# Patient Record
Sex: Female | Born: 1960 | Race: Black or African American | Hispanic: No | Smoking: Current every day smoker
Health system: Southern US, Community
[De-identification: ages and names within clinical notes are randomized; demographics above are authoritative.]

## PROBLEM LIST (undated history)

## (undated) DIAGNOSIS — Z720 Tobacco use: Secondary | ICD-10-CM

## (undated) DIAGNOSIS — I10 Essential (primary) hypertension: Secondary | ICD-10-CM

## (undated) DIAGNOSIS — K219 Gastro-esophageal reflux disease without esophagitis: Secondary | ICD-10-CM

## (undated) DIAGNOSIS — Z98891 History of uterine scar from previous surgery: Secondary | ICD-10-CM

## (undated) DIAGNOSIS — I2510 Atherosclerotic heart disease of native coronary artery without angina pectoris: Secondary | ICD-10-CM

---

## 1998-02-23 ENCOUNTER — Emergency Department (HOSPITAL_COMMUNITY): Admission: EM | Admit: 1998-02-23 | Discharge: 1998-02-23 | Payer: Self-pay | Admitting: Emergency Medicine

## 1999-07-18 ENCOUNTER — Ambulatory Visit (HOSPITAL_COMMUNITY): Admission: RE | Admit: 1999-07-18 | Discharge: 1999-07-18 | Payer: Self-pay | Admitting: Sports Medicine

## 1999-07-18 ENCOUNTER — Encounter: Payer: Self-pay | Admitting: Sports Medicine

## 2000-02-27 ENCOUNTER — Ambulatory Visit (HOSPITAL_COMMUNITY): Admission: RE | Admit: 2000-02-27 | Discharge: 2000-02-27 | Payer: Self-pay | Admitting: Sports Medicine

## 2000-02-27 ENCOUNTER — Encounter: Payer: Self-pay | Admitting: Sports Medicine

## 2000-07-26 ENCOUNTER — Emergency Department (HOSPITAL_COMMUNITY): Admission: EM | Admit: 2000-07-26 | Discharge: 2000-07-26 | Payer: Self-pay | Admitting: Emergency Medicine

## 2000-07-28 ENCOUNTER — Emergency Department (HOSPITAL_COMMUNITY): Admission: EM | Admit: 2000-07-28 | Discharge: 2000-07-28 | Payer: Self-pay | Admitting: Emergency Medicine

## 2001-02-03 ENCOUNTER — Emergency Department (HOSPITAL_COMMUNITY): Admission: EM | Admit: 2001-02-03 | Discharge: 2001-02-04 | Payer: Self-pay | Admitting: Emergency Medicine

## 2002-07-08 ENCOUNTER — Encounter: Payer: Self-pay | Admitting: Neurosurgery

## 2002-07-08 ENCOUNTER — Ambulatory Visit (HOSPITAL_COMMUNITY): Admission: RE | Admit: 2002-07-08 | Discharge: 2002-07-08 | Payer: Self-pay | Admitting: Neurosurgery

## 2002-08-06 ENCOUNTER — Emergency Department (HOSPITAL_COMMUNITY): Admission: EM | Admit: 2002-08-06 | Discharge: 2002-08-06 | Payer: Self-pay | Admitting: Emergency Medicine

## 2002-11-03 ENCOUNTER — Emergency Department (HOSPITAL_COMMUNITY): Admission: EM | Admit: 2002-11-03 | Discharge: 2002-11-03 | Payer: Self-pay | Admitting: Emergency Medicine

## 2002-11-12 ENCOUNTER — Other Ambulatory Visit: Admission: RE | Admit: 2002-11-12 | Discharge: 2002-11-12 | Payer: Self-pay | Admitting: Nephrology

## 2002-12-01 ENCOUNTER — Encounter: Payer: Self-pay | Admitting: Nephrology

## 2002-12-01 ENCOUNTER — Encounter: Admission: RE | Admit: 2002-12-01 | Discharge: 2002-12-01 | Payer: Self-pay | Admitting: Nephrology

## 2003-08-13 ENCOUNTER — Other Ambulatory Visit: Payer: Self-pay

## 2006-01-25 ENCOUNTER — Emergency Department: Payer: Self-pay | Admitting: Emergency Medicine

## 2006-08-24 ENCOUNTER — Emergency Department: Payer: Self-pay | Admitting: Emergency Medicine

## 2007-01-13 ENCOUNTER — Emergency Department: Payer: Self-pay | Admitting: Emergency Medicine

## 2008-08-13 ENCOUNTER — Emergency Department: Payer: Self-pay | Admitting: Unknown Physician Specialty

## 2009-03-10 ENCOUNTER — Emergency Department: Payer: Self-pay | Admitting: Emergency Medicine

## 2009-09-05 ENCOUNTER — Emergency Department (HOSPITAL_COMMUNITY): Admission: EM | Admit: 2009-09-05 | Discharge: 2009-09-05 | Payer: Self-pay | Admitting: Emergency Medicine

## 2010-03-07 ENCOUNTER — Emergency Department: Payer: Self-pay | Admitting: Emergency Medicine

## 2011-01-10 LAB — URINALYSIS, ROUTINE W REFLEX MICROSCOPIC
Bilirubin Urine: NEGATIVE
Glucose, UA: NEGATIVE mg/dL
Hgb urine dipstick: NEGATIVE
Ketones, ur: NEGATIVE mg/dL
Nitrite: NEGATIVE
Protein, ur: NEGATIVE mg/dL
Specific Gravity, Urine: 1.019 (ref 1.005–1.030)
Urobilinogen, UA: 0.2 mg/dL (ref 0.0–1.0)
pH: 5.5 (ref 5.0–8.0)

## 2011-01-10 LAB — BASIC METABOLIC PANEL
BUN: 9 mg/dL (ref 6–23)
CO2: 25 mEq/L (ref 19–32)
Chloride: 106 mEq/L (ref 96–112)
Creatinine, Ser: 0.9 mg/dL (ref 0.4–1.2)

## 2011-01-10 LAB — URINE CULTURE
Colony Count: NO GROWTH
Culture: NO GROWTH

## 2011-01-10 LAB — DIFFERENTIAL
Basophils Relative: 1 % (ref 0–1)
Eosinophils Absolute: 0.1 10*3/uL (ref 0.0–0.7)
Neutrophils Relative %: 64 % (ref 43–77)

## 2011-01-10 LAB — CBC
MCHC: 33.8 g/dL (ref 30.0–36.0)
MCV: 92.9 fL (ref 78.0–100.0)
Platelets: 265 10*3/uL (ref 150–400)
RDW: 14.4 % (ref 11.5–15.5)

## 2011-01-10 LAB — POCT CARDIAC MARKERS: Myoglobin, poc: 58.2 ng/mL (ref 12–200)

## 2011-06-07 ENCOUNTER — Emergency Department: Payer: Self-pay | Admitting: Emergency Medicine

## 2011-11-27 ENCOUNTER — Ambulatory Visit: Payer: Self-pay

## 2011-12-05 ENCOUNTER — Ambulatory Visit: Payer: Self-pay

## 2013-03-01 ENCOUNTER — Emergency Department: Payer: Self-pay | Admitting: Emergency Medicine

## 2013-07-07 ENCOUNTER — Emergency Department: Payer: Self-pay | Admitting: Emergency Medicine

## 2013-07-07 LAB — COMPREHENSIVE METABOLIC PANEL
Anion Gap: 6 — ABNORMAL LOW (ref 7–16)
Bilirubin,Total: 0.3 mg/dL (ref 0.2–1.0)
Calcium, Total: 8.7 mg/dL (ref 8.5–10.1)
Chloride: 107 mmol/L (ref 98–107)
EGFR (African American): >60
Glucose: 81 mg/dL (ref 65–99)
SGOT(AST): 24 U/L (ref 15–37)
SGPT (ALT): 22 U/L (ref 12–78)
Sodium: 138 mmol/L (ref 136–145)
Total Protein: 7 g/dL (ref 6.4–8.2)

## 2013-07-07 LAB — CK TOTAL AND CKMB (NOT AT ARMC): CK, Total: 159 U/L (ref 21–215)

## 2013-07-07 LAB — CBC
HGB: 14.7 g/dL (ref 12.0–16.0)
RBC: 4.71 10*6/uL (ref 3.80–5.20)
RDW: 13.6 % (ref 11.5–14.5)
WBC: 4.6 10*3/uL (ref 3.6–11.0)

## 2013-07-07 LAB — TROPONIN I: Troponin-I: <0.02 ng/mL

## 2014-03-01 ENCOUNTER — Emergency Department: Payer: Self-pay | Admitting: Emergency Medicine

## 2014-11-12 ENCOUNTER — Emergency Department: Payer: Self-pay | Admitting: Internal Medicine

## 2015-04-12 DIAGNOSIS — F17200 Nicotine dependence, unspecified, uncomplicated: Secondary | ICD-10-CM | POA: Insufficient documentation

## 2015-07-18 ENCOUNTER — Ambulatory Visit: Payer: Self-pay | Attending: Oncology

## 2015-11-12 ENCOUNTER — Emergency Department
Admission: EM | Admit: 2015-11-12 | Discharge: 2015-11-12 | Disposition: A | Discharge status: Home / Self Care | Payer: Self-pay | Attending: Emergency Medicine | Admitting: Emergency Medicine

## 2015-11-12 ENCOUNTER — Encounter: Payer: Self-pay | Admitting: Emergency Medicine

## 2015-11-12 DIAGNOSIS — I10 Essential (primary) hypertension: Secondary | ICD-10-CM | POA: Insufficient documentation

## 2015-11-12 DIAGNOSIS — J111 Influenza due to unidentified influenza virus with other respiratory manifestations: Secondary | ICD-10-CM | POA: Insufficient documentation

## 2015-11-12 DIAGNOSIS — F17200 Nicotine dependence, unspecified, uncomplicated: Secondary | ICD-10-CM | POA: Insufficient documentation

## 2015-11-12 HISTORY — DX: Essential (primary) hypertension: I10

## 2015-11-12 LAB — RAPID INFLUENZA A&B ANTIGENS (ARMC ONLY)
INFLUENZA A (ARMC): NOT DETECTED
INFLUENZA B (ARMC): NOT DETECTED

## 2015-11-12 MED ORDER — OSELTAMIVIR PHOSPHATE 75 MG PO CAPS: 75.0000 mg | ORAL_CAPSULE | Freq: Two times a day (BID) | ORAL | Status: AC

## 2015-11-12 MED ORDER — OSELTAMIVIR PHOSPHATE 75 MG PO CAPS
75.0000 mg | ORAL_CAPSULE | Freq: Once | ORAL | Status: AC
  Administered 2015-11-12: 75 mg via ORAL
  Filled 2015-11-12: qty 1

## 2015-11-12 MED ORDER — IBUPROFEN 800 MG PO TABS
800.0000 mg | ORAL_TABLET | Freq: Once | ORAL | Status: AC
  Administered 2015-11-12: 800 mg via ORAL
  Filled 2015-11-12: qty 1

## 2015-11-12 MED ORDER — HYDROCODONE-HOMATROPINE 5-1.5 MG/5ML PO SYRP: 5.0000 mL | ORAL_SOLUTION | Freq: Four times a day (QID) | ORAL | Status: DC | PRN

## 2015-11-12 MED ORDER — LISINOPRIL 10 MG PO TABS
20.0000 mg | ORAL_TABLET | Freq: Once | ORAL | Status: AC
  Administered 2015-11-12: 20 mg via ORAL
  Filled 2015-11-12: qty 2

## 2015-11-12 NOTE — Discharge Instructions (Signed)
Influenza, Adult Influenza ("the flu") is a viral infection of the respiratory tract. It occurs more often in winter months because people spend more time in close contact with one another. Influenza can make you feel very sick. Influenza easily spreads from person to person (contagious). CAUSES  Influenza is caused by a virus that infects the respiratory tract. You can catch the virus by breathing in droplets from an infected person's cough or sneeze. You can also catch the virus by touching something that was recently contaminated with the virus and then touching your mouth, nose, or eyes. RISKS AND COMPLICATIONS You may be at risk for a more severe case of influenza if you smoke cigarettes, have diabetes, have chronic heart disease (such as heart failure) or lung disease (such as asthma), or if you have a weakened immune system. Elderly people and pregnant women are also at risk for more serious infections. The most common problem of influenza is a lung infection (pneumonia). Sometimes, this problem can require emergency medical care and may be life threatening. SIGNS AND SYMPTOMS  Symptoms typically last 4 to 10 days and may include:  Fever.  Chills.  Headache, body aches, and muscle aches.  Sore throat.  Chest discomfort and cough.  Poor appetite.  Weakness or feeling tired.  Dizziness.  Nausea or vomiting. DIAGNOSIS  Diagnosis of influenza is often made based on your history and a physical exam. A nose or throat swab test can be done to confirm the diagnosis. TREATMENT  In mild cases, influenza goes away on its own. Treatment is directed at relieving symptoms. For more severe cases, your health care provider may prescribe antiviral medicines to shorten the sickness. Antibiotic medicines are not effective because the infection is caused by a virus, not by bacteria. HOME CARE INSTRUCTIONS  Take medicines only as directed by your health care provider.  Use a cool mist humidifier  to make breathing easier.  Get plenty of rest until your temperature returns to normal. This usually takes 3 to 4 days.  Drink enough fluid to keep your urine clear or pale yellow.  Cover yourmouth and nosewhen coughing or sneezing,and wash your handswellto prevent thevirusfrom spreading.  Stay homefromwork orschool untilthe fever is gonefor at least 29full day. PREVENTION  An annual influenza vaccination (flu shot) is the best way to avoid getting influenza. An annual flu shot is now routinely recommended for all adults in the [place] SEEK MEDICAL CARE IF:  You experiencechest pain, yourcough worsens,or you producemore mucus.  Youhave nausea,vomiting, ordiarrhea.  Your fever returns or gets worse. SEEK IMMEDIATE MEDICAL CARE IF:  You havetrouble breathing, you become short of breath,or your skin ornails becomebluish.  You have severe painor stiffnessin the neck.  You develop a sudden headache, or pain in the face or ear.  You have nausea or vomiting that you cannot control. MAKE SURE YOU:   Understand these instructions.  Will watch your condition.  Will get help right away if you are not doing well or get worse.   This information is not intended to replace advice given to you by your health care provider. Make sure you discuss any questions you have with your health care provider.   Document Released: 09/21/2000 Document Revised: 10/15/2014 Document Reviewed: 12/24/2011 Elsevier Interactive Patient Education 2016 [employer].  Hypertension Hypertension, commonly called high blood pressure, is when the force of blood pumping through your arteries is too strong. Your arteries are the blood vessels that carry blood from your heart throughout your  body. A blood pressure reading consists of a higher number over a lower number, such as 110/72. The higher number (systolic) is the pressure inside your arteries when your heart pumps. The lower number  (diastolic) is the pressure inside your arteries when your heart relaxes. Ideally you want your blood pressure below 120/80. Hypertension forces your heart to work harder to pump blood. Your arteries may become narrow or stiff. Having untreated or uncontrolled hypertension can cause heart attack, stroke, kidney disease, and other problems. RISK FACTORS Some risk factors for high blood pressure are controllable. Others are not.  Risk factors you cannot control include:   Race. You may be at higher risk if you are African American.  Age. Risk increases with age.  Gender. Men are at higher risk than women before age 4 years. After age 41, women are at higher risk than men. Risk factors you can control include:  Not getting enough exercise or physical activity.  Being overweight.  Getting too much fat, sugar, calories, or salt in your diet.  Drinking too much alcohol. SIGNS AND SYMPTOMS Hypertension does not usually cause signs or symptoms. Extremely high blood pressure (hypertensive crisis) may cause headache, anxiety, shortness of breath, and nosebleed. DIAGNOSIS To check if you have hypertension, your health care provider will measure your blood pressure while you are seated, with your arm held at the level of your heart. It should be measured at least twice using the same arm. Certain conditions can cause a difference in blood pressure between your right and left arms. A blood pressure reading that is higher than normal on one occasion does not mean that you need treatment. If it is not clear whether you have high blood pressure, you may be asked to return on a different day to have your blood pressure checked again. Or, you may be asked to monitor your blood pressure at home for 1 or more weeks. TREATMENT Treating high blood pressure includes making lifestyle changes and possibly taking medicine. Living a healthy lifestyle can help lower high blood pressure. You may need to change some of  your habits. Lifestyle changes may include:  Following the DASH diet. This diet is high in fruits, vegetables, and whole grains. It is low in salt, red meat, and added sugars.  Keep your sodium intake below 2,300 mg per day.  Getting at least 30-45 minutes of aerobic exercise at least 4 times per week.  Losing weight if necessary.  Not smoking.  Limiting alcoholic beverages.  Learning ways to reduce stress. Your health care provider may prescribe medicine if lifestyle changes are not enough to get your blood pressure under control, and if one of the following is true:  You are 68-72 years of age and your systolic blood pressure is above 140.  You are 18 years of age or older, and your systolic blood pressure is above 150.  Your diastolic blood pressure is above 90.  You have diabetes, and your systolic blood pressure is over 140 or your diastolic blood pressure is over 90.  You have kidney disease and your blood pressure is above 140/90.  You have heart disease and your blood pressure is above 140/90. Your personal target blood pressure may vary depending on your medical conditions, your age, and other factors. HOME CARE INSTRUCTIONS  Have your blood pressure rechecked as directed by your health care provider.   Take medicines only as directed by your health care provider. Follow the directions carefully. Blood pressure medicines must  be taken as prescribed. The medicine does not work as well when you skip doses. Skipping doses also puts you at risk for problems.  Do not smoke.   Monitor your blood pressure at home as directed by your health care provider. SEEK MEDICAL CARE IF:   You think you are having a reaction to medicines taken.  You have recurrent headaches or feel dizzy.  You have swelling in your ankles.  You have trouble with your vision. SEEK IMMEDIATE MEDICAL CARE IF:  You develop a severe headache or confusion.  You have unusual weakness, numbness,  or feel faint.  You have severe chest or abdominal pain.  You vomit repeatedly.  You have trouble breathing. MAKE SURE YOU:   Understand these instructions.  Will watch your condition.  Will get help right away if you are not doing well or get worse.   This information is not intended to replace advice given to you by your health care provider. Make sure you discuss any questions you have with your health care provider.   Document Released: 09/24/2005 Document Revised: 02/08/2015 Document Reviewed: 07/17/2013 Elsevier Interactive Patient Education 2016 [employer]   Continue fluid medicine as directed. You may use cough syrup as needed. Use ibuprofen for fever and headache. Follow-up with your physician next week if not improving or return to the emergency room for any worsening symptoms.

## 2015-11-12 NOTE — ED Provider Notes (Signed)
[facility] Emergency Department Provider Note  ____________________________________________  Time seen: Approximately 3:45 PM  I have reviewed the triage vital signs and the nursing notes.   HISTORY  Chief Complaint Nasal Congestion; Generalized Body Aches; and Cough    HPI DACIA CAPERS is a 55 y.o. female with history of hypertension who presents with 3 days of myalgias, fever, congestion and cough, as well as a headache. Occasional nausea but no vomiting or abdominal pain. She has taken her blood pressure medication. She goes to [facility].   Past Medical History  Diagnosis Date  . Hypertension     There are no active problems to display for this patient.   History reviewed. No pertinent past surgical history.  Current Outpatient Rx  Name  Route  Sig  Dispense  Refill  . HYDROcodone-homatropine (HYCODAN) 5-1.5 MG/5ML syrup   Oral   Take 5 mLs by mouth every 6 (six) hours as needed for cough.   120 mL   0   . oseltamivir (TAMIFLU) 75 MG capsule   Oral   Take 1 capsule (75 mg total) by mouth 2 (two) times daily.   10 capsule   0     Allergies Review of patient's allergies indicates no known allergies.  History reviewed. No pertinent family history.  Social History Social History  Substance Use Topics  . Smoking status: Current Every Day Smoker  . Smokeless tobacco: None  . Alcohol Use: No    Review of Systems Constitutional: No fever/chills Eyes: No visual changes. ENT: per  HPI Cardiovascular: Denies chest pain. Respiratory: MILD shortness of breath. Gastrointestinal: No abdominal pain. no vomiting.  No diarrhea.  No constipation. Genitourinary: Negative for dysuria. Musculoskeletal: Negative for back pain. Skin: Negative for rash. Neurological: Negative for focal weakness or numbness. 10-point ROS otherwise negative.  ____________________________________________   PHYSICAL EXAM:  VITAL SIGNS: ED  Triage Vitals  Enc Vitals Group     BP 11/12/15 1431 174/120 mmHg     Pulse Rate 11/12/15 1431 86     Resp 11/12/15 1431 20     Temp 11/12/15 1431 98.2 F (36.8 C)     Temp Source 11/12/15 1431 Oral     SpO2 11/12/15 1431 99 %     Weight 11/12/15 1431 130 lb (58.968 kg)     Height 11/12/15 1431  (1.6 m)     Head Cir --      Peak Flow --      Pain Score 11/12/15 1432 8     Pain Loc --      Pain Edu? --      Excl. in GC? --     Constitutional: Alert and oriented. Well appearing and in no acute distress. Eyes: Conjunctivae are normal. PERRL. EOMI. Ears:  Clear with normal landmarks. No erythema. Head: Atraumatic. Nose: No congestion/rhinnorhea. Mouth/Throat: Mucous membranes are moist.  Oropharynx non-erythematous. No lesions. Neck:  Supple.  No adenopathy.   Cardiovascular: Normal rate, regular rhythm. Grossly normal heart sounds.  Good peripheral circulation. Respiratory: Normal respiratory effort.  No retractions. Lungs CTAB. Gastrointestinal: Soft and nontender. No distention. No abdominal bruits. No CVA tenderness. Musculoskeletal: Nml ROM of upper and lower extremity joints. Neurologic:  Normal speech and language. No gross focal neurologic deficits are appreciated. No gait instability. Skin:  Skin is warm, dry and intact. No rash noted. Psychiatric: Mood and affect are normal. Speech and behavior are normal.  ____________________________________________   LABS (all labs ordered are  listed, but only abnormal results are displayed)  Labs Reviewed  RAPID INFLUENZA A&B ANTIGENS ([facility] ONLY)   ____________________________________________  EKG   ____________________________________________  RADIOLOGY    ____________________________________________   PROCEDURES  Procedure(s) performed: None  Critical Care performed: No  ____________________________________________   INITIAL IMPRESSION / ASSESSMENT AND PLAN / ED COURSE  Pertinent labs & imaging  results that were available during my care of the patient were reviewed by me and considered in my medical decision making (see chart for details).  55 year old female who presents with flulike symptoms for the last 3 days. The rapid flu test was negative though suspicion is still high and we'll treat with Tamiflu, ibuprofen. She also has hypertension and takes blood pressure medicine at home, lisinopril. She is given an extra dose of this while in the ER. She can follow-up with her primary physician or return to the emergency room for any worsening symptoms. ____________________________________________   FINAL CLINICAL IMPRESSION(S) / ED DIAGNOSES  Final diagnoses:  Influenza  Essential hypertension      Ignacia Bayley, PA-C 11/12/15 1701  Ignacia Bayley, PA-C 11/12/15 1725  Sharyn Creamer, MD 11/12/15 2253

## 2015-11-12 NOTE — ED Notes (Signed)
Pt to ed with c/o congestion, cough, headache, earache and body aches x 3 days.

## 2016-03-10 ENCOUNTER — Emergency Department: Payer: Self-pay

## 2016-03-10 ENCOUNTER — Encounter: Payer: Self-pay | Admitting: Emergency Medicine

## 2016-03-10 ENCOUNTER — Emergency Department
Admission: EM | Admit: 2016-03-10 | Discharge: 2016-03-10 | Disposition: A | Discharge status: Home / Self Care | Payer: Self-pay | Attending: Emergency Medicine | Admitting: Emergency Medicine

## 2016-03-10 DIAGNOSIS — R079 Chest pain, unspecified: Secondary | ICD-10-CM

## 2016-03-10 DIAGNOSIS — I871 Compression of vein: Secondary | ICD-10-CM | POA: Insufficient documentation

## 2016-03-10 DIAGNOSIS — I10 Essential (primary) hypertension: Secondary | ICD-10-CM | POA: Insufficient documentation

## 2016-03-10 DIAGNOSIS — F17200 Nicotine dependence, unspecified, uncomplicated: Secondary | ICD-10-CM | POA: Insufficient documentation

## 2016-03-10 LAB — CBC
HCT: 43.5 % (ref 35.0–47.0)
HEMOGLOBIN: 14.7 g/dL (ref 12.0–16.0)
MCH: 30.4 pg (ref 26.0–34.0)
MCHC: 33.7 g/dL (ref 32.0–36.0)
MCV: 90.3 fL (ref 80.0–100.0)
Platelets: 271 10*3/uL (ref 150–440)
RBC: 4.82 MIL/uL (ref 3.80–5.20)
RDW: 14.1 % (ref 11.5–14.5)
WBC: 3.8 10*3/uL (ref 3.6–11.0)

## 2016-03-10 LAB — BASIC METABOLIC PANEL
ANION GAP: 9 (ref 5–15)
BUN: 11 mg/dL (ref 6–20)
CALCIUM: 9.5 mg/dL (ref 8.9–10.3)
CO2: 24 mmol/L (ref 22–32)
Chloride: 106 mmol/L (ref 101–111)
Creatinine, Ser: 0.81 mg/dL (ref 0.44–1.00)
GLUCOSE: 103 mg/dL — AB (ref 65–99)
Potassium: 3.1 mmol/L — ABNORMAL LOW (ref 3.5–5.1)
Sodium: 139 mmol/L (ref 135–145)

## 2016-03-10 LAB — TROPONIN I: Troponin I: <0.03 ng/mL

## 2016-03-10 MED ORDER — ASPIRIN 81 MG PO CHEW
324.0000 mg | CHEWABLE_TABLET | Freq: Once | ORAL | Status: AC
  Administered 2016-03-10: 324 mg via ORAL
  Filled 2016-03-10: qty 4

## 2016-03-10 MED ORDER — TRAMADOL HCL 50 MG PO TABS: 50.0000 mg | ORAL_TABLET | Freq: Four times a day (QID) | ORAL | Status: AC | PRN

## 2016-03-10 MED ORDER — ASPIRIN EC 325 MG PO TBEC: 325.0000 mg | DELAYED_RELEASE_TABLET | Freq: Every day | ORAL | Status: DC

## 2016-03-10 MED ORDER — IOPAMIDOL (ISOVUE-370) INJECTION 76%
75.0000 mL | Freq: Once | INTRAVENOUS | Status: AC | PRN
  Administered 2016-03-10: 75 mL via INTRAVENOUS

## 2016-03-10 NOTE — ED Notes (Signed)
Discussed discharge instructions, prescriptions, and follow-up care with patient. No questions or concerns at this time. Pt stable at discharge.  

## 2016-03-10 NOTE — ED Provider Notes (Signed)
[facility] Emergency Department Provider Note        Time seen: ----------------------------------------- 5:30 PM on 03/10/2016 -----------------------------------------    I have reviewed the triage vital signs and the nursing notes.   HISTORY  Chief Complaint Chest Pain    HPI Robin Mckinney is a 55 y.o. female who presents to ER for back pain radiating into her chest and down her left arm for 2 weeks. Patient states pain is intermittent but has been worse today. She denies any shortness of breath. States that might be a muscle spasm but she wasn't sure. She denies fevers chills or other complaints, has never had this happen before. Denies any recent injuries or trauma.   Past Medical History  Diagnosis Date  . Hypertension     There are no active problems to display for this patient.   History reviewed. No pertinent past surgical history.  Allergies Review of patient's allergies indicates no known allergies.  Social History Social History  Substance Use Topics  . Smoking status: Current Every Day Smoker  . Smokeless tobacco: None  . Alcohol Use: No    Review of Systems Constitutional: Negative for fever. Eyes: Negative for visual changes. ENT: Negative for sore throat. Cardiovascular: Positive for chest pain Respiratory: Negative for shortness of breath. Gastrointestinal: Negative for abdominal pain, vomiting and diarrhea. Genitourinary: Negative for dysuria. Musculoskeletal: Positive for upper back pain Skin: Negative for rash. Neurological: Negative for headaches, focal weakness or numbness.  10-point ROS otherwise negative.  ____________________________________________   PHYSICAL EXAM:  VITAL SIGNS: ED Triage Vitals  Enc Vitals Group     BP 03/10/16 1341 153/118 mmHg     Pulse Rate 03/10/16 1341 75     Resp 03/10/16 1341 18     Temp 03/10/16 1341 98.2 F (36.8 C)     Temp Source 03/10/16 1341 Oral     SpO2  03/10/16 1341 97 %     Weight 03/10/16 1341 119 lb (53.978 kg)     Height 03/10/16 1341  (1.575 m)     Head Cir --      Peak Flow --      Pain Score --      Pain Loc --      Pain Edu? --      Excl. in GC? --     Constitutional: Alert and oriented. Well appearing and in no distress. Eyes: Conjunctivae are normal. PERRL. Normal extraocular movements. ENT   Head: Normocephalic and atraumatic.   Nose: No congestion/rhinnorhea.   Mouth/Throat: Mucous membranes are moist.   Neck: No stridor. Cardiovascular: Normal rate, regular rhythm. No murmurs, rubs, or gallops. Respiratory: Normal respiratory effort without tachypnea nor retractions. Breath sounds are clear and equal bilaterally. No wheezes/rales/rhonchi. Gastrointestinal: Soft and nontender. Normal bowel sounds Musculoskeletal: Nontender with normal range of motion in all extremities. No lower extremity tenderness nor edema. Neurologic:  Normal speech and language. No gross focal neurologic deficits are appreciated.  Skin:  Skin is warm, dry and intact. No rash noted. Psychiatric: Mood and affect are normal. Speech and behavior are normal.  ____________________________________________  EKG: Interpreted by me. Sinus rhythm with a rate of 76 bpm, normal PR interval, normal QRS, normal QT interval, left axis deviation, possible LVH.  ____________________________________________  ED COURSE:  Pertinent labs & imaging results that were available during my care of the patient were reviewed by me and considered in my medical decision making (see chart for details). Patient is in no  acute distress, will check cardiac labs and reevaluate.  ____________________________________________    LABS (pertinent positives/negatives)  Labs Reviewed  BASIC METABOLIC PANEL - Abnormal; Notable for the following:    Potassium 3.1 (*)    Glucose, Bld 103 (*)    All other components within normal limits  CBC  TROPONIN I  TROPONIN I     RADIOLOGY Images were viewed by me  Chest x-ray, CT angiogram of the chest IMPRESSION: No active cardiopulmonary disease.  IMPRESSION: No evidence of pulmonary embolus.  Mild cardiomegaly.  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  No acute findings. ____________________________________________  FINAL ASSESSMENT AND PLAN  Chest pain  Plan: Patient with labs and imaging as dictated above. Patient presented to the ER with left-sided chest pain of uncertain etiology. 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 I will place her on an aspirin daily and referred to vascular surgery for follow-up. There is no arterial occlusion or thrombosis. She is stable for outpatient follow-up.   Emily FilbertWilliams, Jonathan E, MD   Note: This dictation was prepared with Dragon dictation. Any transcriptional errors that result from this process are unintentional   Emily FilbertJonathan E Williams, MD 03/10/16 1759

## 2016-03-10 NOTE — ED Notes (Signed)
Patient presents to the ED with back pain radiating into patient's chest and down left arm x 2 weeks.  Patient states pain is intermittent but has been worse today.  Patient denies shortness of breath.  Patient states, I thought it might be a muscle spasm but I don't know."  Patient is in no obvious distress at this time.

## 2016-03-10 NOTE — Discharge Instructions (Signed)
Nonspecific Chest Pain  °Chest pain can be caused by many different conditions. There is always a chance that your pain could be related to something serious, such as a heart attack or a blood clot in your lungs. Chest pain can also be caused by conditions that are not life-threatening. If you have chest pain, it is very important to follow up with your health care provider. °CAUSES  °Chest pain can be caused by: °· Heartburn. °· Pneumonia or bronchitis. °· Anxiety or stress. °· Inflammation around your heart (pericarditis) or lung (pleuritis or pleurisy). °· A blood clot in your lung. °· A collapsed lung (pneumothorax). It can develop suddenly on its own (spontaneous pneumothorax) or from trauma to the chest. °· Shingles infection (varicella-zoster virus). °· Heart attack. °· Damage to the bones, muscles, and cartilage that make up your chest wall. This can include: °¨ Bruised bones due to injury. °¨ Strained muscles or cartilage due to frequent or repeated coughing or overwork. °¨ Fracture to one or more ribs. °¨ Sore cartilage due to inflammation (costochondritis). °RISK FACTORS  °Risk factors for chest pain may include: °· Activities that increase your risk for trauma or injury to your chest. °· Respiratory infections or conditions that cause frequent coughing. °· Medical conditions or overeating that can cause heartburn. °· Heart disease or family history of heart disease. °· Conditions or health behaviors that increase your risk of developing a blood clot. °· Having had chicken pox (varicella zoster). °SIGNS AND SYMPTOMS °Chest pain can feel like: °· Burning or tingling on the surface of your chest or deep in your chest. °· Crushing, pressure, aching, or squeezing pain. °· Dull or sharp pain that is worse when you move, cough, or take a deep breath. °· Pain that is also felt in your back, neck, shoulder, or arm, or pain that spreads to any of these areas. °Your chest pain may come and go, or it may stay  constant. °DIAGNOSIS °Lab tests or other studies may be needed to find the cause of your pain. Your health care provider may have you take a test called an ambulatory ECG (electrocardiogram). An ECG records your heartbeat patterns at the time the test is performed. You may also have other tests, such as: °· Transthoracic echocardiogram (TTE). During echocardiography, sound waves are used to create a picture of all of the heart structures and to look at how blood flows through your heart. °· Transesophageal echocardiogram (TEE). This is a more advanced imaging test that obtains images from inside your body. It allows your health care provider to see your heart in finer detail. °· Cardiac monitoring. This allows your health care provider to monitor your heart rate and rhythm in real time. °· Holter monitor. This is a portable device that records your heartbeat and can help to diagnose abnormal heartbeats. It allows your health care provider to track your heart activity for several days, if needed. °· Stress tests. These can be done through exercise or by taking medicine that makes your heart beat more quickly. °· Blood tests. °· Imaging tests. °TREATMENT  °Your treatment depends on what is causing your chest pain. Treatment may include: °· Medicines. These may include: °¨ Acid blockers for heartburn. °¨ Anti-inflammatory medicine. °¨ Pain medicine for inflammatory conditions. °¨ Antibiotic medicine, if an infection is present. °¨ Medicines to dissolve blood clots. °¨ Medicines to treat coronary artery disease. °· Supportive care for conditions that do not require medicines. This may include: °¨ Resting. °¨ Applying heat   or cold packs to injured areas. °¨ Limiting activities until pain decreases. °HOME CARE INSTRUCTIONS °· If you were prescribed an antibiotic medicine, finish it all even if you start to feel better. °· Avoid any activities that bring on chest pain. °· Do not use any tobacco products, including  cigarettes, chewing tobacco, or electronic cigarettes. If you need help quitting, ask your health care provider. °· Do not drink alcohol. °· Take medicines only as directed by your health care provider. °· Keep all follow-up visits as directed by your health care provider. This is important. This includes any further testing if your chest pain does not go away. °· If heartburn is the cause for your chest pain, you may be told to keep your head raised (elevated) while sleeping. This reduces the chance that acid will go from your stomach into your esophagus. °· Make lifestyle changes as directed by your health care provider. These may include: °¨ Getting regular exercise. Ask your health care provider to suggest some activities that are safe for you. °¨ Eating a heart-healthy diet. A registered dietitian can help you to learn healthy eating options. °¨ Maintaining a healthy weight. °¨ Managing diabetes, if necessary. °¨ Reducing stress. °SEEK MEDICAL CARE IF: °· Your chest pain does not go away after treatment. °· You have a rash with blisters on your chest. °· You have a fever. °SEEK IMMEDIATE MEDICAL CARE IF:  °· Your chest pain is worse. °· You have an increasing cough, or you cough up blood. °· You have severe abdominal pain. °· You have severe weakness. °· You faint. °· You have chills. °· You have sudden, unexplained chest discomfort. °· You have sudden, unexplained discomfort in your arms, back, neck, or jaw. °· You have shortness of breath at any time. °· You suddenly start to sweat, or your skin gets clammy. °· You feel nauseous or you vomit. °· You suddenly feel light-headed or dizzy. °· Your heart begins to beat quickly, or it feels like it is skipping beats. °These symptoms may represent a serious problem that is an emergency. Do not wait to see if the symptoms will go away. Get medical help right away. Call your local emergency services (911 in the [place] Do not drive yourself to the hospital. °  °This  information is not intended to replace advice given to you by your health care provider. Make sure you discuss any questions you have with your health care provider. °  °Document Released: 07/04/2005 Document Revised: 10/15/2014 Document Reviewed: 04/30/2014 °Elsevier Interactive Patient Education ©2016 [employer] ° °

## 2016-04-12 DIAGNOSIS — I82291 Chronic embolism and thrombosis of other thoracic veins: Secondary | ICD-10-CM | POA: Insufficient documentation

## 2017-08-08 ENCOUNTER — Encounter: Payer: Self-pay | Admitting: Emergency Medicine

## 2017-08-08 ENCOUNTER — Emergency Department: Payer: Medicaid Other

## 2017-08-08 ENCOUNTER — Emergency Department
Admission: EM | Admit: 2017-08-08 | Discharge: 2017-08-08 | Disposition: A | Discharge status: Home / Self Care | Payer: Medicaid Other | Attending: Emergency Medicine | Admitting: Emergency Medicine

## 2017-08-08 DIAGNOSIS — R079 Chest pain, unspecified: Secondary | ICD-10-CM | POA: Insufficient documentation

## 2017-08-08 DIAGNOSIS — F17200 Nicotine dependence, unspecified, uncomplicated: Secondary | ICD-10-CM | POA: Diagnosis not present

## 2017-08-08 DIAGNOSIS — I10 Essential (primary) hypertension: Secondary | ICD-10-CM | POA: Insufficient documentation

## 2017-08-08 DIAGNOSIS — Z7982 Long term (current) use of aspirin: Secondary | ICD-10-CM | POA: Diagnosis not present

## 2017-08-08 LAB — HEPATIC FUNCTION PANEL
ALBUMIN: 3.9 g/dL (ref 3.5–5.0)
ALT: 13 U/L — ABNORMAL LOW (ref 14–54)
AST: 24 U/L (ref 15–41)
Alkaline Phosphatase: 67 U/L (ref 38–126)
BILIRUBIN TOTAL: 1 mg/dL (ref 0.3–1.2)
Bilirubin, Direct: 0.1 mg/dL (ref 0.1–0.5)
Indirect Bilirubin: 0.9 mg/dL (ref 0.3–0.9)
Total Protein: 7.2 g/dL (ref 6.5–8.1)

## 2017-08-08 LAB — URINALYSIS, COMPLETE (UACMP) WITH MICROSCOPIC
Bacteria, UA: NONE SEEN
Bilirubin Urine: NEGATIVE
GLUCOSE, UA: NEGATIVE mg/dL
Hgb urine dipstick: NEGATIVE
KETONES UR: NEGATIVE mg/dL
Leukocytes, UA: NEGATIVE
Nitrite: NEGATIVE
PH: 5 (ref 5.0–8.0)
Protein, ur: NEGATIVE mg/dL
SPECIFIC GRAVITY, URINE: 1.013 (ref 1.005–1.030)

## 2017-08-08 LAB — BASIC METABOLIC PANEL
ANION GAP: 8 (ref 5–15)
BUN: 9 mg/dL (ref 6–20)
CO2: 26 mmol/L (ref 22–32)
Calcium: 9.2 mg/dL (ref 8.9–10.3)
Chloride: 106 mmol/L (ref 101–111)
Creatinine, Ser: 0.83 mg/dL (ref 0.44–1.00)
GLUCOSE: 89 mg/dL (ref 65–99)
Potassium: 3.6 mmol/L (ref 3.5–5.1)
Sodium: 140 mmol/L (ref 135–145)

## 2017-08-08 LAB — CBC
HCT: 40.6 % (ref 35.0–47.0)
HEMOGLOBIN: 13.6 g/dL (ref 12.0–16.0)
MCH: 30.6 pg (ref 26.0–34.0)
MCHC: 33.5 g/dL (ref 32.0–36.0)
MCV: 91.3 fL (ref 80.0–100.0)
PLATELETS: 272 10*3/uL (ref 150–440)
RBC: 4.45 MIL/uL (ref 3.80–5.20)
RDW: 13.7 % (ref 11.5–14.5)
WBC: 6.8 10*3/uL (ref 3.6–11.0)

## 2017-08-08 LAB — TROPONIN I

## 2017-08-08 LAB — LIPASE, BLOOD: Lipase: 21 U/L (ref 11–51)

## 2017-08-08 MED ORDER — SODIUM CHLORIDE 0.9 % IV BOLUS (SEPSIS)
500.0000 mL | Freq: Once | INTRAVENOUS | Status: AC
  Administered 2017-08-08: 500 mL via INTRAVENOUS

## 2017-08-08 NOTE — Discharge Instructions (Signed)

## 2017-08-08 NOTE — ED Triage Notes (Signed)
Pt reports generalized chest pain for three days. Pt reports associated vomiting. Pt reports had some cough a few days ago.

## 2017-08-08 NOTE — ED Provider Notes (Signed)
[facility] Emergency Department Provider Note  ____________________________________________  Time seen: Approximately 11:59 AM  I have reviewed the triage vital signs and the nursing notes.   HISTORY  Chief Complaint Chest Pain   HPI Robin Mckinney is a 56 y.o. 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 Patient reports 3-4 days of intermittent episodes of chest pain that she describes as a squeezing pain involving her entire chest, upper abdomen, and bilateral flanks. She has been having several episodes a day. When the pain comes on is severe and it lasts several minutes and then it resolves without intervention. Unknonw provoking factors.  She had one episode of nausea and nonbloody nonbilious emesis with it. No shortness of breath. She does have a cough that is productive of clear phlegm. No abdominal pain, no diarrhea, no dysuria, no fever or chills, no shortness of breath, no headache. Patient has no pain at this time but had another episode this morning. She denies dysuria but reports urinary frequency.  Past Medical History:  Diagnosis Date  . Hypertension     There are no active problems to display for this patient.   No past surgical history on file.  Prior to Admission medications   Medication Sig Start Date End Date Taking? Authorizing Provider  aspirin EC 325 MG tablet Take 1 tablet (325 mg total) by mouth daily. 03/10/16   Emily FilbertWilliams, Jonathan E, MD  HYDROcodone-homatropine [facility](HYCODAN) 5-1.5 MG/5ML syrup Take 5 mLs by mouth every 6 (six) hours as needed for cough. 11/12/15   Ignacia Bayleyumey, Robert, PA-C    Allergies Patient has no known allergies.  Family History  Hypertension Brother    Hypertension Father    Cancer Mother  leukemia  Hypertension Mother       Social History Social History  Substance Use Topics  . Smoking status: Current Every Day  Smoker  . Smokeless tobacco: Not on file  . Alcohol use No    Review of Systems  Constitutional: Negative for fever. Eyes: Negative for visual changes. ENT: Negative for sore throat. Neck: No neck pain  Cardiovascular: + chest pain. Respiratory: Negative for shortness of breath + cough Gastrointestinal: Negative for abdominal pain,  Diarrhea. + N.V Genitourinary: Negative for dysuria. + b/l flank pain and frequency Musculoskeletal: Negative for back pain. Skin: Negative for rash. Neurological: Negative for headaches, weakness or numbness. Psych: No SI or HI  ____________________________________________   PHYSICAL EXAM:  VITAL SIGNS: ED Triage Vitals  Enc Vitals Group     BP 08/08/17 1120 (!) 185/99     Pulse Rate 08/08/17 1120 62     Resp 08/08/17 1120 18     Temp 08/08/17 1120 97.6 F (36.4 C)     Temp Source 08/08/17 1120 Oral     SpO2 08/08/17 1120 100 %     Weight 08/08/17 1116 140 lb (63.5 kg)     Height 08/08/17 1116 5\' 4"  (1.626 m)     Head Circumference --      Peak Flow --      Pain Score 08/08/17 1116 8     Pain Loc --      Pain Edu? --      Excl. in GC? --     Constitutional: Alert and oriented. Well appearing and in no apparent distress. HEENT:      Head: Normocephalic and atraumatic.  Eyes: Conjunctivae are normal. Sclera is non-icteric.       Mouth/Throat: Mucous membranes are moist.       Neck: Supple with no signs of meningismus. Cardiovascular: Regular rate and rhythm. No murmurs, gallops, or rubs. 2+ symmetrical distal pulses are present in all extremities. No JVD. Respiratory: Normal respiratory effort. Lungs are clear to auscultation bilaterally. No wheezes, crackles, or rhonchi.  Gastrointestinal: Soft, non tender, and non distended with positive bowel sounds. No rebound or guarding. Genitourinary: No CVA tenderness. Musculoskeletal: Nontender with normal range of motion in all extremities. No edema, cyanosis, or erythema of  extremities. Neurologic: Normal speech and language. Face is symmetric. Moving all extremities. No gross focal neurologic deficits are appreciated. Skin: Skin is warm, dry and intact. No rash noted. Psychiatric: Mood and affect are normal. Speech and behavior are normal.  ____________________________________________   LABS (all labs ordered are listed, but only abnormal results are displayed)  Labs Reviewed  URINALYSIS, COMPLETE (UACMP) WITH MICROSCOPIC - Abnormal; Notable for the following:       Result Value   Color, Urine YELLOW (*)    APPearance CLEAR (*)    Squamous Epithelial / LPF 0-5 (*)    All other components within normal limits  HEPATIC FUNCTION PANEL - Abnormal; Notable for the following:    ALT 13 (*)    All other components within normal limits  BASIC METABOLIC PANEL  CBC  TROPONIN I  LIPASE, BLOOD  TROPONIN I   ____________________________________________  EKG  ED ECG REPORT I, Nita Sickle, the attending physician, personally viewed and interpreted this ECG.  Normal sinus rhythm, rate of 68, normal intervals, left axis deviation, no ST elevations or depressions, diffuse T-wave flattening. no significant changes when compared to prior from 2017. ____________________________________________  RADIOLOGY  CXR:  Mild right basilar subsegmental atelectasis or scarring. No other abnormality seen in the chest. ____________________________________________   PROCEDURES  Procedure(s) performed: None Procedures Critical Care performed:  None ____________________________________________   INITIAL IMPRESSION / ASSESSMENT AND PLAN / ED COURSE  56 y.o. 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 patient is well-appearing, in no  distress, neurologically intact, strong equal pulses in all 4 extremities, lungs are clear to auscultation with no wheezing or crackles, abdomen is soft with no tenderness throughout. Chest x-ray with no evidence of pneumonia or pneumothorax. We'll check LFTs and lipase to rule out gallbladder etiology or pancreatitis. We'll check UA to rule out UTI versus pyelonephritis. Low suspicion for ACS based on the description of the pain. Patient does have an EKG with no ischemic changes. We'll get 2 sets of cardiac enzymes. Will monitor on telemetry. We'll reassess patient has any more episodes of pain here.  ----------------------------------------- 11:49 AM on 08/08/2017 ----------------------------------------- OBSERVATION CARE: This patient is being placed under observation care for the following reasons: Chest pain with repeat testing to rule out ischemia   ----------------------------------------- 1:49 PM on 08/08/2017 ----------------------------------------- Patient remains well appearing. No episodes of pain in the ED. Labs WNL. 2nd troponin due soon. Will continue to monitor  ----------------------------------------- 3:50 PM on 08/08/2017 -----------------------------------------  END OF OBSERVATION STATUS: After an appropriate period of observation, this patient is being discharged due to the following reason(s):  patient with no episodes of pain in the emergency department. Troponin 2 was negative.  Labs including LFT, lipase, CBC, and BMP all within findings.UA with no evidence of urinary tract infection.chest x-ray with no evidence of pneumonia. Since patient remains asymptomatic, with normal vitals and blood work I feel that is safe for patient to be discharged at this time. Recommended close follow-up with PCP and will provide patient with a referral to go see cardiology for these episodes of chest pain. Recommended return to the emergency room for any new episodes of chest pain or  abdominal pain. Discussed return precautions.   As part of my medical decision making, I reviewed the following data within the electronic MEDICAL RECORD NUMBER Nursing notes reviewed and incorporated, Labs reviewed , EKG interpreted , Old EKG reviewed, Radiograph reviewed , Notes from prior ED visits and [place] Controlled Substance Database    Pertinent labs & imaging results that were available during my care of the patient were reviewed by me and considered in my medical decision making (see chart for details).    ____________________________________________   FINAL CLINICAL IMPRESSION(S) / ED DIAGNOSES  Final diagnoses:  Chest pain, unspecified type      NEW MEDICATIONS STARTED DURING THIS VISIT:  New Prescriptions   No medications on file     Note:  This document was prepared using Dragon voice recognition software and may include unintentional dictation errors.    Nita Sickle, MD 08/08/17 [phone]

## 2017-10-17 DIAGNOSIS — R079 Chest pain, unspecified: Secondary | ICD-10-CM | POA: Insufficient documentation

## 2017-10-17 DIAGNOSIS — I10 Essential (primary) hypertension: Secondary | ICD-10-CM | POA: Insufficient documentation

## 2017-10-17 NOTE — Progress Notes (Signed)
Cardiology Office Note  Date:  10/18/2017   ID:  Robin Mckinney, DOB 12-13-1960, MRN [id number]  PCP:  Sandrea Hughsubio, Jessica, NP   Chief Complaint  Patient presents with  . Other    New patient. Patient c/o SOB, swelling in knee, numbness in left hand. Meds reviewed verbally with patient.     HPI:  57 yo woman with history of  hypertension  Smoker, 1 pack in 2 weeks chronic thombosis of brachiocephalic vein  Present by referral form the ER for evaluation of chest pain.   Seen in the ER 08/08/2017 3-4 days of intermittent episodes of chest pain  squeezing pain involving her entire chest, upper abdomen, and bilateral flanks. several episodes a day.   lasts several minutes and then it resolves without intervention.  one episode of nausea and nonbloody nonbilious emesis with it.   No shortness of breath.  he does have a cough that is productive of clear phlegm. No abdominal pain, no diarrhea, no dysuria, no fever or chills, no shortness of breath, no headache. Patient has no pain at this time but had another episode this morning. She denies dysuria but reports urinary frequency.  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 Images pulled up in the office, mild coronary calcification noted mid LAD, proximal RCA  no aortic atherosclerosis noted  EKG personally reviewed by myself on todays visit Shows normal sinus rhythm rate 60 bpm no significant ST or T wave changes   PMH:   has a past medical history of Hypertension.  Smoker   PSH:   History reviewed. No pertinent surgical history.  Current Outpatient Medications  Medication Sig Dispense Refill  . aspirin EC 325 MG tablet Take 1 tablet (325 mg total) by mouth daily. 100 tablet 3  . lisinopril-hydrochlorothiazide (PRINZIDE,ZESTORETIC) 10-12.5 MG tablet Take 1 tablet by mouth  daily.     No current facility-administered medications for this visit.      Allergies:   Patient has no known allergies.   Social History:  The patient  reports that she has been smoking.  She has been smoking about 0.50 packs per day. she has never used smokeless tobacco. She reports that she does not drink alcohol or use drugs.   Family History:   family history is not on file.    Review of Systems: Review of Systems  Constitutional: Negative.   Respiratory: Negative.   Cardiovascular: Positive for chest pain.  Gastrointestinal: Negative.   Musculoskeletal: Negative.   Neurological: Negative.        Numbness tingling left hand left foot toes  Psychiatric/Behavioral: Negative.   All other systems reviewed and are negative.    PHYSICAL EXAM: VS:  BP 112/70 (BP Location: Right Arm, Patient Position: Sitting, Cuff Size: Normal)   Pulse 60   Ht 5\' 4"  (1.626 m)   Wt 147 lb (66.7 kg)   BMI 25.23 kg/m  , BMI Body mass index is 25.23 kg/m. GEN: Well nourished, well developed, in no acute distress  HEENT: normal  Neck: no JVD, carotid bruits, or masses Cardiac: RRR; no murmurs, rubs, or gallops,no edema  Respiratory:  clear to auscultation bilaterally, normal work of breathing GI: soft, nontender, nondistended, + BS MS: no deformity or atrophy  Skin: warm and dry, no rash Neuro:  Strength and sensation are intact  Psych: euthymic mood, full affect    Recent Labs: 08/08/2017: ALT 13; BUN 9; Creatinine, Ser 0.83; Hemoglobin 13.6; Platelets 272; Potassium 3.6; Sodium 140    Lipid Panel No results found for: CHOL, HDL, LDLCALC, TRIG    Wt Readings from Last 3 Encounters:  10/18/17 147 lb (66.7 kg)  08/08/17 140 lb (63.5 kg)  03/10/16 119 lb (54 kg)       ASSESSMENT AND PLAN:  Chest pain, unspecified type - Plan: EKG 12-Lead, Exercise Tolerance Test Atypical pain beginning of November 2018 None since that time even with exertion She feels it was secondary to  stress She does have several risk factors including smoking,  There is coronary calcification seen on CT scan chest 2017  recommend routine treadmill study. If she has recurrence of her chest pain symptoms recommend she call our office  Benign essential HTN - Plan: EKG 12-Lead, Exercise Tolerance Test Blood pressure is well controlled on today's visit. No changes made to the medications. Currently on lisinopril HCTZ 10/12.5 mg daily  Numbness and tingling of left hand Long discussion, symptoms concerning for carpal tunnel High use of her hands and wrists at work  symptoms worse in the morning Recommend she try wrist splint  Smoker We have encouraged her to continue to work on weaning her cigarettes and smoking cessation. She will continue to work on this and does not want any assistance with chantix.   Coronary artery calcification CT scan images pulled up and discussed with her in detail Mild LAD and RCA calcification Recommended smoking cessation Cholesterol management per primary care  Disposition:   F/U as needed   Total encounter time more than 45 minutes  Greater than 50% was spent in counseling and coordination of care with the patient    Orders Placed This Encounter  Procedures  . Exercise Tolerance Test  . EKG 12-Lead     Signed, Dossie Arbour, M.D., Ph.D. 10/18/2017  [facility] [place], [place] [phone]

## 2017-10-18 ENCOUNTER — Ambulatory Visit (INDEPENDENT_AMBULATORY_CARE_PROVIDER_SITE_OTHER): Payer: Self-pay | Admitting: Cardiovascular Disease

## 2017-10-18 ENCOUNTER — Encounter: Payer: Self-pay | Admitting: Cardiovascular Disease

## 2017-10-18 DIAGNOSIS — R202 Paresthesia of skin: Secondary | ICD-10-CM | POA: Insufficient documentation

## 2017-10-18 DIAGNOSIS — F17200 Nicotine dependence, unspecified, uncomplicated: Secondary | ICD-10-CM

## 2017-10-18 DIAGNOSIS — R079 Chest pain, unspecified: Secondary | ICD-10-CM

## 2017-10-18 DIAGNOSIS — I2510 Atherosclerotic heart disease of native coronary artery without angina pectoris: Secondary | ICD-10-CM | POA: Insufficient documentation

## 2017-10-18 DIAGNOSIS — R200 Anesthesia of skin: Secondary | ICD-10-CM

## 2017-10-18 DIAGNOSIS — I2584 Coronary atherosclerosis due to calcified coronary lesion: Secondary | ICD-10-CM

## 2017-10-18 DIAGNOSIS — I10 Essential (primary) hypertension: Secondary | ICD-10-CM

## 2017-10-18 NOTE — Patient Instructions (Addendum)
Medication Instructions:   No medication changes made  Labwork:  No new labs needed  Testing/Procedures:  We will order a treadmill stress test (GXT) in the office for chest pain, coronary calcification on CT scan in 2017   Do not drink or eat foods with caffeine for 24 hours before the test. (Chocolate, coffee, tea, or energy drinks)  If you use an inhaler, bring it with you to the test.  Do not smoke for 4 hours before the test.  Wear comfortable shoes and clothing.  Follow-Up: It was a pleasure seeing you in the office today. Please call us if you have new issues that need to be addressed before your next appt.  [phone]  Your physician wants you to follow-up in: As Needed. We will call you with results of your testing.    If you need a refill on your cardiac medications before your next appointment, please call your pharmacy.

## 2017-10-21 ENCOUNTER — Ambulatory Visit (INDEPENDENT_AMBULATORY_CARE_PROVIDER_SITE_OTHER): Payer: Self-pay

## 2017-10-21 DIAGNOSIS — R079 Chest pain, unspecified: Secondary | ICD-10-CM

## 2017-10-21 DIAGNOSIS — I10 Essential (primary) hypertension: Secondary | ICD-10-CM

## 2017-10-23 LAB — EXERCISE TOLERANCE TEST
CSEPEDS: 58 s
CSEPEW: 4.5 METS
CSEPPHR: 110 {beats}/min
Exercise duration (min): 1 min
MPHR: 163 {beats}/min
Percent HR: 67 %
Rest HR: 66 {beats}/min

## 2017-10-28 ENCOUNTER — Telehealth: Payer: Self-pay | Admitting: Cardiovascular Disease

## 2017-10-28 ENCOUNTER — Other Ambulatory Visit: Payer: Self-pay | Admitting: *Deleted

## 2017-10-28 DIAGNOSIS — R079 Chest pain, unspecified: Secondary | ICD-10-CM

## 2017-10-28 NOTE — Telephone Encounter (Signed)
Notes recorded by Antonieta IbaGollan, Timothy J, MD on 10/28/2017 at 8:37 AM EST Stress test Suboptimal study as she was not able to achieve target heart rate Would recommend pharmacological Myoview which does not involve a treadmill Perform for chest pain symptoms  The patient is aware of her GXT results. She is agreeable with myoview testing at this time. All instructions reviewed with the patient by phone- she verbalized understanding.   [facility] MYOVIEW  Your caregiver has ordered a Stress Test with nuclear imaging. The purpose of this test is to evaluate the blood supply to your heart muscle. This procedure is referred to as a "Non-Invasive Stress Test." This is because other than having an IV started in your vein, nothing is inserted or "invades" your body. Cardiac stress tests are done to find areas of poor blood flow to the heart by determining the extent of coronary artery disease (CAD). Some patients exercise on a treadmill, which naturally increases the blood flow to your heart, while others who are  unable to walk on a treadmill due to physical limitations have a pharmacologic/chemical stress agent called Lexiscan . This medicine will mimic walking on a treadmill by temporarily increasing your coronary blood flow.   Please note: these test may take anywhere between 2-4 hours to complete  PLEASE REPORT TO [facility]  THE VOLUNTEERS AT THE FIRST DESK WILL DIRECT YOU WHERE TO GO  Date of Procedure:_________Wednesday 1/23/19____________________________  Arrival Time for Procedure:______9:15 am________________________  Instructions regarding medication:  - You may take your regular medications the morning of your of your procedure with enough water to get them down safely  PLEASE NOTIFY THE OFFICE AT LEAST 24 HOURS IN ADVANCE IF YOU ARE UNABLE TO KEEP YOUR APPOINTMENT.  [phone] AND  PLEASE NOTIFY NUCLEAR MEDICINE AT [facility] AT LEAST 24 HOURS IN ADVANCE IF YOU ARE UNABLE TO KEEP YOUR  APPOINTMENT. [phone]  How to prepare for your Myoview test:  1. Do not eat or drink after midnight 2. No caffeine for 24 hours prior to test 3. No smoking 24 hours prior to test. 4. Your medication may be taken with water.  If your doctor stopped a medication because of this test, do not take that medication. 5. Ladies, please do not wear dresses.  Skirts or pants are appropriate. Please wear a short sleeve shirt. 6. No perfume, cologne or lotion. 7. Wear comfortable walking shoes. No heels!

## 2017-10-30 ENCOUNTER — Ambulatory Visit
Admission: RE | Admit: 2017-10-30 | Discharge: 2017-10-30 | Disposition: A | Discharge status: Home / Self Care | Payer: Medicaid Other | Source: Ambulatory Visit | Attending: Cardiovascular Disease | Admitting: Cardiovascular Disease

## 2017-10-30 DIAGNOSIS — R079 Chest pain, unspecified: Secondary | ICD-10-CM | POA: Diagnosis not present

## 2017-10-30 LAB — NM MYOCAR MULTI W/SPECT W/WALL MOTION / EF
CSEPHR: 57 %
CSEPPHR: 93 {beats}/min
LV dias vol: 36 mL (ref 46–106)
LVSYSVOL: 9 mL
NUC STRESS TID: 1
Rest HR: 55 {beats}/min

## 2017-10-30 MED ORDER — TECHNETIUM TC 99M TETROFOSMIN IV KIT
32.5800 | PACK | Freq: Once | INTRAVENOUS | Status: AC | PRN
  Administered 2017-10-30: 32.58 via INTRAVENOUS

## 2017-10-30 MED ORDER — REGADENOSON 0.4 MG/5ML IV SOLN
0.4000 mg | Freq: Once | INTRAVENOUS | Status: AC
  Administered 2017-10-30: 0.4 mg via INTRAVENOUS

## 2017-10-30 MED ORDER — TECHNETIUM TC 99M TETROFOSMIN IV KIT
14.1100 | PACK | Freq: Once | INTRAVENOUS | Status: AC | PRN
Start: 2017-10-30 — End: 2017-10-30
  Administered 2017-10-30: 14.11 via INTRAVENOUS

## 2018-09-17 DIAGNOSIS — M79602 Pain in left arm: Secondary | ICD-10-CM | POA: Insufficient documentation

## 2019-03-03 ENCOUNTER — Encounter: Payer: Self-pay | Admitting: Emergency Medicine

## 2019-03-03 ENCOUNTER — Emergency Department: Payer: Medicaid Other

## 2019-03-03 ENCOUNTER — Other Ambulatory Visit: Payer: Self-pay

## 2019-03-03 ENCOUNTER — Emergency Department
Admission: EM | Admit: 2019-03-03 | Discharge: 2019-03-03 | Disposition: A | Discharge status: Home / Self Care | Payer: Medicaid Other | Attending: Emergency Medicine | Admitting: Emergency Medicine

## 2019-03-03 DIAGNOSIS — Z79899 Other long term (current) drug therapy: Secondary | ICD-10-CM | POA: Diagnosis not present

## 2019-03-03 DIAGNOSIS — R0789 Other chest pain: Secondary | ICD-10-CM | POA: Insufficient documentation

## 2019-03-03 DIAGNOSIS — Z7982 Long term (current) use of aspirin: Secondary | ICD-10-CM | POA: Diagnosis not present

## 2019-03-03 DIAGNOSIS — I2510 Atherosclerotic heart disease of native coronary artery without angina pectoris: Secondary | ICD-10-CM | POA: Insufficient documentation

## 2019-03-03 DIAGNOSIS — F17210 Nicotine dependence, cigarettes, uncomplicated: Secondary | ICD-10-CM | POA: Diagnosis not present

## 2019-03-03 DIAGNOSIS — I10 Essential (primary) hypertension: Secondary | ICD-10-CM | POA: Diagnosis not present

## 2019-03-03 DIAGNOSIS — R079 Chest pain, unspecified: Secondary | ICD-10-CM

## 2019-03-03 LAB — BASIC METABOLIC PANEL
Anion gap: 8 (ref 5–15)
BUN: 16 mg/dL (ref 6–20)
CO2: 25 mmol/L (ref 22–32)
Calcium: 9.1 mg/dL (ref 8.9–10.3)
Chloride: 107 mmol/L (ref 98–111)
Creatinine, Ser: 1 mg/dL (ref 0.44–1.00)
GFR calc Af Amer: >60 mL/min (ref >60)
GFR calc non Af Amer: >60 mL/min (ref >60)
Glucose, Bld: 103 mg/dL — ABNORMAL HIGH (ref 70–99)
Potassium: 3.6 mmol/L (ref 3.5–5.1)
Sodium: 140 mmol/L (ref 135–145)

## 2019-03-03 LAB — TROPONIN I
Troponin I: <0.03 ng/mL (ref <0.03)
Troponin I: <0.03 ng/mL (ref <0.03)

## 2019-03-03 LAB — CBC
HCT: 44 % (ref 36.0–46.0)
Hemoglobin: 14 g/dL (ref 12.0–15.0)
MCH: 29.9 pg (ref 26.0–34.0)
MCHC: 31.8 g/dL (ref 30.0–36.0)
MCV: 94 fL (ref 80.0–100.0)
Platelets: 280 10*3/uL (ref 150–400)
RBC: 4.68 MIL/uL (ref 3.87–5.11)
RDW: 13.5 % (ref 11.5–15.5)
WBC: 5.8 10*3/uL (ref 4.0–10.5)
nRBC: 0 % (ref 0.0–0.2)

## 2019-03-03 MED ORDER — ASPIRIN 81 MG PO CHEW
324.0000 mg | CHEWABLE_TABLET | Freq: Once | ORAL | Status: AC
  Administered 2019-03-03: 324 mg via ORAL
  Filled 2019-03-03: qty 4

## 2019-03-03 MED ORDER — ALUMINUM-MAGNESIUM-SIMETHICONE 200-200-20 MG/5ML PO SUSP: 30.0000 mL | Freq: Three times a day (TID) | ORAL | 0 refills | Status: DC

## 2019-03-03 MED ORDER — ASPIRIN EC 325 MG PO TBEC: 325.0000 mg | DELAYED_RELEASE_TABLET | Freq: Every day | ORAL | 2 refills | Status: DC

## 2019-03-03 MED ORDER — KETOROLAC TROMETHAMINE 30 MG/ML IJ SOLN
15.0000 mg | INTRAMUSCULAR | Status: AC
  Administered 2019-03-03: 15 mg via INTRAVENOUS
  Filled 2019-03-03: qty 1

## 2019-03-03 MED ORDER — LISINOPRIL-HYDROCHLOROTHIAZIDE 10-12.5 MG PO TABS: 1.0000 | ORAL_TABLET | Freq: Every day | ORAL | 0 refills | Status: DC

## 2019-03-03 MED ORDER — FAMOTIDINE 20 MG PO TABS: 20.0000 mg | ORAL_TABLET | Freq: Two times a day (BID) | ORAL | 0 refills | Status: DC

## 2019-03-03 MED ORDER — IOHEXOL 350 MG/ML SOLN
75.0000 mL | Freq: Once | INTRAVENOUS | Status: AC | PRN
  Administered 2019-03-03: 75 mL via INTRAVENOUS
  Filled 2019-03-03: qty 75

## 2019-03-03 MED ORDER — FAMOTIDINE 20 MG PO TABS
40.0000 mg | ORAL_TABLET | Freq: Once | ORAL | Status: AC
  Administered 2019-03-03: 40 mg via ORAL
  Filled 2019-03-03: qty 2

## 2019-03-03 MED ORDER — HYDROCHLOROTHIAZIDE 12.5 MG PO CAPS
12.5000 mg | ORAL_CAPSULE | Freq: Every day | ORAL | Status: DC
  Administered 2019-03-03: 12.5 mg via ORAL
  Filled 2019-03-03 (×2): qty 1

## 2019-03-03 MED ORDER — LISINOPRIL 10 MG PO TABS
10.0000 mg | ORAL_TABLET | Freq: Once | ORAL | Status: AC
  Administered 2019-03-03: 10 mg via ORAL
  Filled 2019-03-03: qty 1

## 2019-03-03 NOTE — ED Provider Notes (Signed)
[facility] Emergency Department Provider Note  ____________________________________________  Time seen: Approximately 3:16 PM  I have reviewed the triage vital signs and the nursing notes.   HISTORY  Chief Complaint Chest Pain and Shortness of Breath    HPI Robin Mckinney is a 58 y.o. 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  Not exertional, not pleuritic.  Not affected by eating or position.  Denies fevers chills body aches sick contacts vomiting diarrhea belly pain.  Nonradiating.  Is like her prior pain that has been attributed to her brachiocephalic/brachial plexus syndrome for which she has been referred to physical therapy.     Past Medical History:  Diagnosis Date  . Hypertension      Patient Active Problem List   Diagnosis Date Noted  . Numbness and tingling of left hand 10/18/2017  . Smoker 10/18/2017  . Coronary artery calcification 10/18/2017  . Chest pain 10/17/2017  . Benign essential HTN 10/17/2017     History reviewed. No pertinent surgical history.   Prior to Admission medications   Medication Sig Start Date End Date Taking? Authorizing Provider  aluminum-magnesium hydroxide-simethicone (MAALOX) 200-200-20 MG/5ML SUSP Take 30 mLs by mouth 4 (four) times daily -  before meals and at bedtime. 03/03/19   Sharman Cheek, MD  aspirin EC 325 MG tablet Take 1 tablet (325 mg total) by mouth daily. 03/03/19   Sharman Cheek, MD  famotidine (PEPCID) 20 MG tablet Take 1 tablet (20 mg total) by mouth 2 (two) times daily. 03/03/19   Sharman Cheek, MD  lisinopril-hydrochlorothiazide (PRINZIDE,ZESTORETIC) 10-12.5 MG tablet Take 1 tablet by mouth daily.    [provider]  lisinopril-hydrochlorothiazide (ZESTORETIC) 10-12.5 MG tablet Take 1 tablet by mouth daily for 30 days. 03/03/19 04/02/19  Sharman Cheek, MD     Allergies Patient has no known allergies.   No family history on file.  Social History Social History   Tobacco Use  . Smoking status: Current Every Day Smoker    Packs/day: 0.50  . Smokeless tobacco: Never Used  Substance Use Topics  . Alcohol use: No  . Drug use: No    Review of Systems  Constitutional:   No fever or chills.  ENT:   No sore throat. No rhinorrhea. Cardiovascular: Positive as above chest pain without syncope. Respiratory:   No dyspnea or cough. Gastrointestinal:   Negative for abdominal pain, vomiting and diarrhea.  Musculoskeletal:   Negative for focal pain or swelling All other systems reviewed and are negative except as documented above in ROS and HPI.  ____________________________________________   PHYSICAL EXAM:  VITAL SIGNS: ED Triage Vitals  Enc Vitals Group     BP 03/03/19 1117 (!) 158/128     Pulse Rate 03/03/19 1117 83     Resp 03/03/19 1117 20     Temp 03/03/19 1117 98.3 F (36.8 C)     Temp Source 03/03/19 1117 Oral     SpO2 03/03/19 1117 100 %     Weight 03/03/19 1118 150 lb (68 kg)     Height 03/03/19 1118 5\' 3"  (1.6 m)     Head Circumference --      Peak Flow --      Pain Score 03/03/19 1118 7  Pain Loc --      Pain Edu? --      Excl. in GC? --     Vital signs reviewed, nursing assessments reviewed.   Constitutional:   Alert and oriented. Non-toxic appearance. Eyes:   Conjunctivae are normal. EOMI. PERRL. ENT      Head:   Normocephalic and atraumatic.      Nose:   No congestion/rhinnorhea.       Mouth/Throat:   MMM, no pharyngeal erythema. No peritonsillar mass.       Neck:   No meningismus. Full ROM. Hematological/Lymphatic/Immunilogical:   No cervical lymphadenopathy. Cardiovascular:   RRR. Symmetric bilateral radial and DP pulses.  No murmurs. Cap refill less  than 2 seconds. Respiratory:   Normal respiratory effort without tachypnea/retractions. Breath sounds are clear and equal bilaterally. No wheezes/rales/rhonchi. Gastrointestinal:   Soft and nontender. Non distended. There is no CVA tenderness.  No rebound, rigidity, or guarding. Genitourinary:   deferred Musculoskeletal:   Normal range of motion in all extremities. No joint effusions.  No lower extremity tenderness.  No edema. Neurologic:   Normal speech and language.  Motor grossly intact. No acute focal neurologic deficits are appreciated.  Skin:    Skin is warm, dry and intact. No rash noted.  No petechiae, purpura, or bullae.  ____________________________________________    LABS (pertinent positives/negatives) (all labs ordered are listed, but only abnormal results are displayed) Labs Reviewed  BASIC METABOLIC PANEL - Abnormal; Notable for the following components:      Result Value   Glucose, Bld 103 (*)    All other components within normal limits  CBC  TROPONIN I  TROPONIN I   ____________________________________________   EKG  Interpreted by me Normal sinus rhythm rate of 78, right axis, normal intervals QRS ST segments and T waves.  ____________________________________________    RADIOLOGY  Dg Chest 1 View  Result Date: 03/03/2019 CLINICAL DATA:  Chest pain and shortness of breath for the past 2 days. EXAM: CHEST  1 VIEW COMPARISON:  Chest x-ray dated August 08, 2017. FINDINGS: The heart size and mediastinal contours are within normal limits. Normal pulmonary vascularity. No focal consolidation, pleural effusion, or pneumothorax. Unchanged linear scarring at the right lung base. No acute osseous abnormality. IMPRESSION: No active disease. Electronically Signed   By: Obie Dredge M.D.   On: 03/03/2019 12:38   Ct Angio Chest Pe W And/or Wo Contrast  Result Date: 03/03/2019 CLINICAL DATA:  58 year old female with history of chest pain for the past 2 days.  Intermittent shortness of breath. Painful knot in the leg. EXAM: CT ANGIOGRAPHY CHEST WITH CONTRAST TECHNIQUE: Multidetector CT imaging of the chest was performed using the standard protocol during bolus administration of intravenous contrast. Multiplanar CT image reconstructions and MIPs were obtained to evaluate the vascular anatomy. CONTRAST:  75mL OMNIPAQUE IOHEXOL 350 MG/ML SOLN COMPARISON:  Chest CT 03/10/2016. FINDINGS: Cardiovascular: No filling defects within the pulmonary arterial tree to suggest underlying pulmonary embolism. Heart size is mildly enlarged. There is no significant pericardial fluid, thickening or pericardial calcification. There is aortic atherosclerosis, as well as atherosclerosis of the great vessels of the mediastinum and the coronary arteries, including calcified atherosclerotic plaque in the right coronary artery. Mediastinum/Nodes: No pathologically enlarged mediastinal or hilar lymph nodes. Esophagus is unremarkable in appearance. No axillary lymphadenopathy. Lungs/Pleura: No acute consolidative airspace disease. No pleural effusions. Several tiny 2-4 mm pulmonary nodules are noted scattered throughout the lungs bilaterally, stable in size, number and distribution compared to  prior study from 03/10/2016, considered definitively benign. No larger more suspicious appearing pulmonary nodules or masses are noted. Mild linear scarring in the lung bases bilaterally. Mild diffuse bronchial wall thickening with mild centrilobular and paraseptal emphysema. Upper Abdomen: Unremarkable. Musculoskeletal: There are no aggressive appearing lytic or blastic lesions noted in the visualized portions of the skeleton. Review of the MIP images confirms the above findings. IMPRESSION: 1. No evidence of pulmonary embolism. No acute findings in the thorax to account for the patient's symptoms. 2. Mild cardiomegaly. 3. Multiple 2-4 mm pulmonary nodules in the lungs bilaterally, stable dating back to 2017,  considered definitively benign requiring no future imaging follow-up. 4. Aortic atherosclerosis, in addition to right coronary artery disease. Please note that although the presence of coronary artery calcium documents the presence of coronary artery disease, the severity of this disease and any potential stenosis cannot be assessed on this non-gated CT examination. Assessment for potential risk factor modification, dietary therapy or pharmacologic therapy may be warranted, if clinically indicated. 5. Mild diffuse bronchial wall thickening with mild centrilobular and paraseptal emphysema; imaging findings suggestive of underlying COPD. Aortic Atherosclerosis (ICD10-I70.0) and Emphysema (ICD10-J43.9). Electronically Signed   By: Trudie Reed M.D.   On: 03/03/2019 13:46    ____________________________________________   PROCEDURES Procedures  ____________________________________________  DIFFERENTIAL DIAGNOSIS   Pulmonary embolism, propagated thrombosis/DVT, pneumonia, non-STEMI, doubt dissection pericarditis or unstable angina  CLINICAL IMPRESSION / ASSESSMENT AND PLAN / ED COURSE  Medications ordered in the ED: Medications  hydrochlorothiazide (MICROZIDE) capsule 12.5 mg (12.5 mg Oral Given 03/03/19 1512)  aspirin chewable tablet 324 mg (324 mg Oral Given 03/03/19 1254)  ketorolac (TORADOL) 30 MG/ML injection 15 mg (15 mg Intravenous Given 03/03/19 1256)  famotidine (PEPCID) tablet 40 mg (40 mg Oral Given 03/03/19 1254)  iohexol (OMNIPAQUE) 350 MG/ML injection 75 mL (75 mLs Intravenous Contrast Given 03/03/19 1329)  lisinopril (ZESTRIL) tablet 10 mg (10 mg Oral Given 03/03/19 1502)    Pertinent labs & imaging results that were available during my care of the patient were reviewed by me and considered in my medical decision making (see chart for details).  ASHIYAH PAVLAK was evaluated in Emergency Department on 03/03/2019 for the symptoms described in the history of present illness. She  was evaluated in the context of the global COVID-19 pandemic, which necessitated consideration that the patient might be at risk for infection with the SARS-CoV-2 virus that causes COVID-19. Institutional protocols and algorithms that pertain to the evaluation of patients at risk for COVID-19 are in a state of rapid change based on information released by regulatory bodies including the CDC and federal and state organizations. These policies and algorithms were followed during the patient's care in the ED.   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  Labs today unremarkable, EKG unremarkable and nonischemic.  Delta troponin negative.  CT angiogram of the chest is negative for PE or other acute finding.  I will discharge home, refilled her home aspirin and antihypertensive, refer back to her primary care doctor and her [facility] specialist for further monitoring of her symptoms.      ____________________________________________   FINAL CLINICAL IMPRESSION(S) / ED DIAGNOSES    Final diagnoses:  Atypical chest pain     ED Discharge Orders         Ordered    aspirin EC 325 MG tablet  Daily     03/03/19 1516  famotidine (PEPCID) 20 MG tablet  2 times daily     03/03/19 1516    aluminum-magnesium hydroxide-simethicone (MAALOX) 200-200-20 MG/5ML SUSP  3 times daily before meals & bedtime     03/03/19 1516    lisinopril-hydrochlorothiazide (ZESTORETIC) 10-12.5 MG tablet  Daily     03/03/19 1516          Portions of this note were generated with dragon dictation software. Dictation errors may occur despite best attempts at proofreading.   Sharman CheekStafford, Marven Veley, MD 03/03/19 [phone]

## 2019-03-03 NOTE — ED Triage Notes (Signed)
Pt reports CP that started 2 days ago and is in tight in nature and moves across her chest. Pt states sometimes she is SOB. Pt also with knot in her leg.

## 2019-03-03 NOTE — ED Notes (Signed)
CT notified of IV placement. 

## 2019-03-03 NOTE — ED Notes (Signed)
MD at bedside. 

## 2019-03-03 NOTE — Discharge Instructions (Signed)
Your tests today were all okay. Please follow up with your doctors for continued monitoring of your symptoms.

## 2019-03-03 NOTE — ED Notes (Signed)
Pt ambulatory to bathroom without distress. No increased WOB noted upon ambulation.

## 2019-03-03 NOTE — ED Notes (Signed)
Pt has returned from CT and continues to be in NAD at this time.

## 2019-03-03 NOTE — ED Notes (Addendum)
Pt up to bathroom, ambulatory without assistance. NAD noted at this time. PT updated on blood draw. No questions at this time.

## 2019-03-03 NOTE — ED Notes (Signed)
X-ray at bedside

## 2019-04-28 ENCOUNTER — Other Ambulatory Visit: Payer: Self-pay

## 2019-04-28 ENCOUNTER — Emergency Department
Admission: EM | Admit: 2019-04-28 | Discharge: 2019-04-28 | Disposition: A | Discharge status: Home / Self Care | Payer: Medicaid Other | Attending: Emergency Medicine | Admitting: Emergency Medicine

## 2019-04-28 ENCOUNTER — Encounter: Payer: Self-pay | Admitting: Emergency Medicine

## 2019-04-28 DIAGNOSIS — I2510 Atherosclerotic heart disease of native coronary artery without angina pectoris: Secondary | ICD-10-CM | POA: Insufficient documentation

## 2019-04-28 DIAGNOSIS — F17200 Nicotine dependence, unspecified, uncomplicated: Secondary | ICD-10-CM | POA: Insufficient documentation

## 2019-04-28 DIAGNOSIS — J36 Peritonsillar abscess: Secondary | ICD-10-CM | POA: Diagnosis not present

## 2019-04-28 DIAGNOSIS — Z79899 Other long term (current) drug therapy: Secondary | ICD-10-CM | POA: Diagnosis not present

## 2019-04-28 DIAGNOSIS — Z7982 Long term (current) use of aspirin: Secondary | ICD-10-CM | POA: Insufficient documentation

## 2019-04-28 DIAGNOSIS — I10 Essential (primary) hypertension: Secondary | ICD-10-CM | POA: Insufficient documentation

## 2019-04-28 DIAGNOSIS — H9201 Otalgia, right ear: Secondary | ICD-10-CM | POA: Diagnosis present

## 2019-04-28 LAB — GROUP A STREP BY PCR: Group A Strep by PCR: NOT DETECTED

## 2019-04-28 MED ORDER — CLINDAMYCIN HCL 300 MG PO CAPS: 300.0000 mg | ORAL_CAPSULE | Freq: Three times a day (TID) | ORAL | 0 refills | Status: AC

## 2019-04-28 MED ORDER — DEXAMETHASONE SODIUM PHOSPHATE 10 MG/ML IJ SOLN
10.0000 mg | Freq: Once | INTRAMUSCULAR | Status: AC
  Administered 2019-04-28: 10 mg via INTRAVENOUS
  Filled 2019-04-28: qty 1

## 2019-04-28 MED ORDER — SODIUM CHLORIDE 0.9 % IV SOLN
1.0000 g | Freq: Once | INTRAVENOUS | Status: AC
  Administered 2019-04-28: 1 g via INTRAVENOUS
  Filled 2019-04-28: qty 10

## 2019-04-28 NOTE — ED Triage Notes (Signed)
Patient ambulatory to triage with steady gait, without difficulty or distress noted, mask in place; pt reports since yesterday having rt earache, sore throat & congestion

## 2019-04-28 NOTE — Discharge Instructions (Signed)
Please take the antibiotics 1 pill 3 times a day starting as soon as you can get them filled this morning.  Return this evening for recheck.  Also please return if you get increasing pain fever or any shortness of breath.  Use Tylenol or Motrin as needed for pain.  I have given you some steroids as well which should help with the pain and swelling.

## 2019-04-28 NOTE — ED Provider Notes (Signed)
[facility] Emergency Department Provider Note   ____________________________________________   First MD Initiated Contact with Patient 04/28/19 0600     (approximate)  I have reviewed the triage vital signs and the nursing notes.   HISTORY  Chief Complaint Otalgia and Sore Throat   HPI Robin Mckinney is a 58 y.o. female who comes in complaining of right earache sore throat and some congestion since yesterday.  The right side of the throat that sore.  Is hurting a lot.  Patient only has a low-grade fever.         Past Medical History:  Diagnosis Date  . Hypertension     Patient Active Problem List   Diagnosis Date Noted  . Numbness and tingling of left hand 10/18/2017  . Smoker 10/18/2017  . Coronary artery calcification 10/18/2017  . Chest pain 10/17/2017  . Benign essential HTN 10/17/2017    History reviewed. No pertinent surgical history.  Prior to Admission medications   Medication Sig Start Date End Date Taking? Authorizing Provider  aluminum-magnesium hydroxide-simethicone (MAALOX) 200-200-20 MG/5ML SUSP Take 30 mLs by mouth 4 (four) times daily -  before meals and at bedtime. 03/03/19   Sharman CheekStafford, Phillip, MD  aspirin EC 325 MG tablet Take 1 tablet (325 mg total) by mouth daily. 03/03/19   Sharman CheekStafford, Phillip, MD  clindamycin (CLEOCIN) 300 MG capsule Take 1 capsule (300 mg total) by mouth 3 (three) times daily for 10 days. 04/28/19 05/08/19  Arnaldo NatalMalinda,  F, MD  famotidine (PEPCID) 20 MG tablet Take 1 tablet (20 mg total) by mouth 2 (two) times daily. 03/03/19   Sharman CheekStafford, Phillip, MD  lisinopril-hydrochlorothiazide (PRINZIDE,ZESTORETIC) 10-12.5 MG tablet Take 1 tablet by mouth daily.    [provider]  lisinopril-hydrochlorothiazide (ZESTORETIC) 10-12.5 MG tablet Take 1 tablet by mouth daily for 30 days. 03/03/19 04/02/19  Sharman CheekStafford, Phillip, MD    Allergies Patient has no known allergies.  No family history on file.  Social  History Social History   Tobacco Use  . Smoking status: Current Every Day Smoker    Packs/day: 0.50  . Smokeless tobacco: Never Used  Substance Use Topics  . Alcohol use: No  . Drug use: No    Review of Systems  Constitutional: No fever/chills Eyes: No visual changes. ENT:  sore throat. Cardiovascular: Denies chest pain. Respiratory: Denies shortness of breath. Gastrointestinal: No abdominal pain.  No nausea, no vomiting.  No diarrhea.  No constipation. Genitourinary: Negative for dysuria. Musculoskeletal: Negative for back pain. Skin: Negative for rash. Neurological: Negative for headaches, focal weakness  ____________________________________________   PHYSICAL EXAM:  VITAL SIGNS: ED Triage Vitals  Enc Vitals Group     BP 04/28/19 0514 (!) 154/100     Pulse Rate 04/28/19 0514 72     Resp 04/28/19 0514 20     Temp 04/28/19 0514 99.5 F (37.5 C)     Temp Source 04/28/19 0514 Oral     SpO2 04/28/19 0514 97 %     Weight 04/28/19 0517 151 lb (68.5 kg)     Height 04/28/19 0517 5\' 3"  (1.6 m)     Head Circumference --      Peak Flow --      Pain Score 04/28/19 0517 8     Pain Loc --      Pain Edu? --      Excl. in GC? --     Constitutional: Alert and oriented. Well appearing and in no acute distress. Eyes: Conjunctivae are  normal. Head: Atraumatic.  TM obscured by wax on right Nose: No congestion/rhinnorhea. Mouth/Throat: Mucous membranes are moist.  Oropharynx non-erythematous. Neck: No stridor.  Right tonsil is red and inflamed with some white exudate.  Uvula is midline.   Cardiovascular: Normal rate, regular rhythm. Grossly normal heart sounds.  Good peripheral circulation. Respiratory: Normal respiratory effort.  No retractions. Lungs CTAB. Gastrointestinal: Soft and nontender. No distention. No abdominal bruits. No CVA tenderness. Musculoskeletal: No lower extremity tenderness nor edema.  Neurologic:  Normal speech and language. No gross focal neurologic  deficits are appreciated.  Skin:  Skin is warm, dry and intact. No rash noted.   ____________________________________________   LABS (all labs ordered are listed, but only abnormal results are displayed)  Labs Reviewed  GROUP A STREP BY PCR   ____________________________________________  EKG   ____________________________________________  RADIOLOGY  ED MD interpretation:    Official radiology report(s): No results found.  ____________________________________________   PROCEDURES  Procedure(s) performed (including Critical Care):  Procedures   ____________________________________________   INITIAL IMPRESSION / ASSESSMENT AND PLAN / ED COURSE  [name] was evaluated in Emergency Department on 04/28/2019 for the symptoms described in the history of present illness. She was evaluated in the context of the global COVID-19 pandemic, which necessitated consideration that the patient might be at risk for infection with the SARS-CoV-2 virus that causes COVID-19. Institutional protocols and algorithms that pertain to the evaluation of patients at risk for COVID-19 are in a state of rapid change based on information released by regulatory bodies including the CDC and federal and state organizations. These policies and algorithms were followed during the patient's care in the ED.    Clinically patient has peritonsillar cellulitis.  We will treat her with dexamethasone 1 dose of Rocephin and p.o. clindamycin.  We will have her come back in the evening for recheck.  I do not want to wait until tomorrow morning.  Patient will return sooner if she is worse.            ____________________________________________   FINAL CLINICAL IMPRESSION(S) / ED DIAGNOSES  Final diagnoses:  Peritonsillar cellulitis     ED Discharge Orders         Ordered    clindamycin (CLEOCIN) 300 MG capsule  3 times daily     04/28/19 0610           Note:  This document was prepared  using Dragon voice recognition software and may include unintentional dictation errors.    Nena Polio, MD 04/28/19 [phone]

## 2019-06-26 ENCOUNTER — Ambulatory Visit (LOCAL_COMMUNITY_HEALTH_CENTER): Payer: Self-pay

## 2019-06-26 ENCOUNTER — Other Ambulatory Visit: Payer: Self-pay

## 2019-06-26 DIAGNOSIS — Z111 Encounter for screening for respiratory tuberculosis: Secondary | ICD-10-CM

## 2019-06-29 ENCOUNTER — Ambulatory Visit (LOCAL_COMMUNITY_HEALTH_CENTER): Payer: Medicaid Other

## 2019-06-29 ENCOUNTER — Other Ambulatory Visit: Payer: Self-pay

## 2019-06-29 DIAGNOSIS — Z111 Encounter for screening for respiratory tuberculosis: Secondary | ICD-10-CM

## 2019-06-29 LAB — TB SKIN TEST
Induration: 0 mm
TB Skin Test: NEGATIVE

## 2020-04-28 ENCOUNTER — Encounter: Payer: Self-pay | Admitting: Emergency Medicine

## 2020-04-28 ENCOUNTER — Emergency Department: Payer: Medicaid Other

## 2020-04-28 ENCOUNTER — Other Ambulatory Visit: Payer: Self-pay

## 2020-04-28 ENCOUNTER — Emergency Department
Admission: EM | Admit: 2020-04-28 | Discharge: 2020-04-28 | Disposition: A | Discharge status: Home / Self Care | Payer: Medicaid Other | Attending: Emergency Medicine | Admitting: Emergency Medicine

## 2020-04-28 DIAGNOSIS — Z79899 Other long term (current) drug therapy: Secondary | ICD-10-CM | POA: Insufficient documentation

## 2020-04-28 DIAGNOSIS — F17200 Nicotine dependence, unspecified, uncomplicated: Secondary | ICD-10-CM | POA: Insufficient documentation

## 2020-04-28 DIAGNOSIS — M25462 Effusion, left knee: Secondary | ICD-10-CM | POA: Diagnosis not present

## 2020-04-28 DIAGNOSIS — Z7982 Long term (current) use of aspirin: Secondary | ICD-10-CM | POA: Diagnosis not present

## 2020-04-28 DIAGNOSIS — I10 Essential (primary) hypertension: Secondary | ICD-10-CM | POA: Diagnosis not present

## 2020-04-28 DIAGNOSIS — M25562 Pain in left knee: Secondary | ICD-10-CM | POA: Diagnosis present

## 2020-04-28 LAB — CBC WITH DIFFERENTIAL/PLATELET
Abs Immature Granulocytes: 0.02 10*3/uL (ref 0.00–0.07)
Basophils Absolute: 0.1 10*3/uL (ref 0.0–0.1)
Basophils Relative: 1 %
Eosinophils Absolute: 0 10*3/uL (ref 0.0–0.5)
Eosinophils Relative: 0 %
HCT: 40.9 % (ref 36.0–46.0)
Hemoglobin: 13.4 g/dL (ref 12.0–15.0)
Immature Granulocytes: 0 %
Lymphocytes Relative: 17 %
Lymphs Abs: 1.2 10*3/uL (ref 0.7–4.0)
MCH: 30.6 pg (ref 26.0–34.0)
MCHC: 32.8 g/dL (ref 30.0–36.0)
MCV: 93.4 fL (ref 80.0–100.0)
Monocytes Absolute: 0.7 10*3/uL (ref 0.1–1.0)
Monocytes Relative: 10 %
Neutro Abs: 5.4 10*3/uL (ref 1.7–7.7)
Neutrophils Relative %: 72 %
Platelets: 266 10*3/uL (ref 150–400)
RBC: 4.38 MIL/uL (ref 3.87–5.11)
RDW: 13.3 % (ref 11.5–15.5)
WBC: 7.4 10*3/uL (ref 4.0–10.5)
nRBC: 0 % (ref 0.0–0.2)

## 2020-04-28 LAB — SEDIMENTATION RATE: Sed Rate: 18 mm/hr (ref 0–30)

## 2020-04-28 LAB — URIC ACID: Uric Acid, Serum: 4.6 mg/dL (ref 2.5–7.1)

## 2020-04-28 LAB — BASIC METABOLIC PANEL
Anion gap: 11 (ref 5–15)
BUN: 9 mg/dL (ref 6–20)
CO2: 26 mmol/L (ref 22–32)
Calcium: 8.9 mg/dL (ref 8.9–10.3)
Chloride: 100 mmol/L (ref 98–111)
Creatinine, Ser: 0.81 mg/dL (ref 0.44–1.00)
GFR calc Af Amer: >60 mL/min (ref >60)
GFR calc non Af Amer: >60 mL/min (ref >60)
Glucose, Bld: 98 mg/dL (ref 70–99)
Potassium: 3.1 mmol/L — ABNORMAL LOW (ref 3.5–5.1)
Sodium: 137 mmol/L (ref 135–145)

## 2020-04-28 MED ORDER — TRAMADOL HCL 50 MG PO TABS
50.0000 mg | ORAL_TABLET | Freq: Once | ORAL | Status: AC
  Administered 2020-04-28: 50 mg via ORAL
  Filled 2020-04-28: qty 1

## 2020-04-28 MED ORDER — NAPROXEN 375 MG PO TABS: 375.0000 mg | ORAL_TABLET | Freq: Two times a day (BID) | ORAL | 0 refills | Status: DC

## 2020-04-28 MED ORDER — TRAMADOL HCL 50 MG PO TABS: 50.0000 mg | ORAL_TABLET | Freq: Four times a day (QID) | ORAL | 0 refills | Status: AC | PRN

## 2020-04-28 MED ORDER — NAPROXEN 500 MG PO TABS
500.0000 mg | ORAL_TABLET | Freq: Once | ORAL | Status: AC
  Administered 2020-04-28: 500 mg via ORAL
  Filled 2020-04-28: qty 1

## 2020-04-28 NOTE — ED Notes (Signed)
Crutch teaching done by tech.  Pt verbalized understanding

## 2020-04-28 NOTE — ED Triage Notes (Signed)
Left knee pain and swelling x 3 days.  Denies injury.  States symptoms have occurred in the past and have resolved, but swelling worse with this episode.

## 2020-04-28 NOTE — ED Provider Notes (Signed)
[facility] Emergency Department Provider Note   ____________________________________________   First MD Initiated Contact with Patient 04/28/20 0830     (approximate)  I have reviewed the triage vital signs and the nursing notes.   HISTORY  Chief Complaint Joint Swelling    HPI Robin Mckinney is a 59 y.o. female patient presents with left knee pain and swelling for 3 days.  Patient denies provocative for complaint.  Patient states similar episode to right knee 3 weeks ago that resolved without intervention.  Patient rates pain as a 10/10.  Patient described pain as "achy".  No palliative measures for complaint.         Past Medical History:  Diagnosis Date  . Hypertension     Patient Active Problem List   Diagnosis Date Noted  . Numbness and tingling of left hand 10/18/2017  . Smoker 10/18/2017  . Coronary artery calcification 10/18/2017  . Chest pain 10/17/2017  . Benign essential HTN 10/17/2017    History reviewed. No pertinent surgical history.  Prior to Admission medications   Medication Sig Start Date End Date Taking? Authorizing Provider  aluminum-magnesium hydroxide-simethicone (MAALOX) 200-200-20 MG/5ML SUSP Take 30 mLs by mouth 4 (four) times daily -  before meals and at bedtime. 03/03/19   Sharman Cheek, MD  aspirin EC 325 MG tablet Take 1 tablet (325 mg total) by mouth daily. 03/03/19   Sharman Cheek, MD  famotidine (PEPCID) 20 MG tablet Take 1 tablet (20 mg total) by mouth 2 (two) times daily. 03/03/19   Sharman Cheek, MD  lisinopril-hydrochlorothiazide (PRINZIDE,ZESTORETIC) 10-12.5 MG tablet Take 1 tablet by mouth daily.    [provider]  lisinopril-hydrochlorothiazide (ZESTORETIC) 10-12.5 MG tablet Take 1 tablet by mouth daily for 30 days. 03/03/19 04/02/19  Sharman Cheek, MD  naproxen (NAPROSYN) 375 MG tablet Take 1 tablet (375 mg total) by mouth 2 (two) times daily with a meal. 04/28/20   Joni Reining, PA-C  traMADol (ULTRAM) 50 MG tablet Take 1 tablet (50 mg total) by mouth every 6 (six) hours as needed. 04/28/20 04/28/21  Joni Reining, PA-C    Allergies Patient has no known allergies.  No family history on file.  Social History Social History   Tobacco Use  . Smoking status: Current Every Day Smoker    Packs/day: 0.50  . Smokeless tobacco: Never Used  Substance Use Topics  . Alcohol use: No  . Drug use: No    Review of Systems Constitutional: No fever/chills Eyes: No visual changes. ENT: No sore throat. Cardiovascular: Denies chest pain. Respiratory: Denies shortness of breath. Gastrointestinal: No abdominal pain.  No nausea, no vomiting.  No diarrhea.  No constipation. Genitourinary: Negative for dysuria. Musculoskeletal: Left knee pain. Skin: Negative for rash. Neurological: Negative for headaches, focal weakness or numbness. Endocrine:  Hypertension ____________________________________________   PHYSICAL EXAM:  VITAL SIGNS: ED Triage Vitals  Enc Vitals Group     BP 04/28/20 0818 (!) 157/112     Pulse Rate 04/28/20 0818 76     Resp 04/28/20 0818 16     Temp 04/28/20 0818 98.9 F (37.2 C)     Temp Source 04/28/20 0818 Oral     SpO2 04/28/20 0818 99 %     Weight 04/28/20 0817 151 lb 0.2 oz (68.5 kg)     Height 04/28/20 0817 5\' 3"  (1.6 m)     Head Circumference --      Peak Flow --      Pain  Score 04/28/20 0816 10     Pain Loc --      Pain Edu? --      Excl. in GC? --    Constitutional: Alert and oriented. Well appearing and in no acute distress. Cardiovascular: Normal rate, regular rhythm. Grossly normal heart sounds.  Good peripheral circulation.  Elevated blood pressure Respiratory: Normal respiratory effort.  No retractions. Lungs CTAB. Musculoskeletal: No obvious deformity.  Moderate edema to left knee.  No laxity with stress testing.  Patient has decreased extension secondary to complaint of pain.   Neurologic:  Normal speech and language. No  gross focal neurologic deficits are appreciated. No gait instability. Skin:  Skin is warm, dry and intact. No rash noted.  Moderate edema.  No erythema.  No abrasions or ecchymosis.   ____________________________________________   LABS (all labs ordered are listed, but only abnormal results are displayed)  Labs Reviewed  BASIC METABOLIC PANEL - Abnormal; Notable for the following components:      Result Value   Potassium 3.1 (*)    All other components within normal limits  CBC WITH DIFFERENTIAL/PLATELET  URIC ACID  SEDIMENTATION RATE   ____________________________________________  EKG   ____________________________________________  RADIOLOGY  ED MD interpretation:    Official radiology report(s): DG Knee Complete 4 Views Left  Result Date: 04/28/2020 CLINICAL DATA:  Left knee pain and swelling for 3 days. No reported injury. Similar symptoms in the past. EXAM: LEFT KNEE - COMPLETE 4+ VIEW COMPARISON:  None. FINDINGS: No acute fracture or dislocation is identified. There is a large knee joint effusion. There is chondrocalcinosis involving the lateral greater than medial compartments with at most mild joint space narrowing. There is mild marginal osteophytosis involving the lateral compartment. Mild soft tissue swelling is noted about the knee. IMPRESSION: 1. Large knee joint effusion. 2. Chondrocalcinosis which can be seen with CPPD. Electronically Signed   By: Sebastian Ache M.D.   On: 04/28/2020 09:06    ____________________________________________   PROCEDURES  Procedure(s) performed (including Critical Care):  Procedures   ____________________________________________   INITIAL IMPRESSION / ASSESSMENT AND PLAN / ED COURSE  As part of my medical decision making, I reviewed the following data within the electronic MEDICAL RECORD NUMBER     Patient presents with 3 days of anterior superior patella edema.  No provocative incident for complaint.  X-rays remarkable for mild  degenerative changes and effusion.  Patient given discharge care instructions.  Patient knee was Ace wrapped she was given crutches assist with ambulation.  Patient consult orthopedics.  Patient advised to contact them today to schedule appointment.  Take medication as directed.    DONNALYNN WHEELESS was evaluated in Emergency Department on 04/28/2020 for the symptoms described in the history of present illness. She was evaluated in the context of the global COVID-19 pandemic, which necessitated consideration that the patient might be at risk for infection with the SARS-CoV-2 virus that causes COVID-19. Institutional protocols and algorithms that pertain to the evaluation of patients at risk for COVID-19 are in a state of rapid change based on information released by regulatory bodies including the CDC and federal and state organizations. These policies and algorithms were followed during the patient's care in the ED.       ____________________________________________   FINAL CLINICAL IMPRESSION(S) / ED DIAGNOSES  Final diagnoses:  Prepatellar effusion of left knee     ED Discharge Orders         Ordered    naproxen (NAPROSYN) 375 MG  tablet  2 times daily with meals     Discontinue  Reprint     04/28/20 0943    traMADol (ULTRAM) 50 MG tablet  Every 6 hours PRN     Discontinue  Reprint     04/28/20 0943           Note:  This document was prepared using Dragon voice recognition software and may include unintentional dictation errors.    Joni Reining, PA-C 04/28/20 3329    Sharman Cheek, MD 04/28/20 1459

## 2020-04-28 NOTE — Discharge Instructions (Signed)
Follow discharge care instruction.  Ambulate with crutches.  Follow-up with orthopedic but call today to schedule an appointment.  Tell them you are a follow-up from the emergency room.  Be advised medication may cause drowsiness.

## 2020-11-08 ENCOUNTER — Ambulatory Visit: Payer: Self-pay | Admitting: *Deleted

## 2020-11-08 NOTE — Telephone Encounter (Signed)
Pt  Evasive historian and difficult to understand. Reports dizziness, "Off balance" seating/chils fever but can not verbalize what temp is. Also states "My heart veins are blocked and this is what I feel like when they block." Advised ED. States will follow disposition.  Answer Assessment - Initial Assessment Questions 1. DESCRIPTION: "Describe your dizziness."     *No Answer* 2. VERTIGO: "Do you feel like either you or the room is spinning or tilting?"      *No Answer* 3. LIGHTHEADED: "Do you feel lightheaded?" (e.g., somewhat faint, woozy, weak upon standing)     *No Answer* 4. SEVERITY: "How bad is it?"  "Can you walk?"   - MILD: Feels unsteady but walking normally.   - MODERATE: Feels very unsteady when walking, but not falling; interferes with normal activities (e.g., school, work) .   - SEVERE: Unable to walk without falling, or requires assistance to walk without falling.     *No Answer* 5. ONSET:  "When did the dizziness begin?"     *No Answer* 6. AGGRAVATING FACTORS: "Does anything make it worse?" (e.g., standing, change in head position)     *No Answer* 7. CAUSE: "What do you think is causing the dizziness?"     *No Answer* 8. RECURRENT SYMPTOM: "Have you had dizziness before?" If Yes, ask: "When was the last time?" "What happened that time?"     *No Answer* 9. OTHER SYMPTOMS: "Do you have any other symptoms?" (e.g., headache, weakness, numbness, vomiting, earache)     *No Answer* 10. PREGNANCY: "Is there any chance you are pregnant?" "When was your last menstrual period?"       *No Answer*  Protocols used: DIZZINESS - VERTIGO-A-AH

## 2020-12-23 ENCOUNTER — Ambulatory Visit (LOCAL_COMMUNITY_HEALTH_CENTER): Payer: Medicaid Other

## 2020-12-23 ENCOUNTER — Other Ambulatory Visit: Payer: Self-pay

## 2020-12-23 DIAGNOSIS — Z111 Encounter for screening for respiratory tuberculosis: Secondary | ICD-10-CM

## 2020-12-26 ENCOUNTER — Other Ambulatory Visit: Payer: Self-pay

## 2020-12-26 ENCOUNTER — Ambulatory Visit (LOCAL_COMMUNITY_HEALTH_CENTER): Payer: Medicaid Other

## 2020-12-26 DIAGNOSIS — Z111 Encounter for screening for respiratory tuberculosis: Secondary | ICD-10-CM

## 2020-12-26 LAB — TB SKIN TEST
Induration: 0 mm
TB Skin Test: NEGATIVE

## 2021-04-14 ENCOUNTER — Ambulatory Visit (LOCAL_COMMUNITY_HEALTH_CENTER): Payer: Medicaid Other

## 2021-04-14 ENCOUNTER — Other Ambulatory Visit: Payer: Self-pay

## 2021-04-14 DIAGNOSIS — Z111 Encounter for screening for respiratory tuberculosis: Secondary | ICD-10-CM

## 2021-04-17 ENCOUNTER — Other Ambulatory Visit: Payer: Self-pay

## 2021-04-17 ENCOUNTER — Ambulatory Visit (LOCAL_COMMUNITY_HEALTH_CENTER): Payer: Medicaid Other

## 2021-04-17 DIAGNOSIS — Z111 Encounter for screening for respiratory tuberculosis: Secondary | ICD-10-CM

## 2021-04-17 LAB — TB SKIN TEST
Induration: 0 mm
TB Skin Test: NEGATIVE

## 2021-04-27 ENCOUNTER — Other Ambulatory Visit: Payer: Self-pay | Admitting: Primary Care

## 2021-04-27 DIAGNOSIS — Z1231 Encounter for screening mammogram for malignant neoplasm of breast: Secondary | ICD-10-CM

## 2021-05-19 ENCOUNTER — Ambulatory Visit
Admission: RE | Admit: 2021-05-19 | Discharge: 2021-05-19 | Disposition: A | Discharge status: Home / Self Care | Payer: Medicaid Other | Source: Ambulatory Visit | Attending: Primary Care | Admitting: Primary Care

## 2021-05-19 ENCOUNTER — Other Ambulatory Visit: Payer: Self-pay

## 2021-05-19 DIAGNOSIS — Z1231 Encounter for screening mammogram for malignant neoplasm of breast: Secondary | ICD-10-CM | POA: Insufficient documentation

## 2021-12-04 ENCOUNTER — Other Ambulatory Visit: Payer: Self-pay

## 2021-12-04 ENCOUNTER — Emergency Department: Payer: Medicaid Other

## 2021-12-04 ENCOUNTER — Emergency Department
Admission: EM | Admit: 2021-12-04 | Discharge: 2021-12-04 | Disposition: A | Discharge status: Home / Self Care | Payer: Medicaid Other | Attending: Emergency Medicine | Admitting: Emergency Medicine

## 2021-12-04 ENCOUNTER — Encounter: Payer: Self-pay | Admitting: Intensive Care

## 2021-12-04 DIAGNOSIS — M1120 Other chondrocalcinosis, unspecified site: Secondary | ICD-10-CM

## 2021-12-04 DIAGNOSIS — M11262 Other chondrocalcinosis, left knee: Secondary | ICD-10-CM | POA: Diagnosis not present

## 2021-12-04 DIAGNOSIS — M7122 Synovial cyst of popliteal space [Baker], left knee: Secondary | ICD-10-CM | POA: Diagnosis not present

## 2021-12-04 DIAGNOSIS — M25562 Pain in left knee: Secondary | ICD-10-CM | POA: Diagnosis present

## 2021-12-04 MED ORDER — NAPROXEN 375 MG PO TABS: 375.0000 mg | ORAL_TABLET | Freq: Two times a day (BID) | ORAL | 0 refills | Status: DC

## 2021-12-04 NOTE — ED Provider Notes (Signed)
[facility] Provider Note    Event Date/Time   First MD Initiated Contact with Patient 12/04/21 1321     (approximate)   History   Knee Pain   HPI  Robin Mckinney is a 61 y.o. female   presents to the ED with complaint of left painful knee and fluid buildup.  Patient states that she had something similar to this a year or so ago and was placed on some medication and by the time she was supposed to see the orthopedist the swelling of her knee had completely disappeared and she did not keep the appointment.  Patient denies any recent injury to her knee.  Currently she rates her pain as an 8 out of 10.      Physical Exam   Triage Vital Signs: ED Triage Vitals  Enc Vitals Group     BP 12/04/21 1159 (!) 155/95     Pulse Rate 12/04/21 1159 71     Resp 12/04/21 1159 16     Temp 12/04/21 1159 98.5 F (36.9 C)     Temp Source 12/04/21 1159 Oral     SpO2 12/04/21 1159 99 %     Weight 12/04/21 1200 140 lb (63.5 kg)     Height 12/04/21 1200 5\' 6"  (1.676 m)     Head Circumference --      Peak Flow --      Pain Score 12/04/21 1200 8     Pain Loc --      Pain Edu? --      Excl. in Media? --     Most recent vital signs: Vitals:   12/04/21 1159  BP: (!) 155/95  Pulse: 71  Resp: 16  Temp: 98.5 F (36.9 C)  SpO2: 99%     General: Awake, no distress.  CV:  Good peripheral perfusion.  Heart regular rate and rhythm. Resp:  Normal effort.  Lungs are clear bilaterally. Abd:  No distention.  Other:  Examination of the left lower extremity there is no evidence of injury.  Degenerative changes are noted to the knee but skin is intact without erythema.  Bevelyn Buckles' sign is negative.  There is moderate tenderness palpation posterior patella area.  There also appears to be a cystic area posteriorly that reproduces some of the patient's pain.  No evidence of injury in this area.  Range of motion is slow and guarded secondary to pain.  Pulses are present distally.  No  warmth or redness is noted.   ED Results / Procedures / Treatments   Labs (all labs ordered are listed, but only abnormal results are displayed) Labs Reviewed - No data to display    RADIOLOGY Venous ultrasound of the left leg shows a 4.2 cm Baker's cyst and no evidence of DVT.  X-ray of the left knee images were reviewed by myself and no acute bony injury is noted.  There is degenerative changes noted.  Radiology report shows suprapatella joint effusion and chondralcalcinosis  PROCEDURES:  Critical Care performed:   Procedures   MEDICATIONS ORDERED IN ED: Medications - No data to display   IMPRESSION / MDM / Merlin / ED COURSE  I reviewed the triage vital signs and the nursing notes.   Differential diagnosis includes, but is not limited to, DVT, left lower leg strain, degenerative joint disease, fracture of the left knee.  61 year old female presents to the ED with complaint of left lower extremity pain especially around her knee.  She has had this problem before and states that prior to being seen by orthopedist the swelling disappeared and she did not follow-up with orthopedics.  Patient is tender posterior left knee area with a soft moderately tender cystic lesion noted.  On ultrasound no DVT was noted however patient does have a 4.2 cm Baker's cyst present.  Patient was made aware and encouraged to follow-up with orthopedics.  She states that the medication she was given last time she was here helped a great deal.  This medication and review of her chart was naproxen and this was sent to her pharmacy.  She is encouraged to eat before taking this medication as it could cause GI upset.  Dr. Roland Rack is on-call and contact information was given to her.     FINAL CLINICAL IMPRESSION(S) / ED DIAGNOSES   Final diagnoses:  Baker's cyst of knee, left  Chondrocalcinosis     Rx / DC Orders   ED Discharge Orders          Ordered    naproxen (NAPROSYN) 375 MG  tablet  2 times daily with meals        12/04/21 1458             Note:  This document was prepared using Dragon voice recognition software and may include unintentional dictation errors.   Johnn Hai, PA-C 12/04/21 1641    Lavonia Drafts, MD 12/04/21 [phone]

## 2021-12-04 NOTE — ED Notes (Signed)
Pt via POV from home. See triage note. Pt c/o L knee. Pt states she noticed a hard knot to the posterior aspect of the L knee. States that it makes her leg feel heavy but not painful. Pt is ambulatory to room.

## 2021-12-04 NOTE — ED Triage Notes (Signed)
Patient c/o left knee pain/fluid build up. HX same

## 2021-12-04 NOTE — ED Provider Triage Note (Signed)
Emergency Medicine Provider Triage Evaluation Note  Robin Mckinney , a 61 y.o. female  was evaluated in triage.  Pt complains of left knee pain and swelling.  Swelling behind the knee..  Review of Systems  Positive: Left knee pain Negative: Fever chills injury  Physical Exam  There were no vitals taken for this visit. Gen:   Awake, no distress   Resp:  Normal effort  MSK:   Moves extremities without difficulty, swelling noted at the left knee, tender posteriorly Other:    Medical Decision Making  Medically screening exam initiated at 11:59 AM.  Appropriate orders placed.  Robin Mckinney was informed that the remainder of the evaluation will be completed by another provider, this initial triage assessment does not replace that evaluation, and the importance of remaining in the ED until their evaluation is complete.  We will do x-ray and ultrasound for DVT   Versie Starks, PA-C 12/04/21 1200

## 2021-12-04 NOTE — Discharge Instructions (Addendum)
Call make an appointment with Dr. Joice Lofts who is the orthopedist on-call.  A prescription for naproxen was sent to the pharmacy.  This is the same medication that you took the last time you had this problem and usually said that this helped a great deal.  Also you may use ice or heat to your knee as needed for discomfort.  There is no signs of a blood clot in your leg.

## 2021-12-28 ENCOUNTER — Ambulatory Visit: Payer: Medicaid Other

## 2022-02-14 ENCOUNTER — Ambulatory Visit: Payer: Medicaid Other | Attending: Neurology

## 2022-02-14 DIAGNOSIS — G4733 Obstructive sleep apnea (adult) (pediatric): Secondary | ICD-10-CM | POA: Insufficient documentation

## 2022-02-14 DIAGNOSIS — R5382 Chronic fatigue, unspecified: Secondary | ICD-10-CM | POA: Insufficient documentation

## 2022-02-14 DIAGNOSIS — I10 Essential (primary) hypertension: Secondary | ICD-10-CM | POA: Insufficient documentation

## 2022-02-14 DIAGNOSIS — R0683 Snoring: Secondary | ICD-10-CM | POA: Diagnosis not present

## 2022-02-14 DIAGNOSIS — Z8679 Personal history of other diseases of the circulatory system: Secondary | ICD-10-CM | POA: Insufficient documentation

## 2022-02-26 ENCOUNTER — Ambulatory Visit (LOCAL_COMMUNITY_HEALTH_CENTER): Payer: Self-pay

## 2022-02-26 DIAGNOSIS — Z111 Encounter for screening for respiratory tuberculosis: Secondary | ICD-10-CM

## 2022-03-01 ENCOUNTER — Ambulatory Visit (LOCAL_COMMUNITY_HEALTH_CENTER): Payer: Self-pay

## 2022-03-01 DIAGNOSIS — Z111 Encounter for screening for respiratory tuberculosis: Secondary | ICD-10-CM

## 2022-03-01 LAB — TB SKIN TEST
Induration: 0 mm
TB Skin Test: NEGATIVE

## 2022-05-07 ENCOUNTER — Encounter: Payer: Self-pay | Admitting: Emergency Medicine

## 2022-05-07 ENCOUNTER — Emergency Department
Admission: EM | Admit: 2022-05-07 | Discharge: 2022-05-07 | Disposition: A | Discharge status: Home / Self Care | Payer: Medicaid Other | Attending: Emergency Medicine | Admitting: Emergency Medicine

## 2022-05-07 ENCOUNTER — Emergency Department: Payer: Medicaid Other

## 2022-05-07 ENCOUNTER — Other Ambulatory Visit: Payer: Self-pay

## 2022-05-07 DIAGNOSIS — I2510 Atherosclerotic heart disease of native coronary artery without angina pectoris: Secondary | ICD-10-CM | POA: Insufficient documentation

## 2022-05-07 DIAGNOSIS — R0789 Other chest pain: Secondary | ICD-10-CM | POA: Insufficient documentation

## 2022-05-07 DIAGNOSIS — I10 Essential (primary) hypertension: Secondary | ICD-10-CM | POA: Insufficient documentation

## 2022-05-07 LAB — BASIC METABOLIC PANEL
Anion gap: 9 (ref 5–15)
BUN: 19 mg/dL (ref 8–23)
CO2: 28 mmol/L (ref 22–32)
Calcium: 9.4 mg/dL (ref 8.9–10.3)
Chloride: 104 mmol/L (ref 98–111)
Creatinine, Ser: 1.02 mg/dL — ABNORMAL HIGH (ref 0.44–1.00)
GFR, Estimated: >60 mL/min (ref >60)
Glucose, Bld: 108 mg/dL — ABNORMAL HIGH (ref 70–99)
Potassium: 3.8 mmol/L (ref 3.5–5.1)
Sodium: 141 mmol/L (ref 135–145)

## 2022-05-07 LAB — TROPONIN I (HIGH SENSITIVITY)
Troponin I (High Sensitivity): 4 ng/L (ref <18)
Troponin I (High Sensitivity): 4 ng/L (ref <18)

## 2022-05-07 LAB — CBC
HCT: 39.2 % (ref 36.0–46.0)
Hemoglobin: 12.7 g/dL (ref 12.0–15.0)
MCH: 30.3 pg (ref 26.0–34.0)
MCHC: 32.4 g/dL (ref 30.0–36.0)
MCV: 93.6 fL (ref 80.0–100.0)
Platelets: 321 10*3/uL (ref 150–400)
RBC: 4.19 MIL/uL (ref 3.87–5.11)
RDW: 13.2 % (ref 11.5–15.5)
WBC: 6.2 10*3/uL (ref 4.0–10.5)
nRBC: 0 % (ref 0.0–0.2)

## 2022-05-07 NOTE — ED Provider Notes (Signed)
[facility] Provider Note    Event Date/Time   First MD Initiated Contact with Patient 05/07/22 1736     (approximate)   History   Chest Pain   HPI  Robin Mckinney is a 61 y.o. female with hx of htn, cad, sleep apnea, presents to the ER with complaints of chest squeezing, states she got really upset and her chest felt like it was squeezing.  Has a cardiologist.  Recently had sleep study and has has sleep apnea per the patient.  No Sob, no pain in legs, no hx blood clots      Physical Exam   Triage Vital Signs: ED Triage Vitals  Enc Vitals Group     BP 05/07/22 1454 114/75     Pulse Rate 05/07/22 1454 70     Resp 05/07/22 1454 18     Temp --      Temp Source 05/07/22 1454 Oral     SpO2 05/07/22 1454 100 %     Weight 05/07/22 1457 140 lb (63.5 kg)     Height 05/07/22 1457 5\' 4"  (1.626 m)     Head Circumference --      Peak Flow --      Pain Score 05/07/22 1451 8     Pain Loc --      Pain Edu? --      Excl. in GC? --     Most recent vital signs: Vitals:   05/07/22 1850 05/07/22 1850  BP: (!) 138/100   Pulse: 68   Resp: 16   Temp:  98 F (36.7 C)  SpO2: 98%      General: Awake, no distress.   CV:  Good peripheral perfusion. regular rate and  rhythm Resp:  Normal effort. Lungs c ta Abd:  No distention.   Other:      ED Results / Procedures / Treatments   Labs (all labs ordered are listed, but only abnormal results are displayed) Labs Reviewed  BASIC METABOLIC PANEL - Abnormal; Notable for the following components:      Result Value   Glucose, Bld 108 (*)    Creatinine, Ser 1.02 (*)    All other components within normal limits  CBC  TROPONIN I (HIGH SENSITIVITY)  TROPONIN I (HIGH SENSITIVITY)     EKG  Ekg shows nsr, no change since may 2020   RADIOLOGY cxr    PROCEDURES:   Procedures   MEDICATIONS ORDERED IN ED: Medications - No data to display   IMPRESSION / MDM / ASSESSMENT AND PLAN / ED COURSE   I reviewed the triage vital signs and the nursing notes.                              Differential diagnosis includes, but is not limited to, mi, anxiety, nonspecific chest pain, PE  Patient's presentation is most consistent with acute presentation with potential threat to life or bodily function.   Ekg shows nsr, no change since may 2020, see physician read  Cxr dependently reviewed and interpreted by me as benign  Labs are reassuring, awaiting second troponin  Care transferred to 06-04-1983, PA-C   FINAL CLINICAL IMPRESSION(S) / ED DIAGNOSES   Final diagnoses:  Other chest pain     Rx / DC Orders   ED Discharge Orders     None        Note:  This document was prepared  using Conservation officer, historic buildings and may include unintentional dictation errors.    Faythe Ghee, PA-C 05/07/22 2013    Chesley Noon, MD 05/07/22 2239

## 2022-05-07 NOTE — ED Triage Notes (Signed)
Chest pain for 2 days °

## 2022-05-07 NOTE — ED Provider Triage Note (Signed)
Emergency Medicine Provider Triage Evaluation Note  Robin Mckinney , a 61 y.o. female  was evaluated in triage.  Pt complains of left sided chest pain x 3 days. + SOB.  Taking tylenol without any improvement.   Review of Systems  Positive: CP, SOB Negative: - indigestion  Physical Exam  There were no vitals taken for this visit. Gen:   Awake, no distress   Resp:  Normal effort  MSK:   Moves extremities without difficulty  Other:    Medical Decision Making  Medically screening exam initiated at 2:30 PM.  Appropriate orders placed.  Robin Mckinney was informed that the remainder of the evaluation will be completed by another provider, this initial triage assessment does not replace that evaluation, and the importance of remaining in the ED until their evaluation is complete.     Tommi Rumps, PA-C 05/07/22 1431

## 2022-05-07 NOTE — Discharge Instructions (Addendum)
-  Please follow-up with your cardiologist and primary care provider as discussed  -Return to the emergency department anytime if you begin to experience any new or worsening symptoms.

## 2022-05-07 NOTE — ED Provider Notes (Signed)
  Physical Exam  BP (!) 138/91   Pulse 65   Temp 98 F (36.7 C) (Oral)   Resp 18   Ht 5\' 4"  (1.626 m)   Wt 63.5 kg   SpO2 99%   BMI 24.03 kg/m   Physical Exam  Procedures  Procedures  ED Course / MDM    Medical Decision Making Amount and/or Complexity of Data Reviewed Labs: ordered. Radiology: ordered.   This patient was transferred over to me from , PA-C at approximately 1900.  Plan is to await lab results and follow-up as needed.  Patient appears well on reexamination.  She is not currently having any chest pain or shortness of breath at this time.  Lab work-up is unremarkable.  Both troponins were negative.  EKG is unremarkable.  Very low suspicion for a serious or life-threatening pathology.  Highly unlikely to be aortic dissection, pulmonary embolism, ACS, myocarditis/pericarditis, or pneumonia.  Upon further discussion, patient states that she has been feeling very stressed lately.  I suspect that her pain is likely being caused or exacerbated by anxiety.  She does have a cardiologist that she sees regularly.  Encouraged her to follow-up with them, as well as her regular provider.  We will plan to discharge.       Greig Right, Varney Daily 05/08/22 07/08/22, MD 05/08/22 [phone]

## 2022-05-21 ENCOUNTER — Ambulatory Visit: Payer: Self-pay | Admitting: Internal Medicine

## 2022-05-21 NOTE — Progress Notes (Signed)
Pt did not show for scheduled appointment.  

## 2022-10-04 ENCOUNTER — Other Ambulatory Visit: Payer: Self-pay

## 2022-10-04 ENCOUNTER — Emergency Department
Admission: EM | Admit: 2022-10-04 | Discharge: 2022-10-04 | Disposition: A | Discharge status: Home / Self Care | Payer: Medicaid Other | Attending: Emergency Medicine | Admitting: Emergency Medicine

## 2022-10-04 DIAGNOSIS — M25562 Pain in left knee: Secondary | ICD-10-CM | POA: Insufficient documentation

## 2022-10-04 MED ORDER — NAPROXEN 500 MG PO TABS: 500.0000 mg | ORAL_TABLET | Freq: Two times a day (BID) | ORAL | 2 refills | Status: DC

## 2022-10-04 NOTE — ED Triage Notes (Signed)
Pt reports has issues with her left knee. Pt reports has intermittent episodes of swelling and also had a bakers cyst in the past in the left knee and was told if it came back she may need surgery. Pt reports for the last few days has had increased swelling in her left knee and now has a knot on the back and it feels like it is her bakers cyst coming back

## 2022-10-04 NOTE — ED Provider Notes (Signed)
   [facility] Provider Note    Event Date/Time   First MD Initiated Contact with Patient 10/04/22 1219     (approximate)   History   Knee Pain   HPI  Robin Mckinney is a 61 y.o. female who presents with complaints of left knee pain.  Patient reports every once in a while her left knee swells up on her and causes her pain, in the past that has been related to a Baker's cyst.  She thinks that may be causing her discomfort again today.  She denies calf pain or swelling.  Has not take anything for this     Physical Exam   Triage Vital Signs: ED Triage Vitals  Enc Vitals Group     BP 10/04/22 1039 (!) 152/113     Pulse Rate 10/04/22 1039 67     Resp 10/04/22 1039 20     Temp 10/04/22 1039 98.5 F (36.9 C)     Temp Source 10/04/22 1039 Oral     SpO2 10/04/22 1039 99 %     Weight 10/04/22 1038 64 kg (141 lb 1.5 oz)     Height 10/04/22 1038 1.6 m (5\' 3" )     Head Circumference --      Peak Flow --      Pain Score 10/04/22 1038 10     Pain Loc --      Pain Edu? --      Excl. in GC? --     Most recent vital signs: Vitals:   10/04/22 1039  BP: (!) 152/113  Pulse: 67  Resp: 20  Temp: 98.5 F (36.9 C)  SpO2: 99%     General: Awake, no distress.  CV:  Good peripheral perfusion.  Resp:  Normal effort.  Abd:  No distention.  Other:  Left knee: Mild swelling, no erythema, no pain with flexion, no rash   ED Results / Procedures / Treatments   Labs (all labs ordered are listed, but only abnormal results are displayed) Labs Reviewed - No data to display   EKG     RADIOLOGY     PROCEDURES:  Critical Care performed:   Procedures   MEDICATIONS ORDERED IN ED: Medications - No data to display   IMPRESSION / MDM / ASSESSMENT AND PLAN / ED COURSE  I reviewed the triage vital signs and the nursing notes. Patient's presentation is most consistent with exacerbation of chronic illness.  Patient with a history of Baker's cyst,  arthritis presents with left knee pain.  No indication of infection or septic joint.  Mild swelling noted, suspect exacerbation of arthritis, have strongly recommended that she follow-up with orthopedics no indication for further workup or admission at this time.        FINAL CLINICAL IMPRESSION(S) / ED DIAGNOSES   Final diagnoses:  Acute pain of left knee     Rx / DC Orders   ED Discharge Orders          Ordered    naproxen (NAPROSYN) 500 MG tablet  2 times daily with meals        10/04/22 1225             Note:  This document was prepared using Dragon voice recognition software and may include unintentional dictation errors.   10/06/22, MD 10/04/22 [phone]

## 2022-10-19 ENCOUNTER — Emergency Department: Payer: Medicaid Other

## 2022-10-19 ENCOUNTER — Inpatient Hospital Stay
Admission: EM | Admit: 2022-10-19 | Discharge: 2022-10-22 | DRG: 390 | Disposition: A | Discharge status: Home / Self Care | Payer: Medicaid Other | Attending: Osteopathic Medicine | Admitting: Osteopathic Medicine

## 2022-10-19 ENCOUNTER — Other Ambulatory Visit: Payer: Self-pay

## 2022-10-19 DIAGNOSIS — E876 Hypokalemia: Secondary | ICD-10-CM | POA: Diagnosis not present

## 2022-10-19 DIAGNOSIS — K56609 Unspecified intestinal obstruction, unspecified as to partial versus complete obstruction: Secondary | ICD-10-CM

## 2022-10-19 DIAGNOSIS — I10 Essential (primary) hypertension: Secondary | ICD-10-CM | POA: Diagnosis present

## 2022-10-19 DIAGNOSIS — K219 Gastro-esophageal reflux disease without esophagitis: Secondary | ICD-10-CM | POA: Diagnosis present

## 2022-10-19 DIAGNOSIS — Z7982 Long term (current) use of aspirin: Secondary | ICD-10-CM

## 2022-10-19 DIAGNOSIS — F17200 Nicotine dependence, unspecified, uncomplicated: Secondary | ICD-10-CM | POA: Diagnosis present

## 2022-10-19 DIAGNOSIS — I2510 Atherosclerotic heart disease of native coronary artery without angina pectoris: Secondary | ICD-10-CM | POA: Diagnosis present

## 2022-10-19 DIAGNOSIS — F17210 Nicotine dependence, cigarettes, uncomplicated: Secondary | ICD-10-CM | POA: Diagnosis present

## 2022-10-19 DIAGNOSIS — Z8249 Family history of ischemic heart disease and other diseases of the circulatory system: Secondary | ICD-10-CM

## 2022-10-19 DIAGNOSIS — Z1152 Encounter for screening for COVID-19: Secondary | ICD-10-CM

## 2022-10-19 DIAGNOSIS — Z79899 Other long term (current) drug therapy: Secondary | ICD-10-CM | POA: Diagnosis not present

## 2022-10-19 DIAGNOSIS — K56600 Partial intestinal obstruction, unspecified as to cause: Secondary | ICD-10-CM | POA: Diagnosis not present

## 2022-10-19 DIAGNOSIS — I2584 Coronary atherosclerosis due to calcified coronary lesion: Secondary | ICD-10-CM

## 2022-10-19 DIAGNOSIS — F1290 Cannabis use, unspecified, uncomplicated: Secondary | ICD-10-CM | POA: Diagnosis present

## 2022-10-19 HISTORY — DX: Atherosclerotic heart disease of native coronary artery without angina pectoris: I25.10

## 2022-10-19 HISTORY — DX: Tobacco use: Z72.0

## 2022-10-19 HISTORY — DX: History of uterine scar from previous surgery: Z98.891

## 2022-10-19 HISTORY — DX: Gastro-esophageal reflux disease without esophagitis: K21.9

## 2022-10-19 LAB — COMPREHENSIVE METABOLIC PANEL
ALT: 11 U/L (ref 0–44)
AST: 22 U/L (ref 15–41)
Albumin: 3.9 g/dL (ref 3.5–5.0)
Alkaline Phosphatase: 62 U/L (ref 38–126)
Anion gap: 10 (ref 5–15)
BUN: 12 mg/dL (ref 8–23)
CO2: 27 mmol/L (ref 22–32)
Calcium: 8.9 mg/dL (ref 8.9–10.3)
Chloride: 99 mmol/L (ref 98–111)
Creatinine, Ser: 0.77 mg/dL (ref 0.44–1.00)
GFR, Estimated: >60 mL/min (ref >60)
Glucose, Bld: 119 mg/dL — ABNORMAL HIGH (ref 70–99)
Potassium: 3.6 mmol/L (ref 3.5–5.1)
Sodium: 136 mmol/L (ref 135–145)
Total Bilirubin: 0.6 mg/dL (ref 0.3–1.2)
Total Protein: 7.6 g/dL (ref 6.5–8.1)

## 2022-10-19 LAB — APTT: aPTT: 30 seconds (ref 24–36)

## 2022-10-19 LAB — CBC WITH DIFFERENTIAL/PLATELET
Abs Immature Granulocytes: 0.01 10*3/uL (ref 0.00–0.07)
Basophils Absolute: 0 10*3/uL (ref 0.0–0.1)
Basophils Relative: 1 %
Eosinophils Absolute: 0 10*3/uL (ref 0.0–0.5)
Eosinophils Relative: 0 %
HCT: 33.8 % — ABNORMAL LOW (ref 36.0–46.0)
Hemoglobin: 10.9 g/dL — ABNORMAL LOW (ref 12.0–15.0)
Immature Granulocytes: 0 %
Lymphocytes Relative: 19 %
Lymphs Abs: 1.1 10*3/uL (ref 0.7–4.0)
MCH: 28.6 pg (ref 26.0–34.0)
MCHC: 32.2 g/dL (ref 30.0–36.0)
MCV: 88.7 fL (ref 80.0–100.0)
Monocytes Absolute: 0.3 10*3/uL (ref 0.1–1.0)
Monocytes Relative: 5 %
Neutro Abs: 4.4 10*3/uL (ref 1.7–7.7)
Neutrophils Relative %: 75 %
Platelets: 416 10*3/uL — ABNORMAL HIGH (ref 150–400)
RBC: 3.81 MIL/uL — ABNORMAL LOW (ref 3.87–5.11)
RDW: 14.2 % (ref 11.5–15.5)
WBC: 5.9 10*3/uL (ref 4.0–10.5)
nRBC: 0 % (ref 0.0–0.2)

## 2022-10-19 LAB — RESP PANEL BY RT-PCR (RSV, FLU A&B, COVID)  RVPGX2
Influenza A by PCR: NEGATIVE
Influenza B by PCR: NEGATIVE
Resp Syncytial Virus by PCR: NEGATIVE
SARS Coronavirus 2 by RT PCR: NEGATIVE

## 2022-10-19 LAB — URINALYSIS, ROUTINE W REFLEX MICROSCOPIC
Bilirubin Urine: NEGATIVE
Glucose, UA: 50 mg/dL — AB
Hgb urine dipstick: NEGATIVE
Ketones, ur: 5 mg/dL — AB
Leukocytes,Ua: NEGATIVE
Nitrite: NEGATIVE
Protein, ur: 30 mg/dL — AB
Specific Gravity, Urine: 1.014 (ref 1.005–1.030)
pH: 8 (ref 5.0–8.0)

## 2022-10-19 LAB — TROPONIN I (HIGH SENSITIVITY)
Troponin I (High Sensitivity): 6 ng/L (ref <18)
Troponin I (High Sensitivity): 6 ng/L (ref <18)

## 2022-10-19 LAB — PROTIME-INR
INR: 1.1 (ref 0.8–1.2)
Prothrombin Time: 14.4 seconds (ref 11.4–15.2)

## 2022-10-19 LAB — HIV ANTIBODY (ROUTINE TESTING W REFLEX): HIV Screen 4th Generation wRfx: NONREACTIVE

## 2022-10-19 LAB — LIPASE, BLOOD: Lipase: 46 U/L (ref 11–51)

## 2022-10-19 MED ORDER — LACTATED RINGERS IV BOLUS
1000.0000 mL | Freq: Once | INTRAVENOUS | Status: AC
  Administered 2022-10-19: 1000 mL via INTRAVENOUS

## 2022-10-19 MED ORDER — LISINOPRIL-HYDROCHLOROTHIAZIDE 10-12.5 MG PO TABS: 1.0000 | ORAL_TABLET | Freq: Every day | ORAL | Status: DC

## 2022-10-19 MED ORDER — MORPHINE SULFATE (PF) 2 MG/ML IV SOLN: 2.0000 mg | INTRAVENOUS | Status: DC | PRN

## 2022-10-19 MED ORDER — HYDROCHLOROTHIAZIDE 12.5 MG PO TABS
12.5000 mg | ORAL_TABLET | Freq: Every day | ORAL | Status: DC
  Administered 2022-10-19 – 2022-10-20 (×2): 12.5 mg via ORAL
  Filled 2022-10-19 (×2): qty 1

## 2022-10-19 MED ORDER — HEPARIN SODIUM (PORCINE) 5000 UNIT/ML IJ SOLN
5000.0000 [IU] | Freq: Three times a day (TID) | INTRAMUSCULAR | Status: DC
  Administered 2022-10-19 – 2022-10-22 (×9): 5000 [IU] via SUBCUTANEOUS
  Filled 2022-10-19 (×9): qty 1

## 2022-10-19 MED ORDER — OXYCODONE-ACETAMINOPHEN 5-325 MG PO TABS
1.0000 | ORAL_TABLET | ORAL | Status: DC | PRN
  Administered 2022-10-22 (×2): 1 via ORAL
  Filled 2022-10-19 (×2): qty 1

## 2022-10-19 MED ORDER — SODIUM CHLORIDE 0.9 % IV SOLN: INTRAVENOUS | Status: DC

## 2022-10-19 MED ORDER — DROPERIDOL 2.5 MG/ML IJ SOLN
1.2500 mg | Freq: Once | INTRAMUSCULAR | Status: AC
  Administered 2022-10-19: 1.25 mg via INTRAVENOUS
  Filled 2022-10-19: qty 2

## 2022-10-19 MED ORDER — NICOTINE 21 MG/24HR TD PT24
21.0000 mg | MEDICATED_PATCH | Freq: Every day | TRANSDERMAL | Status: DC
  Administered 2022-10-21 – 2022-10-22 (×2): 21 mg via TRANSDERMAL
  Filled 2022-10-19 (×3): qty 1

## 2022-10-19 MED ORDER — IOHEXOL 300 MG/ML  SOLN
100.0000 mL | Freq: Once | INTRAMUSCULAR | Status: AC | PRN
  Administered 2022-10-19: 100 mL via INTRAVENOUS

## 2022-10-19 MED ORDER — MORPHINE SULFATE (PF) 2 MG/ML IV SOLN
1.0000 mg | INTRAVENOUS | Status: DC | PRN
  Administered 2022-10-20 (×3): 1 mg via INTRAVENOUS
  Filled 2022-10-19 (×3): qty 1

## 2022-10-19 MED ORDER — ACETAMINOPHEN 325 MG PO TABS: 650.0000 mg | ORAL_TABLET | Freq: Four times a day (QID) | ORAL | Status: DC | PRN

## 2022-10-19 MED ORDER — LISINOPRIL 10 MG PO TABS
10.0000 mg | ORAL_TABLET | Freq: Every day | ORAL | Status: DC
  Administered 2022-10-19 – 2022-10-20 (×2): 10 mg via ORAL
  Filled 2022-10-19 (×2): qty 1

## 2022-10-19 MED ORDER — ONDANSETRON HCL 4 MG/2ML IJ SOLN
4.0000 mg | Freq: Three times a day (TID) | INTRAMUSCULAR | Status: DC | PRN
  Administered 2022-10-20 – 2022-10-21 (×2): 4 mg via INTRAVENOUS
  Filled 2022-10-19 (×2): qty 2

## 2022-10-19 MED ORDER — HYDRALAZINE HCL 20 MG/ML IJ SOLN
5.0000 mg | INTRAMUSCULAR | Status: DC | PRN
  Administered 2022-10-21: 5 mg via INTRAVENOUS
  Filled 2022-10-19: qty 1

## 2022-10-19 MED ORDER — ASPIRIN 325 MG PO TBEC
325.0000 mg | DELAYED_RELEASE_TABLET | Freq: Every day | ORAL | Status: DC
  Administered 2022-10-19 – 2022-10-22 (×4): 325 mg via ORAL
  Filled 2022-10-19 (×4): qty 1

## 2022-10-19 NOTE — ED Notes (Signed)
Patient assisted to make phone call.  Reports she was unable to "make it to bathroom" and urinated on self.  Provided with change of clothing

## 2022-10-19 NOTE — H&P (Signed)
History and Physical    TAILER VOLKERT TIW:[id number] DOB: Feb 22, 1961 DOA: 10/19/2022  Referring MD/NP/PA:   PCP: [facility]   Patient coming from:  The patient is coming from home.  At baseline, pt is independent for most of ADL.        Chief Complaint: Nausea, vomiting, abdominal pain  HPI: Robin Mckinney is a 62 y.o. female with medical history significant of hypertension, CAD, GERD, smoker, who presents with nausea, vomiting, abdominal pain.  Patient states that she has abdominal pain for more than 10 days. Hs has nausea and intermittent nonbilious nonbloody vomiting.  Last bowel movement was 3 days ago.  Her abdominal pain is diffuse, constant, moderate, sharp, radiating to bilateral flank area.  Patient does not have fever or chills.  No chest pain, cough, shortness breath.Of note,  patient received 1.25 mg of droperidol ED, she was drowsy during interview, but arousable, when aroused, patient is oriented x 3.  Data reviewed independently and ED Course: pt was found to have WBC 5.9, GFR> 60, temperature normal, blood pressure 2 3/107, 139/87, heart rate 73, RR 18, oxygen saturation 97% on room air.  Patient is admitted to MedSurg bed as inpatient.  Dr. Dolores Frame of surgery is consulted  CT-abd/pelvis: 1. Findings are concerning for probable early or partial small bowel obstruction. Transition point not confidently identified, but likely in the distal jejunum or proximal ileum. 2. Tiny umbilical hernia containing only a small amount of omental fat. 3. Small fluid collection in the right inguinal region, favored to represent a benign lesion potentially related to prior vascular access. Correlation with clinical history is suggested. Follow-up nonemergent outpatient ultrasound of this region could be considered to ensure the benign nature of this finding. 4. Cardiomegaly. 5. Aortic atherosclerosis, in addition to at least right coronary artery  disease. Please note that although the presence of coronary artery calcium documents the presence of coronary artery disease, the severity of this disease and any potential stenosis cannot be assessed on this non-gated CT examination. Assessment for potential risk factor modification, dietary therapy or pharmacologic therapy may be warranted, if clinically indicated.    EKG:  Not done in ED, will get one.     Review of Systems:   General: no fevers, chills, no body weight gain, has poor appetite, has fatigue HEENT: no blurry vision, hearing changes or sore throat Respiratory: no dyspnea, coughing, wheezing CV: no chest pain, no palpitations GI: has nausea, vomiting, abdominal pain, no diarrhea GU: no dysuria, burning on urination, increased urinary frequency, hematuria  Ext: no leg edema Neuro: no unilateral weakness, numbness, or tingling, no vision change or hearing loss Skin: no rash, no skin tear. MSK: No muscle spasm, no deformity, no limitation of range of movement in spin Heme: No easy bruising.  Travel history: No recent long distant travel.   Allergy: No Known Allergies  Past Medical History:  Diagnosis Date   CAD (coronary artery disease)    GERD (gastroesophageal reflux disease)    H/O cesarean section    Hypertension    Tobacco abuse     Past Surgical History:  Procedure Laterality Date   CESAREAN SECTION      Social History:  reports that she has been smoking cigarettes. She has been smoking an average of .5 packs per day. She has never used smokeless tobacco. She reports current alcohol use. She reports that she does not use drugs.  Family History:  Family History  Problem Relation  Age of Onset   Hypertension Mother    Hypertension Father      Prior to Admission medications   Medication Sig Start Date End Date Taking? Authorizing Provider  aluminum-magnesium hydroxide-simethicone (MAALOX) [phone] MG/5ML SUSP Take 30 mLs by mouth 4 (four) times  daily -  before meals and at bedtime. 03/03/19   Carrie Mew, MD  aspirin EC 325 MG tablet Take 1 tablet (325 mg total) by mouth daily. 03/03/19   Carrie Mew, MD  famotidine (PEPCID) 20 MG tablet Take 1 tablet (20 mg total) by mouth 2 (two) times daily. 03/03/19   Carrie Mew, MD  lisinopril-hydrochlorothiazide (PRINZIDE,ZESTORETIC) 10-12.5 MG tablet Take 1 tablet by mouth daily.    [provider]  lisinopril-hydrochlorothiazide (ZESTORETIC) 10-12.5 MG tablet Take 1 tablet by mouth daily for 30 days. 03/03/19 04/02/19  Carrie Mew, MD  naproxen (NAPROSYN) 500 MG tablet Take 1 tablet (500 mg total) by mouth 2 (two) times daily with a meal. 10/04/22   Lavonia Drafts, MD    Physical Exam: Vitals:   10/19/22 0307 10/19/22 0400 10/19/22 0520 10/19/22 0907  BP: (!) 140/105 (!) 203/107 139/87 (!) 120/93  Pulse: 73  65 63  Resp: 18  16 16   Temp: 98.2 F (36.8 C)   98.1 F (36.7 C)  TempSrc:    Oral  SpO2: 97%  97% 100%  Weight: 64.4 kg     Height: 5\' 3"  (1.6 m)      General: Not in acute distress HEENT:       Eyes: PERRL, EOMI, no scleral icterus.       ENT: No discharge from the ears and nose       Neck: No JVD, no bruit, no mass felt. Heme: No neck lymph node enlargement. Cardiac: S1/S2, RRR, No murmurs, No gallops or rubs. Respiratory: No rales, wheezing, rhonchi or rubs. GI: Soft, nondistended, has diffused abdominal tenderness, no rebound pain, no organomegaly, BS present. GU: No hematuria Ext: No pitting leg edema bilaterally. 1+DP/PT pulse bilaterally. Musculoskeletal: No joint deformities, No joint redness or warmth, no limitation of ROM in spin. Skin: No rashes.  Neuro: Drowsy, arousable, when aroused patient is oriented X3, cranial nerves II-XII grossly intact, moves all extremities normally. Psych: Patient is not psychotic, no suicidal or hemocidal ideation.  Labs on Admission: I have personally reviewed following labs and imaging  studies  CBC: Recent Labs  Lab 10/19/22 0314  WBC 5.9  NEUTROABS 4.4  HGB 10.9*  HCT 33.8*  MCV 88.7  PLT 630*   Basic Metabolic Panel: Recent Labs  Lab 10/19/22 0314  NA 136  K 3.6  CL 99  CO2 27  GLUCOSE 119*  BUN 12  CREATININE 0.77  CALCIUM 8.9   GFR: Estimated Creatinine Clearance: 65.8 mL/min (by C-G formula based on SCr of 0.77 mg/dL). Liver Function Tests: Recent Labs  Lab 10/19/22 0314  AST 22  ALT 11  ALKPHOS 62  BILITOT 0.6  PROT 7.6  ALBUMIN 3.9   Recent Labs  Lab 10/19/22 0314  LIPASE 46   No results for input(s): "AMMONIA" in the last 168 hours. Coagulation Profile: No results for input(s): "INR", "PROTIME" in the last 168 hours. Cardiac Enzymes: No results for input(s): "CKTOTAL", "CKMB", "CKMBINDEX", "TROPONINI" in the last 168 hours. BNP (last 3 results) No results for input(s): "PROBNP" in the last 8760 hours. HbA1C: No results for input(s): "HGBA1C" in the last 72 hours. CBG: No results for input(s): "GLUCAP" in the last 168 hours. Lipid  Profile: No results for input(s): "CHOL", "HDL", "LDLCALC", "TRIG", "CHOLHDL", "LDLDIRECT" in the last 72 hours. Thyroid Function Tests: No results for input(s): "TSH", "T4TOTAL", "FREET4", "T3FREE", "THYROIDAB" in the last 72 hours. Anemia Panel: No results for input(s): "VITAMINB12", "FOLATE", "FERRITIN", "TIBC", "IRON", "RETICCTPCT" in the last 72 hours. Urine analysis:    Component Value Date/Time   COLORURINE YELLOW (A) 10/19/2022 0513   APPEARANCEUR HAZY (A) 10/19/2022 0513   LABSPEC 1.014 10/19/2022 0513   PHURINE 8.0 10/19/2022 0513   GLUCOSEU 50 (A) 10/19/2022 0513   HGBUR NEGATIVE 10/19/2022 0513   BILIRUBINUR NEGATIVE 10/19/2022 0513   KETONESUR 5 (A) 10/19/2022 0513   PROTEINUR 30 (A) 10/19/2022 0513   UROBILINOGEN 0.2 09/05/2009 1200   NITRITE NEGATIVE 10/19/2022 0513   LEUKOCYTESUR NEGATIVE 10/19/2022 0513   Sepsis Labs: @LABRCNTIP (procalcitonin:4,lacticidven:4) ) Recent  Results (from the past 240 hour(s))  Resp panel by RT-PCR (RSV, Flu A&B, Covid) Anterior Nasal Swab     Status: None   Collection Time: 10/19/22  3:14 AM   Specimen: Anterior Nasal Swab  Result Value Ref Range Status   SARS Coronavirus 2 by RT PCR NEGATIVE NEGATIVE Final    Comment: (NOTE) SARS-CoV-2 target nucleic acids are NOT DETECTED.  The SARS-CoV-2 RNA is generally detectable in upper respiratory specimens during the acute phase of infection. The lowest concentration of SARS-CoV-2 viral copies this assay can detect is 138 copies/mL. A negative result does not preclude SARS-Cov-2 infection and should not be used as the sole basis for treatment or other patient management decisions. A negative result may occur with  improper specimen collection/handling, submission of specimen other than nasopharyngeal swab, presence of viral mutation(s) within the areas targeted by this assay, and inadequate number of viral copies(<138 copies/mL). A negative result must be combined with clinical observations, patient history, and epidemiological information. The expected result is Negative.  Fact Sheet for Patients:  12/18/22  Fact Sheet for Healthcare Providers:  BloggerCourse.com  This test is no t yet approved or cleared by the [employer] FDA and  has been authorized for detection and/or diagnosis of SARS-CoV-2 by FDA under an Emergency Use Authorization (EUA). This EUA will remain  in effect (meaning this test can be used) for the duration of the COVID-19 declaration under Section 564(b)(1) of the Act, 21 U.S.C.section 360bbb-3(b)(1), unless the authorization is terminated  or revoked sooner.       Influenza A by PCR NEGATIVE NEGATIVE Final   Influenza B by PCR NEGATIVE NEGATIVE Final    Comment: (NOTE) The Xpert Xpress SARS-CoV-2/FLU/RSV plus assay is intended as an aid in the diagnosis of influenza from Nasopharyngeal  swab specimens and should not be used as a sole basis for treatment. Nasal washings and aspirates are unacceptable for Xpert Xpress SARS-CoV-2/FLU/RSV testing.  Fact Sheet for Patients: [place]  Fact Sheet for Healthcare Providers: BloggerCourse.com  This test is not yet approved or cleared by the [employer] FDA and has been authorized for detection and/or diagnosis of SARS-CoV-2 by FDA under an Emergency Use Authorization (EUA). This EUA will remain in effect (meaning this test can be used) for the duration of the COVID-19 declaration under Section 564(b)(1) of the Act, 21 U.S.C. section 360bbb-3(b)(1), unless the authorization is terminated or revoked.     Resp Syncytial Virus by PCR NEGATIVE NEGATIVE Final    Comment: (NOTE) Fact Sheet for Patients: [place]  Fact Sheet for Healthcare Providers: BloggerCourse.com  This test is not yet approved or cleared by the [employer] FDA  and has been authorized for detection and/or diagnosis of SARS-CoV-2 by FDA under an Emergency Use Authorization (EUA). This EUA will remain in effect (meaning this test can be used) for the duration of the COVID-19 declaration under Section 564(b)(1) of the Act, 21 U.S.C. section 360bbb-3(b)(1), unless the authorization is terminated or revoked.  Performed at [facility], [address] [place],  [address]      Radiological Exams on Admission: CT ABDOMEN PELVIS W CONTRAST  Result Date: 10/19/2022 CLINICAL DATA:  62 year old female with history of recurrent emesis and upper abdominal pain. EXAM: CT ABDOMEN AND PELVIS WITH CONTRAST TECHNIQUE: Multidetector CT imaging of the abdomen and pelvis was performed using the standard protocol following bolus administration of intravenous contrast. RADIATION DOSE REDUCTION: This exam was performed according to the  departmental dose-optimization program which includes automated exposure control, adjustment of the mA and/or kV according to patient size and/or use of iterative reconstruction technique. CONTRAST:  121mL OMNIPAQUE IOHEXOL 300 MG/ML  SOLN COMPARISON:  No priors. FINDINGS: Lower chest: Cardiomegaly. Atherosclerotic calcification in the right coronary artery. Hepatobiliary: No definite suspicious cystic or solid hepatic lesions. No intra or extrahepatic biliary ductal dilatation. Gallbladder is unremarkable in appearance. Pancreas: No pancreatic mass. No pancreatic ductal dilatation. No pancreatic or peripancreatic fluid collections or inflammatory changes. Spleen: Unremarkable. Adrenals/Urinary Tract: Bilateral kidneys and bilateral adrenal glands are normal in appearance. No hydroureteronephrosis. Urinary bladder is unremarkable in appearance. Stomach/Bowel: Stomach is moderately distended. There are numerous prominent borderline dilated loops of small bowel in the left side of the abdomen, with several small air-fluid levels. Distal small bowel appears completely decompressed. An exact transition point is not confidently identified, but likely in the distal jejunum or proximal ileum. Some gas and stool are noted throughout the colon. Colon is nondilated. Normal appendix. Vascular/Lymphatic: Atherosclerosis in the abdominal aorta and pelvic vasculature, without evidence of aneurysm or dissection. No definite lymphadenopathy confidently identified in the abdomen or pelvis. Reproductive: Tiny calcification in the fundus of the uterus likely represents a small calcified fibroid. Uterus and ovaries are otherwise unremarkable in appearance. Other: Tiny umbilical hernia containing a small amount of omental fat. No significant volume of ascites. No pneumoperitoneum. Smoothly marginated low-attenuation lesion in the right inguinal region with adjacent calcification (axial image 77 of series 2), nonspecific, but relatively  benign in appearance, potentially chronic seroma or old hematoma related to prior vascular access. Musculoskeletal: There are no aggressive appearing lytic or blastic lesions noted in the visualized portions of the skeleton. IMPRESSION: 1. Findings are concerning for probable early or partial small bowel obstruction. Transition point not confidently identified, but likely in the distal jejunum or proximal ileum. 2. Tiny umbilical hernia containing only a small amount of omental fat. 3. Small fluid collection in the right inguinal region, favored to represent a benign lesion potentially related to prior vascular access. Correlation with clinical history is suggested. Follow-up nonemergent outpatient ultrasound of this region could be considered to ensure the benign nature of this finding. 4. Cardiomegaly. 5. Aortic atherosclerosis, in addition to at least right coronary artery disease. Please note that although the presence of coronary artery calcium documents the presence of coronary artery disease, the severity of this disease and any potential stenosis cannot be assessed on this non-gated CT examination. Assessment for potential risk factor modification, dietary therapy or pharmacologic therapy may be warranted, if clinically indicated. Electronically Signed   By: Vinnie Langton M.D.   On: 10/19/2022 05:54      Assessment/Plan Principal  Problem:   Partial small bowel obstruction (HCC) Active Problems:   Benign essential HTN   Coronary artery calcification   Smoker   Assessment and Plan:  Partial small bowel obstruction [facility]): Patient does not have active vomiting currently.  Consulted to Dr. Dolores Frame of surgery  -Admit to med surg bed -NPO   -Hold off NG tube since patient does not have active vomiting.   - prn morphine, Percocet and Tylenol for pain -Prn Zofran prn nausea   -IVF: 1L of LR, then NS 75 cc/h -INR/PTT/ -Follow-up general surgeons recommendation  Benign essential HTN -IV  hydralazine as needed -Zestoretic  Coronary artery calcification -Aspirin  Smoker -Nicotine patch  Fluid in R inguinal region: CT showed small fluid collection in the right inguinal region, favored to represent a benign lesion potentially related to prior vascular access.  -need to f/u with PCP to get outpatient ultrasound of this region to ensure the benign nature of this finding.    DVT ppx: SQ Heparin        Code Status: Full code  Family Communication: not done, no family member is at bed side.       Disposition Plan:  Anticipate discharge back to previous environment  Consults called:  Dr. Dolores Frame of surgery  Admission status and Level of care: Med-Surg:    as inpt       Dispo: The patient is from: Home              Anticipated d/c is to: Home              Anticipated d/c date is: 2 days              Patient currently is not medically stable to d/c.    Severity of Illness:  The appropriate patient status for this patient is INPATIENT. Inpatient status is judged to be reasonable and necessary in order to provide the required intensity of service to ensure the patient's safety. The patient's presenting symptoms, physical exam findings, and initial radiographic and laboratory data in the context of their chronic comorbidities is felt to place them at high risk for further clinical deterioration. Furthermore, it is not anticipated that the patient will be medically stable for discharge from the hospital within 2 midnights of admission.   * I certify that at the point of admission it is my clinical judgment that the patient will require inpatient hospital care spanning beyond 2 midnights from the point of admission due to high intensity of service, high risk for further deterioration and high frequency of surveillance required.*       Date of Service 10/19/2022    Lorretta Harp [facility]   If 7PM-7AM, please contact night-coverage [email or web address] 10/19/2022,  12:14 PM

## 2022-10-19 NOTE — Consult Note (Signed)
[facility] SURGICAL CONSULTATION NOTE (initial) - cpt: [id number]   HISTORY OF PRESENT ILLNESS (HPI):  62 y.o. female presented to [facility] ED today for evaluation of emesis. Patient reports around a 10 day history of vomiting. She is report 1-3 episodes daily over this time. This has been non-bilious and non-bloody. She endorses associated upper abdominal pain and flank pain as well, but feels this has been secondary to her vomiting episodes. No associated fever, chills, cough, CP, SOB, or urinary changes. She continues to pass flatus and does endorse "normal" bowel movements, including prior to arrival. No previous history of bowel obstructions. No recent travel or abdominal food/water exposure. She denied any previous abdominal surgeries but prior c-section is noted. Work up in the ED revealed a normal WBC to 5.9K, renal function normal with sCr - 0.77, no electrolyte derangements, lab work was otherwise reassuring. CT Abdomen/Pelvis was obtained and showed loops of dilated small bowel more in upper abdomen, no clear transition zone, decompressed bowel distally with air and stool in colon. She was ultimately admitted to the hospitalist service for intractable nausea/emesis and possible pSBO.   Surgery is consulted by hospitalist physician Dr. Ivor Costa, MD in this context for evaluation and management of possible pSBO.  PAST MEDICAL HISTORY (PMH):  Past Medical History:  Diagnosis Date   Hypertension      PAST SURGICAL HISTORY ([place]):  History reviewed. No pertinent surgical history.   MEDICATIONS:  Prior to Admission medications   Medication Sig Start Date End Date Taking? Authorizing Provider  aspirin EC 325 MG tablet Take 1 tablet (325 mg total) by mouth daily. 03/03/19  Yes Carrie Mew, MD  lisinopril-hydrochlorothiazide (PRINZIDE,ZESTORETIC) 10-12.5 MG tablet Take 1 tablet by mouth daily.   Yes [provider]  naproxen (NAPROSYN) 500 MG tablet Take 1 tablet (500  mg total) by mouth 2 (two) times daily with a meal. 10/04/22  Yes Lavonia Drafts, MD  aluminum-magnesium hydroxide-simethicone (MAALOX) 564-332-95 MG/5ML SUSP Take 30 mLs by mouth 4 (four) times daily -  before meals and at bedtime. Patient not taking: Reported on 10/19/2022 03/03/19   Carrie Mew, MD  famotidine (PEPCID) 20 MG tablet Take 1 tablet (20 mg total) by mouth 2 (two) times daily. Patient not taking: Reported on 10/19/2022 03/03/19   Carrie Mew, MD  lisinopril-hydrochlorothiazide (ZESTORETIC) 10-12.5 MG tablet Take 1 tablet by mouth daily for 30 days. 03/03/19 04/02/19  Carrie Mew, MD     ALLERGIES:  No Known Allergies   SOCIAL HISTORY:  Social History   Socioeconomic History   Marital status: Single    Spouse name: Not on file   Number of children: Not on file   Years of education: Not on file   Highest education level: Not on file  Occupational History   Not on file  Tobacco Use   Smoking status: Every Day    Packs/day: 0.50    Types: Cigarettes   Smokeless tobacco: Never  Substance and Sexual Activity   Alcohol use: Yes   Drug use: No   Sexual activity: Not on file  Other Topics Concern   Not on file  Social History Narrative   Not on file   Social Determinants of Health   Financial Resource Strain: Not on file  Food Insecurity: Not on file  Transportation Needs: Not on file  Physical Activity: Not on file  Stress: Not on file  Social Connections: Not on file  Intimate Partner Violence: Not on file  FAMILY HISTORY:  History reviewed. No pertinent family history.    REVIEW OF SYSTEMS:  Review of Systems  Constitutional:  Negative for chills and fever.  HENT:  Negative for congestion and sore throat.   Respiratory:  Negative for cough and shortness of breath.   Cardiovascular:  Negative for chest pain and palpitations.  Gastrointestinal:  Positive for nausea and vomiting. Negative for abdominal pain, constipation and diarrhea.   Genitourinary:  Negative for dysuria and urgency.  All other systems reviewed and are negative.   VITAL SIGNS:  Temp:  [98.2 F (36.8 C)] 98.2 F (36.8 C) (01/12 0307) Pulse Rate:  [65-73] 65 (01/12 0520) Resp:  [16-18] 16 (01/12 0520) BP: (139-203)/(87-107) 139/87 (01/12 0520) SpO2:  [97 %-100 %] 97 % (01/12 0520) Weight:  [64.4 kg] 64.4 kg (01/12 0307)     Height: 5\' 3"  (160 cm) Weight: 64.4 kg BMI (Calculated): 25.16   INTAKE/OUTPUT:  No intake/output data recorded.  PHYSICAL EXAM:  Physical Exam Vitals and nursing note reviewed. Exam conducted with a chaperone present.  Constitutional:      General: She is not in acute distress.    Appearance: Normal appearance. She is not ill-appearing.     Comments: Patient is very somnolent, arouses to verbal stimuli but falls back asleep multiple times during interview and exam  HENT:     Head: Normocephalic and atraumatic.  Eyes:     General: No scleral icterus.    Conjunctiva/sclera: Conjunctivae normal.  Cardiovascular:     Rate and Rhythm: Normal rate.     Pulses: Normal pulses.     Heart sounds: No murmur heard. Pulmonary:     Effort: Pulmonary effort is normal. No respiratory distress.     Breath sounds: Normal breath sounds.  Abdominal:     General: Abdomen is flat. There is no distension.     Palpations: Abdomen is soft.     Tenderness: There is no abdominal tenderness. There is no guarding or rebound.     Comments: Abdomen is soft, no appreciable tenderness, non-distended, no rebound/guarding   Genitourinary:    Comments: Deferred Musculoskeletal:     Right lower leg: No edema.     Left lower leg: No edema.  Skin:    General: Skin is warm and dry.  Neurological:     General: No focal deficit present.     Mental Status: She is alert.     Comments: Somnolent  Psychiatric:     Comments: Somnolent, difficult to reliably assess      Labs:     Latest Ref Rng & Units 10/19/2022    3:14 AM 05/07/2022    2:54 PM  04/28/2020    8:47 AM  CBC  WBC 4.0 - 10.5 K/uL 5.9  6.2  7.4   Hemoglobin 12.0 - 15.0 g/dL 10.9  12.7  13.4   Hematocrit 36.0 - 46.0 % 33.8  39.2  40.9   Platelets 150 - 400 K/uL 416  321  266       Latest Ref Rng & Units 10/19/2022    3:14 AM 05/07/2022    2:54 PM 04/28/2020    8:47 AM  CMP  Glucose 70 - 99 mg/dL 119  108  98   BUN 8 - 23 mg/dL 12  19  9    Creatinine 0.44 - 1.00 mg/dL 0.77  1.02  0.81   Sodium 135 - 145 mmol/L 136  141  137   Potassium 3.5 - 5.1 mmol/L 3.6  3.8  3.1   Chloride 98 - 111 mmol/L 99  104  100   CO2 22 - 32 mmol/L 27  28  26    Calcium 8.9 - 10.3 mg/dL 8.9  9.4  8.9   Total Protein 6.5 - 8.1 g/dL 7.6     Total Bilirubin 0.3 - 1.2 mg/dL 0.6     Alkaline Phos 38 - 126 U/L 62     AST 15 - 41 U/L 22     ALT 0 - 44 U/L 11       Imaging studies:   CT Abdomen/Pelvis (10/19/2021) personally reviewed which shows dilated loops of bowel in upper abdomen, no gross transition zone appreciable, there is no free air nor pneumatosis, there is gas and stool in colon, distal small bowel decompressed, and radiologist report reviewed below:  IMPRESSION: 1. Findings are concerning for probable early or partial small bowel obstruction. Transition point not confidently identified, but likely in the distal jejunum or proximal ileum. 2. Tiny umbilical hernia containing only a small amount of omental fat. 3. Small fluid collection in the right inguinal region, favored to represent a benign lesion potentially related to prior vascular access. Correlation with clinical history is suggested. Follow-up nonemergent outpatient ultrasound of this region could be considered to ensure the benign nature of this finding. 4. Cardiomegaly. 5. Aortic atherosclerosis, in addition to at least right coronary artery disease. Please note that although the presence of coronary artery calcium documents the presence of coronary artery disease, the severity of this disease and any potential  stenosis cannot be assessed on this non-gated CT examination. Assessment for potential risk factor modification, dietary therapy or pharmacologic therapy may be warranted, if clinically indicated.   Assessment/Plan: (ICD-10's: K23.609) 62 y.o. female with intractable nausea/emesis, now improving, admitted with possible pSBO although favor enteritis -vs- hyperemesis from cannabis abuse as she is having no abdominal pain, no clear transitions on imaging, and she continues to have bowel function.   - For now, recommend NPO. Okay for sips with meds, water, ice chips - No role for NGT decompression currently; symptoms improving - No indication for surgical intervention - Continue IVF support - Monitor abdominal examination; on-going bowel function   - Will get morning KUB   - Pain control prn; minimize narcotics  - Antiemetics prn  - Mobilize as tolerated  - Further management per primary service; we will follow   All of the above findings and recommendations were discussed with the patient, and all of patient's questions were answered to her expressed satisfaction.  Thank you for the opportunity to participate in this patient's care.   -- 67, PA-C [facility] 10/19/2022, 8:51 AM M-F: 7am - 4pm

## 2022-10-19 NOTE — ED Provider Notes (Signed)
[facility] Provider Note    Event Date/Time   First MD Initiated Contact with Patient 10/19/22 [phone]     (approximate)   History   Vomiting   HPI  Robin Mckinney is a 62 y.o. female who presents to the ED for evaluation of Vomiting   Patient presents to the ED for evaluation of 1.5 weeks of emesis.  She reports throwing up 1-3 times per day for the past 10 or 12 days.  Reports developing some upper abdominal bilateral flank discomfort over the past couple days that she attributes to this recurrent emesis and retching.  Did not report pain primarily.  Reports continued passage of flatus  Denies any stool changes, bowel movements yesterday, formed and "normal."  No urinary changes such as dysuria.  No fevers.  She has had a cesarean section in the past, but no other intra-abdominal surgeries.  Does report smoking cannabis regularly, though much less than she used to when she was younger   Physical Exam   Triage Vital Signs: ED Triage Vitals  Enc Vitals Group     BP 10/19/22 0307 (!) 140/105     Pulse Rate 10/19/22 0307 73     Resp 10/19/22 0307 18     Temp 10/19/22 0307 98.2 F (36.8 C)     Temp src --      SpO2 10/19/22 0304 100 %     Weight 10/19/22 0307 142 lb (64.4 kg)     Height 10/19/22 0307 5\' 3"  (1.6 m)     Head Circumference --      Peak Flow --      Pain Score 10/19/22 0307 8     Pain Loc --      Pain Edu? --      Excl. in [place]? --     Most recent vital signs: Vitals:   10/19/22 0400 10/19/22 0520  BP: (!) 203/107 139/87  Pulse:  65  Resp:  16  Temp:    SpO2:  97%    General: Awake, no distress.  CV:  Good peripheral perfusion.  Resp:  Normal effort.  Abd:  No distention.  Diffuse and mild abdominal tenderness without localizing or peritoneal features. MSK:  No deformity noted.  Neuro:  No focal deficits appreciated. Other:     ED Results / Procedures / Treatments   Labs (all labs ordered are listed, but only  abnormal results are displayed) Labs Reviewed  COMPREHENSIVE METABOLIC PANEL - Abnormal; Notable for the following components:      Result Value   Glucose, Bld 119 (*)    All other components within normal limits  URINALYSIS, ROUTINE W REFLEX MICROSCOPIC - Abnormal; Notable for the following components:   Color, Urine YELLOW (*)    APPearance HAZY (*)    Glucose, UA 50 (*)    Ketones, ur 5 (*)    Protein, ur 30 (*)    Bacteria, UA RARE (*)    All other components within normal limits  CBC WITH DIFFERENTIAL/PLATELET - Abnormal; Notable for the following components:   RBC 3.81 (*)    Hemoglobin 10.9 (*)    HCT 33.8 (*)    Platelets 416 (*)    All other components within normal limits  RESP PANEL BY RT-PCR (RSV, FLU A&B, COVID)  RVPGX2  LIPASE, BLOOD  TROPONIN I (HIGH SENSITIVITY)  TROPONIN I (HIGH SENSITIVITY)    EKG   RADIOLOGY CT abdomen/pelvis interpreted by me with some areas  of dilated loops of bowel, but no clear transition point  Official radiology report(s): CT ABDOMEN PELVIS W CONTRAST  Result Date: 10/19/2022 CLINICAL DATA:  62 year old female with history of recurrent emesis and upper abdominal pain. EXAM: CT ABDOMEN AND PELVIS WITH CONTRAST TECHNIQUE: Multidetector CT imaging of the abdomen and pelvis was performed using the standard protocol following bolus administration of intravenous contrast. RADIATION DOSE REDUCTION: This exam was performed according to the departmental dose-optimization program which includes automated exposure control, adjustment of the mA and/or kV according to patient size and/or use of iterative reconstruction technique. CONTRAST:  144mL OMNIPAQUE IOHEXOL 300 MG/ML  SOLN COMPARISON:  No priors. FINDINGS: Lower chest: Cardiomegaly. Atherosclerotic calcification in the right coronary artery. Hepatobiliary: No definite suspicious cystic or solid hepatic lesions. No intra or extrahepatic biliary ductal dilatation. Gallbladder is unremarkable in  appearance. Pancreas: No pancreatic mass. No pancreatic ductal dilatation. No pancreatic or peripancreatic fluid collections or inflammatory changes. Spleen: Unremarkable. Adrenals/Urinary Tract: Bilateral kidneys and bilateral adrenal glands are normal in appearance. No hydroureteronephrosis. Urinary bladder is unremarkable in appearance. Stomach/Bowel: Stomach is moderately distended. There are numerous prominent borderline dilated loops of small bowel in the left side of the abdomen, with several small air-fluid levels. Distal small bowel appears completely decompressed. An exact transition point is not confidently identified, but likely in the distal jejunum or proximal ileum. Some gas and stool are noted throughout the colon. Colon is nondilated. Normal appendix. Vascular/Lymphatic: Atherosclerosis in the abdominal aorta and pelvic vasculature, without evidence of aneurysm or dissection. No definite lymphadenopathy confidently identified in the abdomen or pelvis. Reproductive: Tiny calcification in the fundus of the uterus likely represents a small calcified fibroid. Uterus and ovaries are otherwise unremarkable in appearance. Other: Tiny umbilical hernia containing a small amount of omental fat. No significant volume of ascites. No pneumoperitoneum. Smoothly marginated low-attenuation lesion in the right inguinal region with adjacent calcification (axial image 77 of series 2), nonspecific, but relatively benign in appearance, potentially chronic seroma or old hematoma related to prior vascular access. Musculoskeletal: There are no aggressive appearing lytic or blastic lesions noted in the visualized portions of the skeleton. IMPRESSION: 1. Findings are concerning for probable early or partial small bowel obstruction. Transition point not confidently identified, but likely in the distal jejunum or proximal ileum. 2. Tiny umbilical hernia containing only a small amount of omental fat. 3. Small fluid collection  in the right inguinal region, favored to represent a benign lesion potentially related to prior vascular access. Correlation with clinical history is suggested. Follow-up nonemergent outpatient ultrasound of this region could be considered to ensure the benign nature of this finding. 4. Cardiomegaly. 5. Aortic atherosclerosis, in addition to at least right coronary artery disease. Please note that although the presence of coronary artery calcium documents the presence of coronary artery disease, the severity of this disease and any potential stenosis cannot be assessed on this non-gated CT examination. Assessment for potential risk factor modification, dietary therapy or pharmacologic therapy may be warranted, if clinically indicated. Electronically Signed   By: Vinnie Langton M.D.   On: 10/19/2022 05:54    PROCEDURES and INTERVENTIONS:  Procedures  Medications  lactated ringers bolus 1,000 mL (1,000 mLs Intravenous New Bag/Given 10/19/22 0518)  droperidol (INAPSINE) 2.5 MG/ML injection 1.25 mg (1.25 mg Intravenous Given 10/19/22 0515)  iohexol (OMNIPAQUE) 300 MG/ML solution 100 mL (100 mLs Intravenous Contrast Given 10/19/22 0523)     IMPRESSION / MDM / ASSESSMENT AND PLAN / ED COURSE  I reviewed  the triage vital signs and the nursing notes.  Differential diagnosis includes, but is not limited to, SBO, cannabis hyperemesis syndrome, ACS, dehydration, cystitis  {Patient presents with symptoms of an acute illness or injury that is potentially life-threatening.  62 year old woman s/p cesarean section x 2 presents with acute pain superimposed on 10 days of emesis, with evidence of SBO requiring medical admission.  Blood work is reassuring with no leukocytosis or signs of sepsis.  Negative lipase and metabolic panel.  Urine with some ketones suggestive of dehydration, but no infectious features.  2 troponins are negative.  CT scan questions partial or early small bowel obstruction.  As below despite  her resolution of primary symptoms, she is still quite tender on exam.  I will consult medicine for admission.  Clinical Course as of 10/19/22 3810  Caleen Essex Oct 19, 2022  0600 Reassessed.  Reports resolution of symptoms and feeling much better.  She is appreciative.  We discussed some discordance of the CT findings and her clinical picture.  She is eager for p.o. challenge and hopeful outpatient management.  No interest at this point of any observation admission.  I reexamined her abdomen and she reports feeling much better, she is nontender throughout with no tenderness. [DS]  1751 Reassessed.  Reports feeling "okay" and when I reexamined her she is diffusely tender.  I recommend admission and she is agreeable [DS]    Clinical Course User Index [DS] Delton Prairie, MD     FINAL CLINICAL IMPRESSION(S) / ED DIAGNOSES   Final diagnoses:  SBO (small bowel obstruction) (HCC)     Rx / DC Orders   ED Discharge Orders     None        Note:  This document was prepared using Dragon voice recognition software and may include unintentional dictation errors.   Delton Prairie, MD 10/19/22 [phone]

## 2022-10-19 NOTE — ED Triage Notes (Signed)
[facility]   Pt woke up and attempted to make it to work. Had several episodes of vomiting, feels week, and generalized ill feeling.   EMS admin 4mg  Zofran IM with improvement in nausea.

## 2022-10-20 ENCOUNTER — Inpatient Hospital Stay: Payer: Medicaid Other

## 2022-10-20 DIAGNOSIS — K56600 Partial intestinal obstruction, unspecified as to cause: Secondary | ICD-10-CM | POA: Diagnosis not present

## 2022-10-20 DIAGNOSIS — K56609 Unspecified intestinal obstruction, unspecified as to partial versus complete obstruction: Secondary | ICD-10-CM | POA: Diagnosis not present

## 2022-10-20 LAB — BASIC METABOLIC PANEL
Anion gap: 12 (ref 5–15)
BUN: 17 mg/dL (ref 8–23)
CO2: 24 mmol/L (ref 22–32)
Calcium: 8.7 mg/dL — ABNORMAL LOW (ref 8.9–10.3)
Chloride: 100 mmol/L (ref 98–111)
Creatinine, Ser: 0.79 mg/dL (ref 0.44–1.00)
GFR, Estimated: >60 mL/min (ref >60)
Glucose, Bld: 76 mg/dL (ref 70–99)
Potassium: 3.3 mmol/L — ABNORMAL LOW (ref 3.5–5.1)
Sodium: 136 mmol/L (ref 135–145)

## 2022-10-20 LAB — CBC
HCT: 32.5 % — ABNORMAL LOW (ref 36.0–46.0)
Hemoglobin: 10.6 g/dL — ABNORMAL LOW (ref 12.0–15.0)
MCH: 28.9 pg (ref 26.0–34.0)
MCHC: 32.6 g/dL (ref 30.0–36.0)
MCV: 88.6 fL (ref 80.0–100.0)
Platelets: 417 10*3/uL — ABNORMAL HIGH (ref 150–400)
RBC: 3.67 MIL/uL — ABNORMAL LOW (ref 3.87–5.11)
RDW: 13.9 % (ref 11.5–15.5)
WBC: 6 10*3/uL (ref 4.0–10.5)
nRBC: 0 % (ref 0.0–0.2)

## 2022-10-20 NOTE — Progress Notes (Signed)
[facility] SURGICAL PROGRESS NOTE (cpt [phone])  Hospital Day(s): 1.   Post op day(s):  Marland Kitchen   Interval History: Patient seen and examined, no acute events or new complaints overnight. Patient reports flatus, denies bowel movement, nausea or vomiting.  Last emesis on arrival in ED.  Still reports appetite/hunger and wants to try eating.  Denies abdominal pain, cramping.  Only prior abdominal pain is subcostal area, which she is convinced is due to her dry heaving/retching.    Review of Systems:  Constitutional: denies fever, chills  HEENT: denies cough or congestion  Respiratory: denies any shortness of breath  Cardiovascular: denies chest pain or palpitations  Gastrointestinal: denies abdominal pain, N/V, or diarrhea/and bowel function as per interval history Genitourinary: denies burning with urination or urinary frequency Musculoskeletal: denies pain, decreased motor or sensation Integumentary: denies any other rashes or skin discolorations Neurological: denies HA or vision/hearing changes   Vital signs in last 24 hours: [min-max] current  Temp:  [97.9 F (36.6 C)-99 F (37.2 C)] 99 F (37.2 C) (01/13 0352) Pulse Rate:  [62-80] 64 (01/13 0352) Resp:  [14-18] 18 (01/13 0352) BP: (120-162)/(74-115) 162/94 (01/13 0408) SpO2:  [99 %-100 %] 100 % (01/13 0352)     Height: 5\' 3"  (160 cm) Weight: 64.4 kg BMI (Calculated): 25.16   Intake/Output last 2 shifts:  01/12 0701 - 01/13 0700 In: 2502.6 [I.V.:1502.6; IV Piggyback:1000] Out: -   One unmeasured Uo.   Physical Exam:  Constitutional: alert, cooperative and no distress  HENT: normocephalic without obvious abnormality  Eyes: PERRL, EOM's grossly intact and symmetric  Neuro: CN II - XII grossly intact and symmetric without deficit  Respiratory: breathing non-labored at rest  Cardiovascular: regular rate and sinus rhythm  Gastrointestinal: soft, non-tender, and non-distended Musculoskeletal: UE and LE FROM, no edema  or wounds, motor and sensation grossly intact, NT    Labs:     Latest Ref Rng & Units 10/20/2022    7:50 AM 10/19/2022    3:14 AM 05/07/2022    2:54 PM  CBC  WBC 4.0 - 10.5 K/uL 6.0  5.9  6.2   Hemoglobin 12.0 - 15.0 g/dL 10.6  10.9  12.7   Hematocrit 36.0 - 46.0 % 32.5  33.8  39.2   Platelets 150 - 400 K/uL 417  416  321       Latest Ref Rng & Units 10/20/2022    7:50 AM 10/19/2022    3:14 AM 05/07/2022    2:54 PM  CMP  Glucose 70 - 99 mg/dL 76  119  108   BUN 8 - 23 mg/dL 17  12  19    Creatinine 0.44 - 1.00 mg/dL 0.79  0.77  1.02   Sodium 135 - 145 mmol/L 136  136  141   Potassium 3.5 - 5.1 mmol/L 3.3  3.6  3.8   Chloride 98 - 111 mmol/L 100  99  104   CO2 22 - 32 mmol/L 24  27  28    Calcium 8.9 - 10.3 mg/dL 8.7  8.9  9.4   Total Protein 6.5 - 8.1 g/dL  7.6    Total Bilirubin 0.3 - 1.2 mg/dL  0.6    Alkaline Phos 38 - 126 U/L  62    AST 15 - 41 U/L  22    ALT 0 - 44 U/L  11       Imaging studies: CLINICAL DATA:  62 year old female with possible early or partial small bowel obstruction on  CT Abdomen and Pelvis yesterday.   EXAM: DG ABDOMEN ACUTE WITH 1 VIEW CHEST   COMPARISON:  CT Abdomen and Pelvis 10/19/2022 and earlier.   FINDINGS: PA view of the chest at 0642 hours. Stable cardiac size and mediastinal contours. Tortuous thoracic aorta. Lung volumes within normal limits. No pneumothorax, pneumoperitoneum, confluent opacity. Visualized tracheal air column is within normal limits.   Upright and supine views of the abdomen and pelvis at 0653 hours. Unchanged retained oral contrast in the right colon. Nonobstructed bowel-gas pattern, mildly improved from the CT scout view yesterday. No pneumoperitoneum. Stable abdominal and pelvic visceral contours. Stable visualized osseous structures.   IMPRESSION: 1.  Non obstructed bowel gas pattern, no free air. 2.  No acute cardiopulmonary abnormality.     Electronically Signed   By: Genevie Ann M.D.   On: 10/20/2022  07:00   Assessment/Plan:  62 y.o. female with admission for nausea/vomiting with question of partial small bowel obstruction on CT scanning imaging, complicated by pertinent comorbidities including: Patient Active Problem List   Diagnosis Date Noted   Partial small bowel obstruction ([place]) 10/19/2022   Numbness and tingling of left hand 10/18/2017   Smoker 10/18/2017   Coronary artery calcification 10/18/2017   Chest pain 10/17/2017   Benign essential HTN 10/17/2017     -I believe we can initiate a clear liquid diet, and advance as tolerated.  -My index of suspicion for even partial small bowel obstruction remains quite low.  -Will see how she tolerates a diet, prior to considering any additional imaging at this time.  -Will follow with you.    -- Ronny Bacon M.D., [facility] 10/20/2022 11:09 AM

## 2022-10-20 NOTE — Hospital Course (Addendum)
62 y.o. female presented to [facility] ED today for evaluation of emesis. Patient reports around a 10 day history of vomiting. She is report 1-3 episodes daily over this time. This has been non-bilious and non-bloody. She endorses associated upper abdominal pain and flank pain as well, but feels this has been secondary to her vomiting episodes. No associated fever, chills, cough, CP, SOB, or urinary changes. She continues to pass flatus and does endorse "normal" bowel movements, including prior to arrival.   01/12: in ED normal WBC to 5.9K, renal function normal with sCr - 0.77, no electrolyte derangements, lab work was otherwise reassuring. CT Abdomen/Pelvis was obtained and showed loops of dilated small bowel more in upper abdomen, no clear transition zone, decompressed bowel distally with air and stool in colon. She was ultimately admitted to the hospitalist service for intractable nausea/emesis and possible pSBO. General surgery consulted. To remain NPO except sips/ice, no surgical intervention, no NGT needed, continue IV fluids, follow AM KUB 01/13: K 3.3 replaced, rest of BMP and CBC unremarkable. KUB Non obstructed bowel gas pattern, no free air.  Initiate CLD, advance as tolerated.  Surgery team saw her this morning, doubt SBO, defer further imaging for now 01/14: tolerating diet but limited intake d/t nausea and still has not had BM. Encouraged OOB and ambulating.   Consultants:  General Surgery   Procedures: none      ASSESSMENT & PLAN:   Principal Problem:   Partial small bowel obstruction (HCC) Active Problems:   Benign essential HTN   Coronary artery calcification   Smoker  Partial small bowel obstruction ([place]):  Patient does not have active vomiting currently.   Passing flatus but has not had BM General surgery following - no procedures at this time NPO --> CLD --> regular diet No need for NG tube since patient does not have active vomiting.   prn morphine, Percocet and Tylenol  for pain but minimize opiates Prn Zofran prn nausea   IVF discontinued ambulation or at minimum OOB to chair encouraged   Benign essential HTN IV hydralazine as needed Lisinopril + HCTZ --> lisinopril dose increased today but BP softer, will go back to 10 mg home dose    Hypokalemia Replaced Monitor BMP  Coronary artery calcification Aspirin   Smoker Nicotine patch   Fluid in R inguinal region: CT showed small fluid collection in the right inguinal region, favored to represent a benign lesion potentially related to prior vascular access.  need to f/u with PCP to get outpatient ultrasound of this region to ensure the benign nature of this finding.      DVT prophylaxis: sq heparin  Pertinent IV fluids/nutrition: regular diet  Central lines / invasive devices: none   Code Status: FULL CODE   Current Admission Status: inpatient  TOC needs / Dispo plan: none, anticipate dc back home  Barriers to discharge / significant pending items: await appropriate BM, advance diet, dc iv fluids, dc possible tomorrow  or once achieves BM and tolerating diet

## 2022-10-20 NOTE — Plan of Care (Signed)
  Problem: Health Behavior/Discharge Planning: Goal: Ability to manage health-related needs will improve Outcome: Progressing

## 2022-10-20 NOTE — Progress Notes (Signed)
PROGRESS NOTE    Robin Mckinney   KPT:[id number] DOB: 02-04-1961  DOA: 10/19/2022 Date of Service: 10/20/22 PCP: [facility]     Brief Narrative / Hospital Course:  62 y.o. female presented to [facility] ED today for evaluation of emesis. Patient reports around a 10 day history of vomiting. She is report 1-3 episodes daily over this time. This has been non-bilious and non-bloody. She endorses associated upper abdominal pain and flank pain as well, but feels this has been secondary to her vomiting episodes. No associated fever, chills, cough, CP, SOB, or urinary changes. She continues to pass flatus and does endorse "normal" bowel movements, including prior to arrival.   01/12: in ED normal WBC to 5.9K, renal function normal with sCr - 0.77, no electrolyte derangements, lab work was otherwise reassuring. CT Abdomen/Pelvis was obtained and showed loops of dilated small bowel more in upper abdomen, no clear transition zone, decompressed bowel distally with air and stool in colon. She was ultimately admitted to the hospitalist service for intractable nausea/emesis and possible pSBO. General surgery consulted. To remain NPO except sips/ice, no surgical intervention, no NGT needed, continue IV fluids, follow AM KUB 01/13: K 3.3 replaced, rest of BMP and CBC unremarkable. KUB Non obstructed bowel gas pattern, no free air.  Initiate CLD, advance as tolerated.  Surgery team saw her this morning, doubt SBO, defer further imaging for now  Consultants:  General Surgery   Procedures: none      ASSESSMENT & PLAN:   Principal Problem:   Partial small bowel obstruction (HCC) Active Problems:   Benign essential HTN   Coronary artery calcification   Smoker  Partial small bowel obstruction (HCC):  Patient does not have active vomiting currently.   Passing flatus but has not had BM General surgery following - no procedures at this time CLD Hold off NG tube since patient  does not have active vomiting.   prn morphine, Percocet and Tylenol for pain but minimize opiates Prn Zofran prn nausea   IVF discontinued ambulation or OOB to chair encouraged   Benign essential HTN IV hydralazine as needed Zestoretic   Coronary artery calcification Aspirin   Smoker Nicotine patch   Fluid in R inguinal region: CT showed small fluid collection in the right inguinal region, favored to represent a benign lesion potentially related to prior vascular access.  need to f/u with PCP to get outpatient ultrasound of this region to ensure the benign nature of this finding.      DVT prophylaxis: sq heparin  Pertinent IV fluids/nutrition: was on NS 75 mL/h while NPO, this dc and advance diet today  Central lines / invasive devices: none   Code Status: FULL CODE   Current Admission Status: inpatient  TOC needs / Dispo plan: none, anticipate dc back home  Barriers to discharge / significant pending items: await appropriate BM, advance diet, dc iv fluids, dc possible tomorrow  or once achieves BM and tolerating diet              Subjective / Brief ROS:  Patient reports feeling tired today, still having abdominal soreness but no significant pain, denies nausea Denies CP/SOB.  Pain controlled.  Denies new weakness.  Inquires about advancing diet.  Reports no concerns w/ urination/defecation.   Family Communication: Patient declined call to family/support persons at this time    Objective Findings:  Vitals:   10/19/22 1625 10/19/22 2039 10/20/22 0352 10/20/22 0408  BP: (!) 144/95 134/74 Marland Kitchen)  161/115 (!) 162/94  Pulse: 62 73 64   Resp: 14 16 18    Temp: 97.9 F (36.6 C) 98.2 F (36.8 C) 99 F (37.2 C)   TempSrc: Oral Oral Oral   SpO2: 100% 99% 100%   Weight:      Height:        Intake/Output Summary (Last 24 hours) at 10/20/2022 1330 Last data filed at 10/20/2022 0450 Gross per 24 hour  Intake 1502.64 ml  Output --  Net 1502.64 ml   Filed  Weights   10/19/22 0307  Weight: 64.4 kg    Examination:  Physical Exam Constitutional:      General: She is not in acute distress.    Appearance: She is not ill-appearing.  Cardiovascular:     Rate and Rhythm: Normal rate and regular rhythm.     Heart sounds: Normal heart sounds.  Pulmonary:     Effort: No respiratory distress.     Breath sounds: Normal breath sounds.  Abdominal:     General: Abdomen is flat. There is no distension.     Palpations: Abdomen is soft.     Tenderness: There is no abdominal tenderness.  Skin:    General: Skin is warm and dry.  Neurological:     General: No focal deficit present.     Mental Status: She is alert and oriented to person, place, and time.  Psychiatric:        Mood and Affect: Mood normal.        Behavior: Behavior normal.          Scheduled Medications:   aspirin EC  325 mg Oral Daily   heparin  5,000 Units Subcutaneous Q8H   lisinopril  10 mg Oral Daily   And   hydrochlorothiazide  12.5 mg Oral Daily   nicotine  21 mg Transdermal Daily    Continuous Infusions:   PRN Medications:  acetaminophen, hydrALAZINE, morphine injection, ondansetron (ZOFRAN) IV, oxyCODONE-acetaminophen  Antimicrobials from admission:  Anti-infectives (From admission, onward)    None           Data Reviewed:  I have personally reviewed the following...  CBC: Recent Labs  Lab 10/19/22 0314 10/20/22 0750  WBC 5.9 6.0  NEUTROABS 4.4  --   HGB 10.9* 10.6*  HCT 33.8* 32.5*  MCV 88.7 88.6  PLT 416* 147*   Basic Metabolic Panel: Recent Labs  Lab 10/19/22 0314 10/20/22 0750  NA 136 136  K 3.6 3.3*  CL 99 100  CO2 27 24  GLUCOSE 119* 76  BUN 12 17  CREATININE 0.77 0.79  CALCIUM 8.9 8.7*   GFR: Estimated Creatinine Clearance: 65.8 mL/min (by C-G formula based on SCr of 0.79 mg/dL). Liver Function Tests: Recent Labs  Lab 10/19/22 0314  AST 22  ALT 11  ALKPHOS 62  BILITOT 0.6  PROT 7.6  ALBUMIN 3.9   Recent  Labs  Lab 10/19/22 0314  LIPASE 46   No results for input(s): "AMMONIA" in the last 168 hours. Coagulation Profile: Recent Labs  Lab 10/19/22 1526  INR 1.1   Cardiac Enzymes: No results for input(s): "CKTOTAL", "CKMB", "CKMBINDEX", "TROPONINI" in the last 168 hours. BNP (last 3 results) No results for input(s): "PROBNP" in the last 8760 hours. HbA1C: No results for input(s): "HGBA1C" in the last 72 hours. CBG: No results for input(s): "GLUCAP" in the last 168 hours. Lipid Profile: No results for input(s): "CHOL", "HDL", "LDLCALC", "TRIG", "CHOLHDL", "LDLDIRECT" in the last 72 hours. Thyroid Function  Tests: No results for input(s): "TSH", "T4TOTAL", "FREET4", "T3FREE", "THYROIDAB" in the last 72 hours. Anemia Panel: No results for input(s): "VITAMINB12", "FOLATE", "FERRITIN", "TIBC", "IRON", "RETICCTPCT" in the last 72 hours. Most Recent Urinalysis On File:     Component Value Date/Time   COLORURINE YELLOW (A) 10/19/2022 0513   APPEARANCEUR HAZY (A) 10/19/2022 0513   LABSPEC 1.014 10/19/2022 0513   PHURINE 8.0 10/19/2022 0513   GLUCOSEU 50 (A) 10/19/2022 0513   HGBUR NEGATIVE 10/19/2022 0513   BILIRUBINUR NEGATIVE 10/19/2022 0513   KETONESUR 5 (A) 10/19/2022 0513   PROTEINUR 30 (A) 10/19/2022 0513   UROBILINOGEN 0.2 09/05/2009 1200   NITRITE NEGATIVE 10/19/2022 0513   LEUKOCYTESUR NEGATIVE 10/19/2022 0513   Sepsis Labs: @LABRCNTIP (procalcitonin:4,lacticidven:4) Microbiology: Recent Results (from the past 240 hour(s))  Resp panel by RT-PCR (RSV, Flu A&B, Covid) Anterior Nasal Swab     Status: None   Collection Time: 10/19/22  3:14 AM   Specimen: Anterior Nasal Swab  Result Value Ref Range Status   SARS Coronavirus 2 by RT PCR NEGATIVE NEGATIVE Final    Comment: (NOTE) SARS-CoV-2 target nucleic acids are NOT DETECTED.  The SARS-CoV-2 RNA is generally detectable in upper respiratory specimens during the acute phase of infection. The lowest concentration of  SARS-CoV-2 viral copies this assay can detect is 138 copies/mL. A negative result does not preclude SARS-Cov-2 infection and should not be used as the sole basis for treatment or other patient management decisions. A negative result may occur with  improper specimen collection/handling, submission of specimen other than nasopharyngeal swab, presence of viral mutation(s) within the areas targeted by this assay, and inadequate number of viral copies(<138 copies/mL). A negative result must be combined with clinical observations, patient history, and epidemiological information. The expected result is Negative.  Fact Sheet for Patients:  12/18/22  Fact Sheet for Healthcare Providers:  BloggerCourse.com  This test is no t yet approved or cleared by the [employer] FDA and  has been authorized for detection and/or diagnosis of SARS-CoV-2 by FDA under an Emergency Use Authorization (EUA). This EUA will remain  in effect (meaning this test can be used) for the duration of the COVID-19 declaration under Section 564(b)(1) of the Act, 21 U.S.C.section 360bbb-3(b)(1), unless the authorization is terminated  or revoked sooner.       Influenza A by PCR NEGATIVE NEGATIVE Final   Influenza B by PCR NEGATIVE NEGATIVE Final    Comment: (NOTE) The Xpert Xpress SARS-CoV-2/FLU/RSV plus assay is intended as an aid in the diagnosis of influenza from Nasopharyngeal swab specimens and should not be used as a sole basis for treatment. Nasal washings and aspirates are unacceptable for Xpert Xpress SARS-CoV-2/FLU/RSV testing.  Fact Sheet for Patients: [place]  Fact Sheet for Healthcare Providers: BloggerCourse.com  This test is not yet approved or cleared by the [employer] FDA and has been authorized for detection and/or diagnosis of SARS-CoV-2 by FDA under an Emergency Use  Authorization (EUA). This EUA will remain in effect (meaning this test can be used) for the duration of the COVID-19 declaration under Section 564(b)(1) of the Act, 21 U.S.C. section 360bbb-3(b)(1), unless the authorization is terminated or revoked.     Resp Syncytial Virus by PCR NEGATIVE NEGATIVE Final    Comment: (NOTE) Fact Sheet for Patients: [place]  Fact Sheet for Healthcare Providers: BloggerCourse.com  This test is not yet approved or cleared by the [employer] FDA and has been authorized for detection and/or diagnosis of SARS-CoV-2 by FDA under an  Emergency Use Authorization (EUA). This EUA will remain in effect (meaning this test can be used) for the duration of the COVID-19 declaration under Section 564(b)(1) of the Act, 21 U.S.C. section 360bbb-3(b)(1), unless the authorization is terminated or revoked.  Performed at [facility], [address], [place], [place] [address]       Radiology Studies last 3 days: DG ABD ACUTE 2+V W 1V CHEST  Result Date: 10/20/2022 CLINICAL DATA:  62 year old female with possible early or partial small bowel obstruction on CT Abdomen and Pelvis yesterday. EXAM: DG ABDOMEN ACUTE WITH 1 VIEW CHEST COMPARISON:  CT Abdomen and Pelvis 10/19/2022 and earlier. FINDINGS: PA view of the chest at 0642 hours. Stable cardiac size and mediastinal contours. Tortuous thoracic aorta. Lung volumes within normal limits. No pneumothorax, pneumoperitoneum, confluent opacity. Visualized tracheal air column is within normal limits. Upright and supine views of the abdomen and pelvis at 0653 hours. Unchanged retained oral contrast in the right colon. Nonobstructed bowel-gas pattern, mildly improved from the CT scout view yesterday. No pneumoperitoneum. Stable abdominal and pelvic visceral contours. Stable visualized osseous structures. IMPRESSION: 1.  Non obstructed bowel gas pattern, no free air.  2.  No acute cardiopulmonary abnormality. Electronically Signed   By: Genevie Ann M.D.   On: 10/20/2022 07:00   CT ABDOMEN PELVIS W CONTRAST  Result Date: 10/19/2022 CLINICAL DATA:  62 year old female with history of recurrent emesis and upper abdominal pain. EXAM: CT ABDOMEN AND PELVIS WITH CONTRAST TECHNIQUE: Multidetector CT imaging of the abdomen and pelvis was performed using the standard protocol following bolus administration of intravenous contrast. RADIATION DOSE REDUCTION: This exam was performed according to the departmental dose-optimization program which includes automated exposure control, adjustment of the mA and/or kV according to patient size and/or use of iterative reconstruction technique. CONTRAST:  145mL OMNIPAQUE IOHEXOL 300 MG/ML  SOLN COMPARISON:  No priors. FINDINGS: Lower chest: Cardiomegaly. Atherosclerotic calcification in the right coronary artery. Hepatobiliary: No definite suspicious cystic or solid hepatic lesions. No intra or extrahepatic biliary ductal dilatation. Gallbladder is unremarkable in appearance. Pancreas: No pancreatic mass. No pancreatic ductal dilatation. No pancreatic or peripancreatic fluid collections or inflammatory changes. Spleen: Unremarkable. Adrenals/Urinary Tract: Bilateral kidneys and bilateral adrenal glands are normal in appearance. No hydroureteronephrosis. Urinary bladder is unremarkable in appearance. Stomach/Bowel: Stomach is moderately distended. There are numerous prominent borderline dilated loops of small bowel in the left side of the abdomen, with several small air-fluid levels. Distal small bowel appears completely decompressed. An exact transition point is not confidently identified, but likely in the distal jejunum or proximal ileum. Some gas and stool are noted throughout the colon. Colon is nondilated. Normal appendix. Vascular/Lymphatic: Atherosclerosis in the abdominal aorta and pelvic vasculature, without evidence of aneurysm or dissection.  No definite lymphadenopathy confidently identified in the abdomen or pelvis. Reproductive: Tiny calcification in the fundus of the uterus likely represents a small calcified fibroid. Uterus and ovaries are otherwise unremarkable in appearance. Other: Tiny umbilical hernia containing a small amount of omental fat. No significant volume of ascites. No pneumoperitoneum. Smoothly marginated low-attenuation lesion in the right inguinal region with adjacent calcification (axial image 77 of series 2), nonspecific, but relatively benign in appearance, potentially chronic seroma or old hematoma related to prior vascular access. Musculoskeletal: There are no aggressive appearing lytic or blastic lesions noted in the visualized portions of the skeleton. IMPRESSION: 1. Findings are concerning for probable early or partial small bowel obstruction. Transition point not confidently identified, but likely in the distal  jejunum or proximal ileum. 2. Tiny umbilical hernia containing only a small amount of omental fat. 3. Small fluid collection in the right inguinal region, favored to represent a benign lesion potentially related to prior vascular access. Correlation with clinical history is suggested. Follow-up nonemergent outpatient ultrasound of this region could be considered to ensure the benign nature of this finding. 4. Cardiomegaly. 5. Aortic atherosclerosis, in addition to at least right coronary artery disease. Please note that although the presence of coronary artery calcium documents the presence of coronary artery disease, the severity of this disease and any potential stenosis cannot be assessed on this non-gated CT examination. Assessment for potential risk factor modification, dietary therapy or pharmacologic therapy may be warranted, if clinically indicated. Electronically Signed   By: Trudie Reed M.D.   On: 10/19/2022 05:54             LOS: 1 day        Sunnie Nielsen, DO [facility] 10/20/2022, 1:30 PM    Dictation software may have been used to generate the above note. Typos may occur and escape review in typed/dictated notes. Please contact Dr Lyn Hollingshead directly for clarity if needed.  Staff may message me via secure chat in Epic  but this may not receive an immediate response,  please page me for urgent matters!  If 7PM-7AM, please contact night coverage [email or web address]

## 2022-10-21 ENCOUNTER — Inpatient Hospital Stay: Payer: Medicaid Other

## 2022-10-21 DIAGNOSIS — K56600 Partial intestinal obstruction, unspecified as to cause: Secondary | ICD-10-CM | POA: Diagnosis not present

## 2022-10-21 DIAGNOSIS — K56609 Unspecified intestinal obstruction, unspecified as to partial versus complete obstruction: Secondary | ICD-10-CM | POA: Diagnosis not present

## 2022-10-21 MED ORDER — POTASSIUM CHLORIDE CRYS ER 20 MEQ PO TBCR
40.0000 meq | EXTENDED_RELEASE_TABLET | Freq: Once | ORAL | Status: AC
  Administered 2022-10-21: 40 meq via ORAL
  Filled 2022-10-21: qty 2

## 2022-10-21 MED ORDER — CALCIUM CARBONATE ANTACID 500 MG PO CHEW
1.0000 | CHEWABLE_TABLET | Freq: Three times a day (TID) | ORAL | Status: DC | PRN
  Administered 2022-10-21 (×2): 200 mg via ORAL
  Filled 2022-10-21 (×2): qty 1

## 2022-10-21 MED ORDER — HYDROCHLOROTHIAZIDE 12.5 MG PO TABS
12.5000 mg | ORAL_TABLET | Freq: Every day | ORAL | Status: DC
  Administered 2022-10-22: 12.5 mg via ORAL
  Filled 2022-10-21: qty 1

## 2022-10-21 MED ORDER — POLYETHYLENE GLYCOL 3350 17 G PO PACK
17.0000 g | PACK | Freq: Every day | ORAL | Status: DC
  Administered 2022-10-21: 17 g via ORAL
  Filled 2022-10-21: qty 1

## 2022-10-21 MED ORDER — ONDANSETRON HCL 4 MG/2ML IJ SOLN
4.0000 mg | Freq: Four times a day (QID) | INTRAMUSCULAR | Status: DC | PRN
  Administered 2022-10-21: 8 mg via INTRAVENOUS
  Filled 2022-10-21: qty 4

## 2022-10-21 MED ORDER — MAGNESIUM HYDROXIDE 400 MG/5ML PO SUSP
30.0000 mL | Freq: Every day | ORAL | Status: DC
  Administered 2022-10-21: 30 mL via ORAL
  Filled 2022-10-21: qty 30

## 2022-10-21 MED ORDER — LISINOPRIL 20 MG PO TABS
20.0000 mg | ORAL_TABLET | Freq: Every day | ORAL | Status: DC
  Administered 2022-10-21: 20 mg via ORAL
  Filled 2022-10-21: qty 1

## 2022-10-21 MED ORDER — HYDROCHLOROTHIAZIDE 12.5 MG PO TABS
12.5000 mg | ORAL_TABLET | Freq: Every day | ORAL | Status: DC
  Administered 2022-10-21: 12.5 mg via ORAL
  Filled 2022-10-21: qty 1

## 2022-10-21 MED ORDER — LISINOPRIL 10 MG PO TABS
10.0000 mg | ORAL_TABLET | Freq: Every day | ORAL | Status: DC
  Administered 2022-10-22: 10 mg via ORAL
  Filled 2022-10-21: qty 1

## 2022-10-21 MED ORDER — HYDRALAZINE HCL 20 MG/ML IJ SOLN
5.0000 mg | Freq: Once | INTRAMUSCULAR | Status: AC
  Administered 2022-10-21: 5 mg via INTRAVENOUS
  Filled 2022-10-21: qty 1

## 2022-10-21 NOTE — Plan of Care (Signed)

## 2022-10-21 NOTE — Progress Notes (Addendum)
PROGRESS NOTE    Robin Mckinney   IPJ:[id number] DOB: [birth year]  DOA: 10/19/2022 Date of Service: 10/21/22 PCP: [facility]     Brief Narrative / Hospital Course:  62 y.o. female presented to [facility] ED today for evaluation of emesis. Patient reports around a 10 day history of vomiting. She is report 1-3 episodes daily over this time. This has been non-bilious and non-bloody. She endorses associated upper abdominal pain and flank pain as well, but feels this has been secondary to her vomiting episodes. No associated fever, chills, cough, CP, SOB, or urinary changes. She continues to pass flatus and does endorse "normal" bowel movements, including prior to arrival.   01/12: in ED normal WBC to 5.9K, renal function normal with sCr - 0.77, no electrolyte derangements, lab work was otherwise reassuring. CT Abdomen/Pelvis was obtained and showed loops of dilated small bowel more in upper abdomen, no clear transition zone, decompressed bowel distally with air and stool in colon. She was ultimately admitted to the hospitalist service for intractable nausea/emesis and possible pSBO. General surgery consulted. To remain NPO except sips/ice, no surgical intervention, no NGT needed, continue IV fluids, follow AM KUB 01/13: K 3.3 replaced, rest of BMP and CBC unremarkable. KUB Non obstructed bowel gas pattern, no free air.  Initiate CLD, advance as tolerated.  Surgery team saw her this morning, doubt SBO, defer further imaging for now 01/14: tolerating diet but limited intake d/t nausea and still has not had BM. Encouraged OOB and ambulating.   Consultants:  General Surgery   Procedures: none      ASSESSMENT & PLAN:   Principal Problem:   Partial small bowel obstruction (HCC) Active Problems:   Benign essential HTN   Coronary artery calcification   Smoker  Partial small bowel obstruction (HCC):  Patient does not have active vomiting currently.   Passing flatus  but has not had BM General surgery following - no procedures at this time NPO --> CLD --> regular diet No need for NG tube since patient does not have active vomiting.   prn morphine, Percocet and Tylenol for pain but minimize opiates Prn Zofran prn nausea   IVF discontinued ambulation or at minimum OOB to chair encouraged   Benign essential HTN IV hydralazine as needed Lisinopril + HCTZ --> lisinopril dose increased today but BP softer, will go back to 10 mg home dose    Hypokalemia Replaced Monitor BMP  Coronary artery calcification Aspirin   Smoker Nicotine patch   Fluid in R inguinal region: CT showed small fluid collection in the right inguinal region, favored to represent a benign lesion potentially related to prior vascular access.  need to f/u with PCP to get outpatient ultrasound of this region to ensure the benign nature of this finding.      DVT prophylaxis: sq heparin  Pertinent IV fluids/nutrition: regular diet  Central lines / invasive devices: none   Code Status: FULL CODE   Current Admission Status: inpatient  TOC needs / Dispo plan: none, anticipate dc back home  Barriers to discharge / significant pending items: await appropriate BM, advance diet, dc iv fluids, dc possible tomorrow  or once achieves BM and tolerating diet              Subjective / Brief ROS:  Patient reports feeling tired today, about same as yesterday - still having abdominal soreness but no significant pain, denies nausea Denies CP/SOB.  Pain controlled.  Denies new weakness.  Reports  no concerns w/ urination Still no defecation.   Family Communication: Patient declined call to family/support persons at this time    Objective Findings:  Vitals:   10/21/22 0625 10/21/22 0645 10/21/22 0732 10/21/22 1200  BP: (!) 180/125 (!) 188/113 (!) 135/92 (!) 112/92  Pulse:   81 91  Resp:   18 18  Temp:   98.3 F (36.8 C) 98.3 F (36.8 C)  TempSrc:   Oral Oral  SpO2:    100%   Weight:      Height:        Intake/Output Summary (Last 24 hours) at 10/21/2022 1421 Last data filed at 10/21/2022 1405 Gross per 24 hour  Intake 120 ml  Output --  Net 120 ml   Filed Weights   10/19/22 0307  Weight: 64.4 kg    Examination:  Physical Exam Constitutional:      General: She is not in acute distress.    Appearance: She is not ill-appearing.  Cardiovascular:     Rate and Rhythm: Normal rate and regular rhythm.     Heart sounds: Normal heart sounds.  Pulmonary:     Effort: No respiratory distress.     Breath sounds: Normal breath sounds.  Abdominal:     General: Abdomen is flat. There is no distension.     Palpations: Abdomen is soft.     Tenderness: There is no abdominal tenderness.  Skin:    General: Skin is warm and dry.  Neurological:     General: No focal deficit present.     Mental Status: She is alert and oriented to person, place, and time.  Psychiatric:        Mood and Affect: Mood normal.        Behavior: Behavior normal.          Scheduled Medications:   aspirin EC  325 mg Oral Daily   heparin  5,000 Units Subcutaneous Q8H   [START ON 10/22/2022] lisinopril  10 mg Oral Daily   And   [START ON 10/22/2022] hydrochlorothiazide  12.5 mg Oral Daily   nicotine  21 mg Transdermal Daily   potassium chloride  40 mEq Oral Once    Continuous Infusions:   PRN Medications:  acetaminophen, calcium carbonate, hydrALAZINE, morphine injection, ondansetron (ZOFRAN) IV, oxyCODONE-acetaminophen  Antimicrobials from admission:  Anti-infectives (From admission, onward)    None           Data Reviewed:  I have personally reviewed the following...  CBC: Recent Labs  Lab 10/19/22 0314 10/20/22 0750  WBC 5.9 6.0  NEUTROABS 4.4  --   HGB 10.9* 10.6*  HCT 33.8* 32.5*  MCV 88.7 88.6  PLT 416* 376*   Basic Metabolic Panel: Recent Labs  Lab 10/19/22 0314 10/20/22 0750  NA 136 136  K 3.6 3.3*  CL 99 100  CO2 27 24  GLUCOSE  119* 76  BUN 12 17  CREATININE 0.77 0.79  CALCIUM 8.9 8.7*   GFR: Estimated Creatinine Clearance: 65.8 mL/min (by C-G formula based on SCr of 0.79 mg/dL). Liver Function Tests: Recent Labs  Lab 10/19/22 0314  AST 22  ALT 11  ALKPHOS 62  BILITOT 0.6  PROT 7.6  ALBUMIN 3.9   Recent Labs  Lab 10/19/22 0314  LIPASE 46   No results for input(s): "AMMONIA" in the last 168 hours. Coagulation Profile: Recent Labs  Lab 10/19/22 1526  INR 1.1   Cardiac Enzymes: No results for input(s): "CKTOTAL", "CKMB", "CKMBINDEX", "TROPONINI" in the  last 168 hours. BNP (last 3 results) No results for input(s): "PROBNP" in the last 8760 hours. HbA1C: No results for input(s): "HGBA1C" in the last 72 hours. CBG: No results for input(s): "GLUCAP" in the last 168 hours. Lipid Profile: No results for input(s): "CHOL", "HDL", "LDLCALC", "TRIG", "CHOLHDL", "LDLDIRECT" in the last 72 hours. Thyroid Function Tests: No results for input(s): "TSH", "T4TOTAL", "FREET4", "T3FREE", "THYROIDAB" in the last 72 hours. Anemia Panel: No results for input(s): "VITAMINB12", "FOLATE", "FERRITIN", "TIBC", "IRON", "RETICCTPCT" in the last 72 hours. Most Recent Urinalysis On File:     Component Value Date/Time   COLORURINE YELLOW (A) 10/19/2022 0513   APPEARANCEUR HAZY (A) 10/19/2022 0513   LABSPEC 1.014 10/19/2022 0513   PHURINE 8.0 10/19/2022 0513   GLUCOSEU 50 (A) 10/19/2022 0513   HGBUR NEGATIVE 10/19/2022 0513   BILIRUBINUR NEGATIVE 10/19/2022 0513   KETONESUR 5 (A) 10/19/2022 0513   PROTEINUR 30 (A) 10/19/2022 0513   UROBILINOGEN 0.2 09/05/2009 1200   NITRITE NEGATIVE 10/19/2022 0513   LEUKOCYTESUR NEGATIVE 10/19/2022 0513   Sepsis Labs: @LABRCNTIP (procalcitonin:4,lacticidven:4) Microbiology: Recent Results (from the past 240 hour(s))  Resp panel by RT-PCR (RSV, Flu A&B, Covid) Anterior Nasal Swab     Status: None   Collection Time: 10/19/22  3:14 AM   Specimen: Anterior Nasal Swab  Result  Value Ref Range Status   SARS Coronavirus 2 by RT PCR NEGATIVE NEGATIVE Final    Comment: (NOTE) SARS-CoV-2 target nucleic acids are NOT DETECTED.  The SARS-CoV-2 RNA is generally detectable in upper respiratory specimens during the acute phase of infection. The lowest concentration of SARS-CoV-2 viral copies this assay can detect is 138 copies/mL. A negative result does not preclude SARS-Cov-2 infection and should not be used as the sole basis for treatment or other patient management decisions. A negative result may occur with  improper specimen collection/handling, submission of specimen other than nasopharyngeal swab, presence of viral mutation(s) within the areas targeted by this assay, and inadequate number of viral copies(<138 copies/mL). A negative result must be combined with clinical observations, patient history, and epidemiological information. The expected result is Negative.  Fact Sheet for Patients:  12/18/22  Fact Sheet for Healthcare Providers:  BloggerCourse.com  This test is no t yet approved or cleared by the [place] FDA and  has been authorized for detection and/or diagnosis of SARS-CoV-2 by FDA under an Emergency Use Authorization (EUA). This EUA will remain  in effect (meaning this test can be used) for the duration of the COVID-19 declaration under Section 564(b)(1) of the Act, 21 U.S.C.section 360bbb-3(b)(1), unless the authorization is terminated  or revoked sooner.       Influenza A by PCR NEGATIVE NEGATIVE Final   Influenza B by PCR NEGATIVE NEGATIVE Final    Comment: (NOTE) The Xpert Xpress SARS-CoV-2/FLU/RSV plus assay is intended as an aid in the diagnosis of influenza from Nasopharyngeal swab specimens and should not be used as a sole basis for treatment. Nasal washings and aspirates are unacceptable for Xpert Xpress SARS-CoV-2/FLU/RSV testing.  Fact Sheet for  Patients: [place]  Fact Sheet for Healthcare Providers: BloggerCourse.com  This test is not yet approved or cleared by the [place] FDA and has been authorized for detection and/or diagnosis of SARS-CoV-2 by FDA under an Emergency Use Authorization (EUA). This EUA will remain in effect (meaning this test can be used) for the duration of the COVID-19 declaration under Section 564(b)(1) of the Act, 21 U.S.C. section 360bbb-3(b)(1), unless the authorization is terminated or  revoked.     Resp Syncytial Virus by PCR NEGATIVE NEGATIVE Final    Comment: (NOTE) Fact Sheet for Patients: BloggerCourse.com  Fact Sheet for Healthcare Providers: [employer]ious[employer]ker.it  This test is not yet approved or cleared by the [place] FDA and has been authorized for detection and/or diagnosis of SARS-CoV-2 by FDA under an Emergency Use Authorization (EUA). This EUA will remain in effect (meaning this test can be used) for the duration of the COVID-19 declaration under Section 564(b)(1) of the Act, 21 U.S.C. section 360bbb-3(b)(1), unless the authorization is terminated or revoked.  Performed at [facility], [address], [place], [place] [address]       Radiology Studies last 3 days: DG ABD ACUTE 2+V W 1V CHEST  Result Date: 10/20/2022 CLINICAL DATA:  62 year old female with possible early or partial small bowel obstruction on CT Abdomen and Pelvis yesterday. EXAM: DG ABDOMEN ACUTE WITH 1 VIEW CHEST COMPARISON:  CT Abdomen and Pelvis 10/19/2022 and earlier. FINDINGS: PA view of the chest at 0642 hours. Stable cardiac size and mediastinal contours. Tortuous thoracic aorta. Lung volumes within normal limits. No pneumothorax, pneumoperitoneum, confluent opacity. Visualized tracheal air column is within normal limits. Upright and supine views of the abdomen and pelvis at 0653  hours. Unchanged retained oral contrast in the right colon. Nonobstructed bowel-gas pattern, mildly improved from the CT scout view yesterday. No pneumoperitoneum. Stable abdominal and pelvic visceral contours. Stable visualized osseous structures. IMPRESSION: 1.  Non obstructed bowel gas pattern, no free air. 2.  No acute cardiopulmonary abnormality. Electronically Signed   By: Odessa Fleming M.D.   On: 10/20/2022 07:00   CT ABDOMEN PELVIS W CONTRAST  Result Date: 10/19/2022 CLINICAL DATA:  62 year old female with history of recurrent emesis and upper abdominal pain. EXAM: CT ABDOMEN AND PELVIS WITH CONTRAST TECHNIQUE: Multidetector CT imaging of the abdomen and pelvis was performed using the standard protocol following bolus administration of intravenous contrast. RADIATION DOSE REDUCTION: This exam was performed according to the departmental dose-optimization program which includes automated exposure control, adjustment of the mA and/or kV according to patient size and/or use of iterative reconstruction technique. CONTRAST:  OMNIPAQUE IOHEXOL 300 MG/ML  SOLN COMPARISON:  No priors. FINDINGS: Lower chest: Cardiomegaly. Atherosclerotic calcification in the right coronary artery. Hepatobiliary: No definite suspicious cystic or solid hepatic lesions. No intra or extrahepatic biliary ductal dilatation. Gallbladder is unremarkable in appearance. Pancreas: No pancreatic mass. No pancreatic ductal dilatation. No pancreatic or peripancreatic fluid collections or inflammatory changes. Spleen: Unremarkable. Adrenals/Urinary Tract: Bilateral kidneys and bilateral adrenal glands are normal in appearance. No hydroureteronephrosis. Urinary bladder is unremarkable in appearance. Stomach/Bowel: Stomach is moderately distended. There are numerous prominent borderline dilated loops of small bowel in the left side of the abdomen, with several small air-fluid levels. Distal small bowel appears completely decompressed. An exact  transition point is not confidently identified, but likely in the distal jejunum or proximal ileum. Some gas and stool are noted throughout the colon. Colon is nondilated. Normal appendix. Vascular/Lymphatic: Atherosclerosis in the abdominal aorta and pelvic vasculature, without evidence of aneurysm or dissection. No definite lymphadenopathy confidently identified in the abdomen or pelvis. Reproductive: Tiny calcification in the fundus of the uterus likely represents a small calcified fibroid. Uterus and ovaries are otherwise unremarkable in appearance. Other: Tiny umbilical hernia containing a small amount of omental fat. No significant volume of ascites. No pneumoperitoneum. Smoothly marginated low-attenuation lesion in the right inguinal region with adjacent calcification (axial image 77 of series 2),  nonspecific, but relatively benign in appearance, potentially chronic seroma or old hematoma related to prior vascular access. Musculoskeletal: There are no aggressive appearing lytic or blastic lesions noted in the visualized portions of the skeleton. IMPRESSION: 1. Findings are concerning for probable early or partial small bowel obstruction. Transition point not confidently identified, but likely in the distal jejunum or proximal ileum. 2. Tiny umbilical hernia containing only a small amount of omental fat. 3. Small fluid collection in the right inguinal region, favored to represent a benign lesion potentially related to prior vascular access. Correlation with clinical history is suggested. Follow-up nonemergent outpatient ultrasound of this region could be considered to ensure the benign nature of this finding. 4. Cardiomegaly. 5. Aortic atherosclerosis, in addition to at least right coronary artery disease. Please note that although the presence of coronary artery calcium documents the presence of coronary artery disease, the severity of this disease and any potential stenosis cannot be assessed on this  non-gated CT examination. Assessment for potential risk factor modification, dietary therapy or pharmacologic therapy may be warranted, if clinically indicated. Electronically Signed   By: Vinnie Langton M.D.   On: 10/19/2022 05:54             LOS: 2 days        Emeterio Reeve, DO [facility] 10/21/2022, 2:21 PM    Dictation software may have been used to generate the above note. Typos may occur and escape review in typed/dictated notes. Please contact Dr Sheppard Coil directly for clarity if needed.  Staff may message me via secure chat in [place]  but this may not receive an immediate response,  please page me for urgent matters!  If 7PM-7AM, please contact night coverage [email or web address]

## 2022-10-21 NOTE — Progress Notes (Signed)
[facility] SURGICAL PROGRESS NOTE (cpt [phone])  Hospital Day(s): 2.   Post op day(s):  Marland Kitchen   Interval History: Patient seen and examined, no acute events or new complaints overnight. Patient reports flatus, denies bowel movement, nausea or vomiting.  Complains of heartburn.  Last emesis on arrival in ED.  Today she reports not having an appetite.  Denies abdominal pain, cramping.    Review of Systems:  Constitutional: denies fever, chills  HEENT: denies cough or congestion  Respiratory: denies any shortness of breath  Cardiovascular: denies chest pain or palpitations  Gastrointestinal: denies abdominal pain, N/V, or diarrhea/and bowel function as per interval history Genitourinary: denies burning with urination or urinary frequency Musculoskeletal: denies pain, decreased motor or sensation Integumentary: denies any other rashes or skin discolorations Neurological: denies HA or vision/hearing changes   Vital signs in last 24 hours: [min-max] current  Temp:  [98.1 F (36.7 C)-98.7 F (37.1 C)] 98.3 F (36.8 C) (01/14 1200) Pulse Rate:  [65-91] 91 (01/14 1200) Resp:  [16-18] 18 (01/14 1200) BP: (112-188)/(79-125) 112/92 (01/14 1200) SpO2:  [100 %] 100 % (01/14 0732)     Height: 5\' 3"  (160 cm) Weight: 64.4 kg BMI (Calculated): 25.16   Intake/Output last 2 shifts:  No intake/output data recorded.    Physical Exam:  Constitutional: alert, cooperative and no distress  HENT: normocephalic without obvious abnormality  Eyes: PERRL, EOM's grossly intact and symmetric  Neuro: CN II - XII grossly intact and symmetric without deficit  Respiratory: breathing non-labored at rest  Cardiovascular: regular rate and sinus rhythm  Gastrointestinal: soft, non-tender, and non-distended Musculoskeletal: UE and LE FROM, no edema or wounds, motor and sensation grossly intact, NT    Labs:     Latest Ref Rng & Units 10/20/2022    7:50 AM 10/19/2022    3:14 AM 05/07/2022    2:54 PM   CBC  WBC 4.0 - 10.5 K/uL 6.0  5.9  6.2   Hemoglobin 12.0 - 15.0 g/dL 10.6  10.9  12.7   Hematocrit 36.0 - 46.0 % 32.5  33.8  39.2   Platelets 150 - 400 K/uL 417  416  321       Latest Ref Rng & Units 10/20/2022    7:50 AM 10/19/2022    3:14 AM 05/07/2022    2:54 PM  CMP  Glucose 70 - 99 mg/dL 76  119  108   BUN 8 - 23 mg/dL 17  12  19    Creatinine 0.44 - 1.00 mg/dL 0.79  0.77  1.02   Sodium 135 - 145 mmol/L 136  136  141   Potassium 3.5 - 5.1 mmol/L 3.3  3.6  3.8   Chloride 98 - 111 mmol/L 100  99  104   CO2 22 - 32 mmol/L 24  27  28    Calcium 8.9 - 10.3 mg/dL 8.7  8.9  9.4   Total Protein 6.5 - 8.1 g/dL  7.6    Total Bilirubin 0.3 - 1.2 mg/dL  0.6    Alkaline Phos 38 - 126 U/L  62    AST 15 - 41 U/L  22    ALT 0 - 44 U/L  11       Imaging studies: CLINICAL DATA:  62 year old female with possible early or partial small bowel obstruction on CT Abdomen and Pelvis yesterday.   EXAM: DG ABDOMEN ACUTE WITH 1 VIEW CHEST   COMPARISON:  CT Abdomen and Pelvis 10/19/2022 and earlier.  FINDINGS: PA view of the chest at 0642 hours. Stable cardiac size and mediastinal contours. Tortuous thoracic aorta. Lung volumes within normal limits. No pneumothorax, pneumoperitoneum, confluent opacity. Visualized tracheal air column is within normal limits.   Upright and supine views of the abdomen and pelvis at 0653 hours. Unchanged retained oral contrast in the right colon. Nonobstructed bowel-gas pattern, mildly improved from the CT scout view yesterday. No pneumoperitoneum. Stable abdominal and pelvic visceral contours. Stable visualized osseous structures.   IMPRESSION: 1.  Non obstructed bowel gas pattern, no free air. 2.  No acute cardiopulmonary abnormality.     Electronically Signed   By: Genevie Ann M.D.   On: 10/20/2022 07:00   Assessment/Plan:  62 y.o. female with admission for nausea/vomiting with question of partial small bowel obstruction on CT scanning imaging,  complicated by pertinent comorbidities including: Patient Active Problem List   Diagnosis Date Noted   Partial small bowel obstruction ([place]) 10/19/2022   Numbness and tingling of left hand 10/18/2017   Smoker 10/18/2017   Coronary artery calcification 10/18/2017   Chest pain 10/17/2017   Benign essential HTN 10/17/2017     -Appreciate addition of antacids/Tums.  Will see how she tolerates regular diet..  -My suspicion for partial small bowel obstruction remains quite low.  -Will see how she tolerates a diet, prior to considering any additional imaging at this time.  -Will follow with you.    -- Ronny Bacon M.D., [facility] 10/21/2022 12:46 PM

## 2022-10-22 DIAGNOSIS — K56600 Partial intestinal obstruction, unspecified as to cause: Secondary | ICD-10-CM | POA: Diagnosis not present

## 2022-10-22 DIAGNOSIS — K56609 Unspecified intestinal obstruction, unspecified as to partial versus complete obstruction: Secondary | ICD-10-CM | POA: Diagnosis not present

## 2022-10-22 LAB — BASIC METABOLIC PANEL
Anion gap: 7 (ref 5–15)
BUN: 15 mg/dL (ref 8–23)
CO2: 29 mmol/L (ref 22–32)
Calcium: 9.2 mg/dL (ref 8.9–10.3)
Chloride: 95 mmol/L — ABNORMAL LOW (ref 98–111)
Creatinine, Ser: 0.96 mg/dL (ref 0.44–1.00)
GFR, Estimated: >60 mL/min (ref >60)
Glucose, Bld: 116 mg/dL — ABNORMAL HIGH (ref 70–99)
Potassium: 3.9 mmol/L (ref 3.5–5.1)
Sodium: 131 mmol/L — ABNORMAL LOW (ref 135–145)

## 2022-10-22 MED ORDER — POLYETHYLENE GLYCOL 3350 17 G PO PACK: 17.0000 g | PACK | Freq: Every day | ORAL | 0 refills | Status: DC

## 2022-10-22 MED ORDER — ASPIRIN 81 MG PO TBEC: 81.0000 mg | DELAYED_RELEASE_TABLET | Freq: Every day | ORAL | 0 refills | Status: AC

## 2022-10-22 MED ORDER — FAMOTIDINE 20 MG PO TABS: 20.0000 mg | ORAL_TABLET | Freq: Two times a day (BID) | ORAL | 0 refills | Status: DC

## 2022-10-22 MED ORDER — NICOTINE 21 MG/24HR TD PT24: 21.0000 mg | MEDICATED_PATCH | Freq: Every day | TRANSDERMAL | 0 refills | Status: DC

## 2022-10-22 MED ORDER — NAPROXEN 500 MG PO TABS: 500.0000 mg | ORAL_TABLET | Freq: Two times a day (BID) | ORAL | 2 refills | Status: DC | PRN

## 2022-10-22 MED ORDER — CALCIUM CARBONATE ANTACID 500 MG PO CHEW: 1.0000 | CHEWABLE_TABLET | Freq: Three times a day (TID) | ORAL | Status: AC | PRN

## 2022-10-22 NOTE — Discharge Summary (Signed)
Physician Discharge Summary   Patient: Robin Mckinney MRN: [id number]  DOB: 06-07-1961   Admit:     Date of Admission: 10/19/2022 Admitted from: home   Discharge: Date of discharge: 10/22/22 Disposition: Home Condition at discharge: good  CODE STATUS: FULL CODE      Discharge Physician: Sunnie Nielsen, DO [facility]     PCP: [facility]  Recommendations for Outpatient Follow-up:  Follow up with PCP [facility], [facility] in 1-2 weeks Please obtain labs/tests: CBC, BMP in 1-2 weeks Please follow up on the following pending results: none PCP AND OTHER OUTPATIENT PROVIDERS: SEE BELOW FOR SPECIFIC DISCHARGE INSTRUCTIONS PRINTED FOR PATIENT IN ADDITION TO GENERIC AVS PATIENT INFO     Discharge Instructions     Diet - low sodium heart healthy   Complete by: As directed    Increase activity slowly   Complete by: As directed          Discharge Diagnoses: Principal Problem:   Partial small bowel obstruction (HCC) Active Problems:   Benign essential HTN   Coronary artery calcification   Smoker       Hospital Course: 62 y.o. female presented to [facility] ED today for evaluation of emesis. Patient reports around a 10 day history of vomiting. She is report 1-3 episodes daily over this time. This has been non-bilious and non-bloody. She endorses associated upper abdominal pain and flank pain as well, but feels this has been secondary to her vomiting episodes. No associated fever, chills, cough, CP, SOB, or urinary changes. She continues to pass flatus and does endorse "normal" bowel movements, including prior to arrival.   01/12: in ED normal WBC to 5.9K, renal function normal with sCr - 0.77, no electrolyte derangements, lab work was otherwise reassuring. CT Abdomen/Pelvis was obtained and showed loops of dilated small bowel more in upper abdomen, no clear transition zone, decompressed bowel distally with air and stool  in colon. She was ultimately admitted to the hospitalist service for intractable nausea/emesis and possible pSBO. General surgery consulted. To remain NPO except sips/ice, no surgical intervention, no NGT needed, continue IV fluids, follow AM KUB 01/13: K 3.3 replaced, rest of BMP and CBC unremarkable. KUB Non obstructed bowel gas pattern, no free air.  Initiate CLD, advance as tolerated.  Surgery team saw her this morning, doubt SBO, defer further imaging for now 01/14: tolerating diet but limited intake d/t nausea and still has not had BM. Encouraged OOB and ambulating. More nauseous this evening, KUB showed no concerns  01/15: feeling better, bowel function has returned, pt tolerating diet and eager for discharge.   Consultants:  General Surgery   Procedures: none      ASSESSMENT & PLAN:   Principal Problem:   Partial small bowel obstruction (HCC) Active Problems:   Benign essential HTN   Coronary artery calcification   Smoker  Partial small bowel obstruction (HCC): resolved D/c home w/ ilus pt info and bowel regimen as below    Benign essential HTN Home meds resumed   Hypokalemia Monitor BMP  Coronary artery calcification Aspirin dose reduced    Smoker Nicotine patch   Fluid in R inguinal region: CT showed small fluid collection in the right inguinal region, favored to represent a benign lesion potentially related to prior vascular access.  need to f/u with PCP to get outpatient ultrasound of this region to ensure the benign nature of this finding.  Discharge Instructions  Allergies as of 10/22/2022   No Known Allergies      Medication List     STOP taking these medications    aluminum-magnesium hydroxide-simethicone 200-200-20 MG/5ML Susp Commonly known as: MAALOX       TAKE these medications    aspirin EC 81 MG tablet Take 1 tablet (81 mg total) by mouth daily. What changed:  medication strength how much to take    calcium carbonate 500 MG chewable tablet Commonly known as: TUMS - dosed in mg elemental calcium Chew 1-2 tablets (200-400 mg of elemental calcium total) by mouth 3 (three) times daily as needed for indigestion or heartburn.   famotidine 20 MG tablet Commonly known as: PEPCID Take 1 tablet (20 mg total) by mouth 2 (two) times daily.   lisinopril-hydrochlorothiazide 10-12.5 MG tablet Commonly known as: ZESTORETIC Take 1 tablet by mouth daily. What changed: Another medication with the same name was removed. Continue taking this medication, and follow the directions you see here.   naproxen 500 MG tablet Commonly known as: Naprosyn Take 1 tablet (500 mg total) by mouth 2 (two) times daily as needed for mild pain or moderate pain. What changed:  when to take this reasons to take this   nicotine 21 mg/24hr patch Commonly known as: NICODERM CQ - dosed in mg/24 hours Place 1 patch (21 mg total) onto the skin daily.   polyethylene glycol 17 g packet Commonly known as: MIRALAX / GLYCOLAX Take 17 g by mouth daily.          No Known Allergies   Subjective: pt reports abd pain resolved, no nausea/vomiting, has had normal BM   Discharge Exam: BP (!) 145/107 (BP Location: Left Arm)   Pulse 73   Temp 98.3 F (36.8 C) (Oral)   Resp 18   Ht 5\' 3"  (1.6 m)   Wt 64.4 kg   SpO2 100%   BMI 25.15 kg/m  General: Pt is alert, awake, not in acute distress Cardiovascular: RRR, S1/S2 +, no rubs, no gallops Respiratory: CTA bilaterally, no wheezing, no rhonchi Abdominal: Soft, NT, ND, bowel sounds + Extremities: no edema, no cyanosis     The results of significant diagnostics from this hospitalization (including imaging, microbiology, ancillary and laboratory) are listed below for reference.     Microbiology: Recent Results (from the past 240 hour(s))  Resp panel by RT-PCR (RSV, Flu A&B, Covid) Anterior Nasal Swab     Status: None   Collection Time: 10/19/22  3:14 AM    Specimen: Anterior Nasal Swab  Result Value Ref Range Status   SARS Coronavirus 2 by RT PCR NEGATIVE NEGATIVE Final    Comment: (NOTE) SARS-CoV-2 target nucleic acids are NOT DETECTED.  The SARS-CoV-2 RNA is generally detectable in upper respiratory specimens during the acute phase of infection. The lowest concentration of SARS-CoV-2 viral copies this assay can detect is 138 copies/mL. A negative result does not preclude SARS-Cov-2 infection and should not be used as the sole basis for treatment or other patient management decisions. A negative result may occur with  improper specimen collection/handling, submission of specimen other than nasopharyngeal swab, presence of viral mutation(s) within the areas targeted by this assay, and inadequate number of viral copies(<138 copies/mL). A negative result must be combined with clinical observations, patient history, and epidemiological information. The expected result is Negative.  Fact Sheet for Patients:  EntrepreneurPulse.com.au  Fact Sheet for Healthcare Providers:  IncredibleEmployment.be  This test is no t yet approved or  cleared by the [place] and  has been authorized for detection and/or diagnosis of SARS-CoV-2 by FDA under an Emergency Use Authorization (EUA). This EUA will remain  in effect (meaning this test can be used) for the duration of the COVID-19 declaration under Section 564(b)(1) of the Act, 21 U.S.C.section 360bbb-3(b)(1), unless the authorization is terminated  or revoked sooner.       Influenza A by PCR NEGATIVE NEGATIVE Final   Influenza B by PCR NEGATIVE NEGATIVE Final    Comment: (NOTE) The Xpert Xpress SARS-CoV-2/FLU/RSV plus assay is intended as an aid in the diagnosis of influenza from Nasopharyngeal swab specimens and should not be used as a sole basis for treatment. Nasal washings and aspirates are unacceptable for Xpert Xpress  SARS-CoV-2/FLU/RSV testing.  Fact Sheet for Patients: BloggerCourse.com  Fact Sheet for Healthcare Providers: SeriousBroker.it  This test is not yet approved or cleared by the [place] FDA and has been authorized for detection and/or diagnosis of SARS-CoV-2 by FDA under an Emergency Use Authorization (EUA). This EUA will remain in effect (meaning this test can be used) for the duration of the COVID-19 declaration under Section 564(b)(1) of the Act, 21 U.S.C. section 360bbb-3(b)(1), unless the authorization is terminated or revoked.     Resp Syncytial Virus by PCR NEGATIVE NEGATIVE Final    Comment: (NOTE) Fact Sheet for Patients: BloggerCourse.com  Fact Sheet for Healthcare Providers: SeriousBroker.it  This test is not yet approved or cleared by the [place] FDA and has been authorized for detection and/or diagnosis of SARS-CoV-2 by FDA under an Emergency Use Authorization (EUA). This EUA will remain in effect (meaning this test can be used) for the duration of the COVID-19 declaration under Section 564(b)(1) of the Act, 21 U.S.C. section 360bbb-3(b)(1), unless the authorization is terminated or revoked.  Performed at [facility], [address] [place], [place] [address]      Labs: BNP (last 3 results) No results for input(s): "BNP" in the last 8760 hours. Basic Metabolic Panel: Recent Labs  Lab 10/19/22 0314 10/20/22 0750 10/22/22 0422  NA 136 136 131*  K 3.6 3.3* 3.9  CL 99 100 95*  CO2 27 24 29   GLUCOSE 119* 76 116*  BUN 12 17 15   CREATININE 0.77 0.79 0.96  CALCIUM 8.9 8.7* 9.2   Liver Function Tests: Recent Labs  Lab 10/19/22 0314  AST 22  ALT 11  ALKPHOS 62  BILITOT 0.6  PROT 7.6  ALBUMIN 3.9   Recent Labs  Lab 10/19/22 0314  LIPASE 46   No results for input(s): "AMMONIA" in the last 168 hours. CBC: Recent Labs  Lab  10/19/22 0314 10/20/22 0750  WBC 5.9 6.0  NEUTROABS 4.4  --   HGB 10.9* 10.6*  HCT 33.8* 32.5*  MCV 88.7 88.6  PLT 416* 417*   Cardiac Enzymes: No results for input(s): "CKTOTAL", "CKMB", "CKMBINDEX", "TROPONINI" in the last 168 hours. BNP: Invalid input(s): "POCBNP" CBG: No results for input(s): "GLUCAP" in the last 168 hours. D-Dimer No results for input(s): "DDIMER" in the last 72 hours. Hgb A1c No results for input(s): "HGBA1C" in the last 72 hours. Lipid Profile No results for input(s): "CHOL", "HDL", "LDLCALC", "TRIG", "CHOLHDL", "LDLDIRECT" in the last 72 hours. Thyroid function studies No results for input(s): "TSH", "T4TOTAL", "T3FREE", "THYROIDAB" in the last 72 hours.  Invalid input(s): "FREET3" Anemia work up No results for input(s): "VITAMINB12", "FOLATE", "FERRITIN", "TIBC", "IRON", "RETICCTPCT" in the last 72 hours. Urinalysis    Component  Value Date/Time   COLORURINE YELLOW (A) 10/19/2022 0513   APPEARANCEUR HAZY (A) 10/19/2022 0513   LABSPEC 1.014 10/19/2022 0513   PHURINE 8.0 10/19/2022 0513   GLUCOSEU 50 (A) 10/19/2022 0513   HGBUR NEGATIVE 10/19/2022 0513   BILIRUBINUR NEGATIVE 10/19/2022 0513   KETONESUR 5 (A) 10/19/2022 0513   PROTEINUR 30 (A) 10/19/2022 0513   UROBILINOGEN 0.2 09/05/2009 1200   NITRITE NEGATIVE 10/19/2022 0513   LEUKOCYTESUR NEGATIVE 10/19/2022 0513   Sepsis Labs Recent Labs  Lab 10/19/22 0314 10/20/22 0750  WBC 5.9 6.0   Microbiology Recent Results (from the past 240 hour(s))  Resp panel by RT-PCR (RSV, Flu A&B, Covid) Anterior Nasal Swab     Status: None   Collection Time: 10/19/22  3:14 AM   Specimen: Anterior Nasal Swab  Result Value Ref Range Status   SARS Coronavirus 2 by RT PCR NEGATIVE NEGATIVE Final    Comment: (NOTE) SARS-CoV-2 target nucleic acids are NOT DETECTED.  The SARS-CoV-2 RNA is generally detectable in upper respiratory specimens during the acute phase of infection. The lowest concentration of  SARS-CoV-2 viral copies this assay can detect is 138 copies/mL. A negative result does not preclude SARS-Cov-2 infection and should not be used as the sole basis for treatment or other patient management decisions. A negative result may occur with  improper specimen collection/handling, submission of specimen other than nasopharyngeal swab, presence of viral mutation(s) within the areas targeted by this assay, and inadequate number of viral copies(<138 copies/mL). A negative result must be combined with clinical observations, patient history, and epidemiological information. The expected result is Negative.  Fact Sheet for Patients:  BloggerCourse.com  Fact Sheet for Healthcare Providers:  SeriousBroker.it  This test is no t yet approved or cleared by the [place] FDA and  has been authorized for detection and/or diagnosis of SARS-CoV-2 by FDA under an Emergency Use Authorization (EUA). This EUA will remain  in effect (meaning this test can be used) for the duration of the COVID-19 declaration under Section 564(b)(1) of the Act, 21 U.S.C.section 360bbb-3(b)(1), unless the authorization is terminated  or revoked sooner.       Influenza A by PCR NEGATIVE NEGATIVE Final   Influenza B by PCR NEGATIVE NEGATIVE Final    Comment: (NOTE) The Xpert Xpress SARS-CoV-2/FLU/RSV plus assay is intended as an aid in the diagnosis of influenza from Nasopharyngeal swab specimens and should not be used as a sole basis for treatment. Nasal washings and aspirates are unacceptable for Xpert Xpress SARS-CoV-2/FLU/RSV testing.  Fact Sheet for Patients: BloggerCourse.com  Fact Sheet for Healthcare Providers: SeriousBroker.it  This test is not yet approved or cleared by the [place] FDA and has been authorized for detection and/or diagnosis of SARS-CoV-2 by FDA under an Emergency Use  Authorization (EUA). This EUA will remain in effect (meaning this test can be used) for the duration of the COVID-19 declaration under Section 564(b)(1) of the Act, 21 U.S.C. section 360bbb-3(b)(1), unless the authorization is terminated or revoked.     Resp Syncytial Virus by PCR NEGATIVE NEGATIVE Final    Comment: (NOTE) Fact Sheet for Patients: BloggerCourse.com  Fact Sheet for Healthcare Providers: SeriousBroker.it  This test is not yet approved or cleared by the [place] FDA and has been authorized for detection and/or diagnosis of SARS-CoV-2 by FDA under an Emergency Use Authorization (EUA). This EUA will remain in effect (meaning this test can be used) for the duration of the COVID-19 declaration under Section 564(b)(1) of the Act,  21 U.S.C. section 360bbb-3(b)(1), unless the authorization is terminated or revoked.  Performed at [facility], [place], [place] [address]    Imaging DG ABD ACUTE 2+V W 1V CHEST  Result Date: 10/20/2022 CLINICAL DATA:  62 year old female with possible early or partial small bowel obstruction on CT Abdomen and Pelvis yesterday. EXAM: DG ABDOMEN ACUTE WITH 1 VIEW CHEST COMPARISON:  CT Abdomen and Pelvis 10/19/2022 and earlier. FINDINGS: PA view of the chest at 0642 hours. Stable cardiac size and mediastinal contours. Tortuous thoracic aorta. Lung volumes within normal limits. No pneumothorax, pneumoperitoneum, confluent opacity. Visualized tracheal air column is within normal limits. Upright and supine views of the abdomen and pelvis at 0653 hours. Unchanged retained oral contrast in the right colon. Nonobstructed bowel-gas pattern, mildly improved from the CT scout view yesterday. No pneumoperitoneum. Stable abdominal and pelvic visceral contours. Stable visualized osseous structures. IMPRESSION: 1.  Non obstructed bowel gas pattern, no free air. 2.  No acute  cardiopulmonary abnormality. Electronically Signed   By: Genevie Ann M.D.   On: 10/20/2022 07:00   CT ABDOMEN PELVIS W CONTRAST  Result Date: 10/19/2022 CLINICAL DATA:  62 year old female with history of recurrent emesis and upper abdominal pain. EXAM: CT ABDOMEN AND PELVIS WITH CONTRAST TECHNIQUE: Multidetector CT imaging of the abdomen and pelvis was performed using the standard protocol following bolus administration of intravenous contrast. RADIATION DOSE REDUCTION: This exam was performed according to the departmental dose-optimization program which includes automated exposure control, adjustment of the mA and/or kV according to patient size and/or use of iterative reconstruction technique. CONTRAST:  168mL OMNIPAQUE IOHEXOL 300 MG/ML  SOLN COMPARISON:  No priors. FINDINGS: Lower chest: Cardiomegaly. Atherosclerotic calcification in the right coronary artery. Hepatobiliary: No definite suspicious cystic or solid hepatic lesions. No intra or extrahepatic biliary ductal dilatation. Gallbladder is unremarkable in appearance. Pancreas: No pancreatic mass. No pancreatic ductal dilatation. No pancreatic or peripancreatic fluid collections or inflammatory changes. Spleen: Unremarkable. Adrenals/Urinary Tract: Bilateral kidneys and bilateral adrenal glands are normal in appearance. No hydroureteronephrosis. Urinary bladder is unremarkable in appearance. Stomach/Bowel: Stomach is moderately distended. There are numerous prominent borderline dilated loops of small bowel in the left side of the abdomen, with several small air-fluid levels. Distal small bowel appears completely decompressed. An exact transition point is not confidently identified, but likely in the distal jejunum or proximal ileum. Some gas and stool are noted throughout the colon. Colon is nondilated. Normal appendix. Vascular/Lymphatic: Atherosclerosis in the abdominal aorta and pelvic vasculature, without evidence of aneurysm or dissection. No definite  lymphadenopathy confidently identified in the abdomen or pelvis. Reproductive: Tiny calcification in the fundus of the uterus likely represents a small calcified fibroid. Uterus and ovaries are otherwise unremarkable in appearance. Other: Tiny umbilical hernia containing a small amount of omental fat. No significant volume of ascites. No pneumoperitoneum. Smoothly marginated low-attenuation lesion in the right inguinal region with adjacent calcification (axial image 77 of series 2), nonspecific, but relatively benign in appearance, potentially chronic seroma or old hematoma related to prior vascular access. Musculoskeletal: There are no aggressive appearing lytic or blastic lesions noted in the visualized portions of the skeleton. IMPRESSION: 1. Findings are concerning for probable early or partial small bowel obstruction. Transition point not confidently identified, but likely in the distal jejunum or proximal ileum. 2. Tiny umbilical hernia containing only a small amount of omental fat. 3. Small fluid collection in the right inguinal region, favored to represent a benign lesion potentially related to prior vascular  access. Correlation with clinical history is suggested. Follow-up nonemergent outpatient ultrasound of this region could be considered to ensure the benign nature of this finding. 4. Cardiomegaly. 5. Aortic atherosclerosis, in addition to at least right coronary artery disease. Please note that although the presence of coronary artery calcium documents the presence of coronary artery disease, the severity of this disease and any potential stenosis cannot be assessed on this non-gated CT examination. Assessment for potential risk factor modification, dietary therapy or pharmacologic therapy may be warranted, if clinically indicated. Electronically Signed   By: Trudie Reedaniel  Entrikin M.D.   On: 10/19/2022 05:54      Time coordinating discharge: over 30 minutes  SIGNED:  Sunnie NielsenNatalie Zamorah Ailes DO [facility]

## 2022-10-22 NOTE — TOC Initial Note (Signed)
Transition of Care [facility]) - Initial/Assessment Note    Patient Details  Name: KAYLA DESHAIES MRN: [id number] Date of Birth: [birth year]  Transition of Care [facility]) CM/SW Contact:    Beverly Sessions, RN Phone Number: 10/22/2022, 10:56 AM  Clinical Narrative:                  Transition of Care [facility]) Screening Note   Patient Details  Name: CAITLINN KLINKER Date of Birth: 07-10-1961   Transition of Care [facility]) CM/SW Contact:    Beverly Sessions, RN Phone Number: 10/22/2022, 10:56 AM    Transition of Care Department [facility]) has reviewed patient and no TOC needs have been identified at this time. We will continue to monitor patient advancement through interdisciplinary progression rounds. If new patient transition needs arise, please place a TOC consult.          Patient Goals and CMS Choice            Expected Discharge Plan and Services         Expected Discharge Date: 10/22/22                                    Prior Living Arrangements/Services                       Activities of Daily Living Home Assistive Devices/Equipment: None ADL Screening (condition at time of admission) Patient's cognitive ability adequate to safely complete daily activities?: Yes Is the patient deaf or have difficulty hearing?: No Does the patient have difficulty seeing, even when wearing glasses/contacts?: No Does the patient have difficulty concentrating, remembering, or making decisions?: No Patient able to express need for assistance with ADLs?: Yes Does the patient have difficulty dressing or bathing?: No Independently performs ADLs?: Yes (appropriate for developmental age) Does the patient have difficulty walking or climbing stairs?: No Weakness of Legs: None Weakness of Arms/Hands: None  Permission Sought/Granted                  Emotional Assessment              Admission diagnosis:  SBO (small bowel obstruction) ([place]) [K56.609] Partial  small bowel obstruction ([place]) [K56.600] Patient Active Problem List   Diagnosis Date Noted   Partial small bowel obstruction ([place]) 10/19/2022   Numbness and tingling of left hand 10/18/2017   Smoker 10/18/2017   Coronary artery calcification 10/18/2017   Chest pain 10/17/2017   Benign essential HTN 10/17/2017   PCP:  [facility], [place]:   [id number] [address], [place] [address] Phone: [phone] Fax: [place], [place] [address] Phone: [phone] Fax: [phone]     Social Determinants of Health (SDOH) Social History: SDOH Screenings   Food Insecurity: No Food Insecurity (10/19/2022)  Housing: Low Risk  (10/19/2022)  Transportation Needs: No Transportation Needs (10/19/2022)  Utilities: Not At Risk (10/19/2022)  Tobacco Use: High Risk (10/19/2022)   SDOH Interventions:     Readmission Risk Interventions     No data to display

## 2022-10-22 NOTE — Progress Notes (Signed)
[facility] SURGICAL PROGRESS NOTE (cpt [phone])  Hospital Day(s): 3.   Interval History: Patient seen and examined, no acute events or new complaints overnight. Patient reports she is feeling "much better." She denies fever, chills, nausea, emesis. BMP is reassuring this AM. She is advanced to regular diet; tolerating without issues. She continues to have bowel function.   Review of Systems:  Constitutional: denies fever, chills  HEENT: denies cough or congestion  Respiratory: denies any shortness of breath  Cardiovascular: denies chest pain or palpitations  Gastrointestinal: denies abdominal pain, N/V Genitourinary: denies burning with urination or urinary frequency Musculoskeletal: denies pain, decreased motor or sensation  Vital signs in last 24 hours: [min-max] current  Temp:  [98.2 F (36.8 C)-98.6 F (37 C)] 98.6 F (37 C) (01/15 0510) Pulse Rate:  [76-91] 85 (01/15 0510) Resp:  [16-18] 17 (01/15 0510) BP: (102-169)/(77-116) 102/77 (01/15 0510) SpO2:  [99 %-100 %] 100 % (01/15 0510)     Height: 5\' 3"  (160 cm) Weight: 64.4 kg BMI (Calculated): 25.16   Intake/Output last 2 shifts:  01/14 0701 - 01/15 0700 In: 480 [P.O.:480] Out: 1 [Urine:1]   Physical Exam:  Constitutional: alert, cooperative and no distress  HENT: normocephalic without obvious abnormality  Eyes: PERRL, EOM's grossly intact and symmetric  Respiratory: breathing non-labored at rest  Cardiovascular: regular rate and sinus rhythm  Gastrointestinal: soft, non-tender, and non-distended, no rebound/guarding Musculoskeletal: no edema or wounds, motor and sensation grossly intact, NT    Labs:     Latest Ref Rng & Units 10/20/2022    7:50 AM 10/19/2022    3:14 AM 05/07/2022    2:54 PM  CBC  WBC 4.0 - 10.5 K/uL 6.0  5.9  6.2   Hemoglobin 12.0 - 15.0 g/dL 10.6  10.9  12.7   Hematocrit 36.0 - 46.0 % 32.5  33.8  39.2   Platelets 150 - 400 K/uL 417  416  321       Latest Ref Rng & Units  10/22/2022    4:22 AM 10/20/2022    7:50 AM 10/19/2022    3:14 AM  CMP  Glucose 70 - 99 mg/dL 116  76  119   BUN 8 - 23 mg/dL 15  17  12    Creatinine 0.44 - 1.00 mg/dL 0.96  0.79  0.77   Sodium 135 - 145 mmol/L 131  136  136   Potassium 3.5 - 5.1 mmol/L 3.9  3.3  3.6   Chloride 98 - 111 mmol/L 95  100  99   CO2 22 - 32 mmol/L 29  24  27    Calcium 8.9 - 10.3 mg/dL 9.2  8.7  8.9   Total Protein 6.5 - 8.1 g/dL   7.6   Total Bilirubin 0.3 - 1.2 mg/dL   0.6   Alkaline Phos 38 - 126 U/L   62   AST 15 - 41 U/L   22   ALT 0 - 44 U/L   11      Imaging studies: No new pertinent imaging studies   Assessment/Plan: (ICD-10's: K54.609) 62 y.o. female with now resolved nausea/emesis, now improving, admitted with possible pSBO although favor enteritis -vs- hyperemesis from cannabis abuse as she is having no abdominal pain, no clear transitions on imaging, and she continues to have bowel function   - Okay to continue diet as tolerated - Nothing further to add from surgical perspective  - Monitor abdominal examination; on-going bowel function              -  Pain control prn; Antiemetics prn             - Mobilize as tolerated             - Further management per primary service    - Discharge Planning: Nothing to add from surgical perspective; symptoms resolved. Patient okay for discharge from our perspective; she does NOT need surgical follow up   All of the above findings and recommendations were discussed with the patient, and the medical team, and all of patient's questions were answered to their expressed satisfaction.  -- Edison Simon, PA-C [facility] 10/22/2022, 7:18 AM M-F: 7am - 4pm

## 2022-10-24 ENCOUNTER — Inpatient Hospital Stay
Admission: EM | Admit: 2022-10-24 | Discharge: 2022-10-31 | DRG: 326 | Disposition: A | Discharge status: Home / Self Care | Payer: Medicaid Other | Attending: Surgery | Admitting: Surgery

## 2022-10-24 ENCOUNTER — Other Ambulatory Visit: Payer: Self-pay

## 2022-10-24 ENCOUNTER — Emergency Department: Payer: Medicaid Other

## 2022-10-24 ENCOUNTER — Encounter
Admission: EM | Disposition: A | Discharge status: Home / Self Care | Payer: Self-pay | Source: Home / Self Care | Attending: Surgery

## 2022-10-24 ENCOUNTER — Encounter: Payer: Self-pay | Admitting: Radiology

## 2022-10-24 ENCOUNTER — Inpatient Hospital Stay: Payer: Medicaid Other | Admitting: Anesthesiology

## 2022-10-24 DIAGNOSIS — D649 Anemia, unspecified: Secondary | ICD-10-CM | POA: Diagnosis present

## 2022-10-24 DIAGNOSIS — K251 Acute gastric ulcer with perforation: Secondary | ICD-10-CM | POA: Diagnosis not present

## 2022-10-24 DIAGNOSIS — K659 Peritonitis, unspecified: Secondary | ICD-10-CM | POA: Diagnosis present

## 2022-10-24 DIAGNOSIS — K219 Gastro-esophageal reflux disease without esophagitis: Secondary | ICD-10-CM | POA: Diagnosis present

## 2022-10-24 DIAGNOSIS — Z79899 Other long term (current) drug therapy: Secondary | ICD-10-CM

## 2022-10-24 DIAGNOSIS — I10 Essential (primary) hypertension: Secondary | ICD-10-CM | POA: Diagnosis present

## 2022-10-24 DIAGNOSIS — K255 Chronic or unspecified gastric ulcer with perforation: Principal | ICD-10-CM | POA: Diagnosis present

## 2022-10-24 DIAGNOSIS — Z8249 Family history of ischemic heart disease and other diseases of the circulatory system: Secondary | ICD-10-CM | POA: Diagnosis not present

## 2022-10-24 DIAGNOSIS — Z7982 Long term (current) use of aspirin: Secondary | ICD-10-CM | POA: Diagnosis not present

## 2022-10-24 DIAGNOSIS — N179 Acute kidney failure, unspecified: Secondary | ICD-10-CM | POA: Diagnosis present

## 2022-10-24 DIAGNOSIS — E876 Hypokalemia: Secondary | ICD-10-CM | POA: Diagnosis present

## 2022-10-24 DIAGNOSIS — F17210 Nicotine dependence, cigarettes, uncomplicated: Secondary | ICD-10-CM | POA: Diagnosis present

## 2022-10-24 DIAGNOSIS — R1010 Upper abdominal pain, unspecified: Principal | ICD-10-CM

## 2022-10-24 DIAGNOSIS — I2510 Atherosclerotic heart disease of native coronary artery without angina pectoris: Secondary | ICD-10-CM | POA: Diagnosis present

## 2022-10-24 HISTORY — DX: Acute gastric ulcer with perforation: K25.1

## 2022-10-24 LAB — COMPREHENSIVE METABOLIC PANEL
ALT: 11 U/L (ref 0–44)
AST: 22 U/L (ref 15–41)
Albumin: 3.8 g/dL (ref 3.5–5.0)
Alkaline Phosphatase: 57 U/L (ref 38–126)
Anion gap: 12 (ref 5–15)
BUN: 35 mg/dL — ABNORMAL HIGH (ref 8–23)
CO2: 25 mmol/L (ref 22–32)
Calcium: 9.2 mg/dL (ref 8.9–10.3)
Chloride: 94 mmol/L — ABNORMAL LOW (ref 98–111)
Creatinine, Ser: 1.54 mg/dL — ABNORMAL HIGH (ref 0.44–1.00)
GFR, Estimated: 38 mL/min — ABNORMAL LOW (ref >60)
Glucose, Bld: 125 mg/dL — ABNORMAL HIGH (ref 70–99)
Potassium: 3.4 mmol/L — ABNORMAL LOW (ref 3.5–5.1)
Sodium: 131 mmol/L — ABNORMAL LOW (ref 135–145)
Total Bilirubin: 0.7 mg/dL (ref 0.3–1.2)
Total Protein: 7 g/dL (ref 6.5–8.1)

## 2022-10-24 LAB — TROPONIN I (HIGH SENSITIVITY)
Troponin I (High Sensitivity): 8 ng/L (ref <18)
Troponin I (High Sensitivity): 10 ng/L (ref <18)

## 2022-10-24 LAB — CBC WITH DIFFERENTIAL/PLATELET
Abs Immature Granulocytes: 0.03 10*3/uL (ref 0.00–0.07)
Basophils Absolute: 0 10*3/uL (ref 0.0–0.1)
Basophils Relative: 0 %
Eosinophils Absolute: 0 10*3/uL (ref 0.0–0.5)
Eosinophils Relative: 0 %
HCT: 38.2 % (ref 36.0–46.0)
Hemoglobin: 12.3 g/dL (ref 12.0–15.0)
Immature Granulocytes: 0 %
Lymphocytes Relative: 10 %
Lymphs Abs: 1 10*3/uL (ref 0.7–4.0)
MCH: 28.9 pg (ref 26.0–34.0)
MCHC: 32.2 g/dL (ref 30.0–36.0)
MCV: 89.7 fL (ref 80.0–100.0)
Monocytes Absolute: 0.3 10*3/uL (ref 0.1–1.0)
Monocytes Relative: 3 %
Neutro Abs: 8 10*3/uL — ABNORMAL HIGH (ref 1.7–7.7)
Neutrophils Relative %: 87 %
Platelets: 455 10*3/uL — ABNORMAL HIGH (ref 150–400)
RBC: 4.26 MIL/uL (ref 3.87–5.11)
RDW: 14.5 % (ref 11.5–15.5)
WBC: 9.3 10*3/uL (ref 4.0–10.5)
nRBC: 0 % (ref 0.0–0.2)

## 2022-10-24 LAB — ETHANOL: Alcohol, Ethyl (B): <10 mg/dL (ref <10)

## 2022-10-24 LAB — GLUCOSE, CAPILLARY: Glucose-Capillary: 85 mg/dL (ref 70–99)

## 2022-10-24 LAB — LIPASE, BLOOD: Lipase: 37 U/L (ref 11–51)

## 2022-10-24 SURGERY — INSERTION, GASTROSTOMY TUBE, ROBOT-ASSISTED
Anesthesia: General | Site: Abdomen

## 2022-10-24 MED ORDER — PROMETHAZINE HCL 25 MG/ML IJ SOLN: 6.2500 mg | INTRAMUSCULAR | Status: DC | PRN

## 2022-10-24 MED ORDER — DEXAMETHASONE SODIUM PHOSPHATE 10 MG/ML IJ SOLN
INTRAMUSCULAR | Status: DC | PRN
  Administered 2022-10-24: 10 mg via INTRAVENOUS

## 2022-10-24 MED ORDER — SODIUM CHLORIDE 0.9 % IV SOLN
1.0000 g | INTRAVENOUS | Status: DC
  Administered 2022-10-24 – 2022-10-30 (×7): 1 g via INTRAVENOUS
  Filled 2022-10-24: qty 10
  Filled 2022-10-24: qty 1
  Filled 2022-10-24 (×4): qty 10
  Filled 2022-10-24 (×2): qty 1
  Filled 2022-10-24 (×2): qty 10

## 2022-10-24 MED ORDER — ACETAMINOPHEN 10 MG/ML IV SOLN
INTRAVENOUS | Status: AC
  Filled 2022-10-24: qty 100

## 2022-10-24 MED ORDER — PROPOFOL 10 MG/ML IV BOLUS
INTRAVENOUS | Status: DC | PRN
  Administered 2022-10-24: 80 mg via INTRAVENOUS

## 2022-10-24 MED ORDER — HYDROMORPHONE HCL 1 MG/ML IJ SOLN: 1.0000 mg | Freq: Once | INTRAMUSCULAR | Status: DC

## 2022-10-24 MED ORDER — DROPERIDOL 2.5 MG/ML IJ SOLN
2.5000 mg | Freq: Once | INTRAMUSCULAR | Status: AC
  Administered 2022-10-24: 2.5 mg via INTRAVENOUS
  Filled 2022-10-24: qty 2

## 2022-10-24 MED ORDER — ONDANSETRON HCL 4 MG/2ML IJ SOLN: 4.0000 mg | Freq: Four times a day (QID) | INTRAMUSCULAR | Status: DC | PRN

## 2022-10-24 MED ORDER — SUGAMMADEX SODIUM 200 MG/2ML IV SOLN
INTRAVENOUS | Status: DC | PRN
  Administered 2022-10-24: 150 mg via INTRAVENOUS

## 2022-10-24 MED ORDER — VASOPRESSIN 20 UNIT/ML IV SOLN
INTRAVENOUS | Status: DC | PRN
  Administered 2022-10-24 (×3): 2 [IU] via INTRAVENOUS

## 2022-10-24 MED ORDER — LACTATED RINGERS IV SOLN: INTRAVENOUS | Status: DC | PRN

## 2022-10-24 MED ORDER — PANTOPRAZOLE SODIUM 40 MG IV SOLR
40.0000 mg | Freq: Two times a day (BID) | INTRAVENOUS | Status: DC
  Administered 2022-10-24 – 2022-10-31 (×14): 40 mg via INTRAVENOUS
  Filled 2022-10-24 (×13): qty 10

## 2022-10-24 MED ORDER — EPINEPHRINE PF 1 MG/ML IJ SOLN
INTRAMUSCULAR | Status: AC
  Filled 2022-10-24: qty 1

## 2022-10-24 MED ORDER — ACETAMINOPHEN 10 MG/ML IV SOLN: 1000.0000 mg | Freq: Once | INTRAVENOUS | Status: DC | PRN

## 2022-10-24 MED ORDER — 0.9 % SODIUM CHLORIDE (POUR BTL) OPTIME
TOPICAL | Status: DC | PRN
  Administered 2022-10-24: 500 mL

## 2022-10-24 MED ORDER — LIDOCAINE HCL (CARDIAC) PF 100 MG/5ML IV SOSY
PREFILLED_SYRINGE | INTRAVENOUS | Status: DC | PRN
  Administered 2022-10-24: 60 mg via INTRAVENOUS

## 2022-10-24 MED ORDER — PROPOFOL 10 MG/ML IV BOLUS
INTRAVENOUS | Status: AC
  Filled 2022-10-24: qty 20

## 2022-10-24 MED ORDER — SUCCINYLCHOLINE CHLORIDE 200 MG/10ML IV SOSY
PREFILLED_SYRINGE | INTRAVENOUS | Status: DC | PRN
  Administered 2022-10-24: 80 mg via INTRAVENOUS

## 2022-10-24 MED ORDER — SODIUM CHLORIDE 0.9 % IV SOLN: INTRAVENOUS | Status: DC

## 2022-10-24 MED ORDER — MORPHINE SULFATE (PF) 4 MG/ML IV SOLN
4.0000 mg | Freq: Once | INTRAVENOUS | Status: AC
  Administered 2022-10-24: 4 mg via INTRAVENOUS
  Filled 2022-10-24: qty 1

## 2022-10-24 MED ORDER — MIDAZOLAM HCL 2 MG/2ML IJ SOLN
INTRAMUSCULAR | Status: AC
  Filled 2022-10-24: qty 2

## 2022-10-24 MED ORDER — METRONIDAZOLE 500 MG/100ML IV SOLN
500.0000 mg | Freq: Three times a day (TID) | INTRAVENOUS | Status: DC
  Administered 2022-10-24 – 2022-10-31 (×21): 500 mg via INTRAVENOUS
  Filled 2022-10-24 (×22): qty 100

## 2022-10-24 MED ORDER — ROCURONIUM BROMIDE 100 MG/10ML IV SOLN
INTRAVENOUS | Status: DC | PRN
  Administered 2022-10-24: 20 mg via INTRAVENOUS
  Administered 2022-10-24: 30 mg via INTRAVENOUS

## 2022-10-24 MED ORDER — ONDANSETRON HCL 4 MG/2ML IJ SOLN
INTRAMUSCULAR | Status: DC | PRN
  Administered 2022-10-24: 4 mg via INTRAVENOUS

## 2022-10-24 MED ORDER — LIDOCAINE HCL (PF) 2 % IJ SOLN
INTRAMUSCULAR | Status: AC
  Filled 2022-10-24: qty 5

## 2022-10-24 MED ORDER — HYDROMORPHONE HCL 1 MG/ML IJ SOLN
1.0000 mg | Freq: Once | INTRAMUSCULAR | Status: AC
  Administered 2022-10-24: 1 mg via INTRAVENOUS
  Filled 2022-10-24: qty 1

## 2022-10-24 MED ORDER — ONDANSETRON HCL 4 MG/2ML IJ SOLN
INTRAMUSCULAR | Status: AC
  Filled 2022-10-24: qty 2

## 2022-10-24 MED ORDER — BUPIVACAINE HCL (PF) 0.25 % IJ SOLN
INTRAMUSCULAR | Status: AC
  Filled 2022-10-24: qty 30

## 2022-10-24 MED ORDER — PANTOPRAZOLE SODIUM 40 MG IV SOLR
40.0000 mg | Freq: Once | INTRAVENOUS | Status: AC
  Administered 2022-10-24: 40 mg via INTRAVENOUS
  Filled 2022-10-24: qty 10

## 2022-10-24 MED ORDER — SODIUM CHLORIDE 0.9 % IV BOLUS: 500.0000 mL | Freq: Once | INTRAVENOUS | Status: DC

## 2022-10-24 MED ORDER — DROPERIDOL 2.5 MG/ML IJ SOLN: 0.6250 mg | Freq: Once | INTRAMUSCULAR | Status: DC | PRN

## 2022-10-24 MED ORDER — HYDROMORPHONE HCL 1 MG/ML IJ SOLN: 0.2500 mg | INTRAMUSCULAR | Status: DC | PRN

## 2022-10-24 MED ORDER — HYDROMORPHONE HCL 1 MG/ML IJ SOLN
0.5000 mg | INTRAMUSCULAR | Status: DC | PRN
  Administered 2022-10-29: 1 mg via INTRAVENOUS
  Filled 2022-10-24: qty 1

## 2022-10-24 MED ORDER — ACETAMINOPHEN 10 MG/ML IV SOLN
INTRAVENOUS | Status: DC | PRN
  Administered 2022-10-24: 1000 mg via INTRAVENOUS

## 2022-10-24 MED ORDER — SODIUM CHLORIDE 0.9 % IV SOLN
1.0000 g | Freq: Once | INTRAVENOUS | Status: AC
  Administered 2022-10-24: 1 g via INTRAVENOUS
  Filled 2022-10-24: qty 10

## 2022-10-24 MED ORDER — DEXAMETHASONE SODIUM PHOSPHATE 10 MG/ML IJ SOLN
INTRAMUSCULAR | Status: AC
  Filled 2022-10-24: qty 1

## 2022-10-24 MED ORDER — METRONIDAZOLE 500 MG/100ML IV SOLN
500.0000 mg | Freq: Once | INTRAVENOUS | Status: AC
  Administered 2022-10-24: 500 mg via INTRAVENOUS
  Filled 2022-10-24: qty 100

## 2022-10-24 MED ORDER — SEVOFLURANE IN SOLN
RESPIRATORY_TRACT | Status: AC
  Filled 2022-10-24: qty 250

## 2022-10-24 MED ORDER — FENTANYL CITRATE (PF) 100 MCG/2ML IJ SOLN
INTRAMUSCULAR | Status: DC | PRN
  Administered 2022-10-24: 50 ug via INTRAVENOUS
  Administered 2022-10-24: 25 ug via INTRAVENOUS

## 2022-10-24 MED ORDER — ONDANSETRON 4 MG PO TBDP: 4.0000 mg | ORAL_TABLET | Freq: Four times a day (QID) | ORAL | Status: DC | PRN

## 2022-10-24 MED ORDER — FENTANYL CITRATE (PF) 100 MCG/2ML IJ SOLN
INTRAMUSCULAR | Status: AC
  Filled 2022-10-24: qty 2

## 2022-10-24 MED ORDER — BUPIVACAINE-EPINEPHRINE (PF) 0.25% -1:200000 IJ SOLN
INTRAMUSCULAR | Status: DC | PRN
  Administered 2022-10-24: 25 mL

## 2022-10-24 MED ORDER — HYDROMORPHONE HCL 1 MG/ML IJ SOLN
1.0000 mg | INTRAMUSCULAR | Status: DC | PRN
  Administered 2022-10-25 – 2022-10-28 (×8): 1 mg via INTRAVENOUS
  Filled 2022-10-24 (×9): qty 1

## 2022-10-24 MED ORDER — SODIUM CHLORIDE 0.9 % IV BOLUS (SEPSIS)
1000.0000 mL | Freq: Once | INTRAVENOUS | Status: AC
  Administered 2022-10-24: 1000 mL via INTRAVENOUS

## 2022-10-24 MED ORDER — SODIUM CHLORIDE 0.9 % IR SOLN
Status: DC | PRN
  Administered 2022-10-24 (×2): 3000 mL

## 2022-10-24 MED ORDER — IOHEXOL 9 MG/ML PO SOLN: 500.0000 mL | ORAL | Status: AC

## 2022-10-24 MED ORDER — PHENYLEPHRINE HCL (PRESSORS) 10 MG/ML IV SOLN
INTRAVENOUS | Status: DC | PRN
  Administered 2022-10-24: 160 ug via INTRAVENOUS
  Administered 2022-10-24 (×5): 80 ug via INTRAVENOUS

## 2022-10-24 MED ORDER — BUPIVACAINE-EPINEPHRINE 0.25% -1:200000 IJ SOLN
INTRAMUSCULAR | Status: DC | PRN
  Administered 2022-10-24: 25 mL via INTRAMUSCULAR

## 2022-10-24 MED ORDER — METRONIDAZOLE 500 MG/100ML IV SOLN
500.0000 mg | Freq: Three times a day (TID) | INTRAVENOUS | Status: DC
  Filled 2022-10-24 (×2): qty 100

## 2022-10-24 MED ORDER — ONDANSETRON HCL 4 MG/2ML IJ SOLN
4.0000 mg | Freq: Once | INTRAMUSCULAR | Status: AC
  Administered 2022-10-24: 4 mg via INTRAVENOUS
  Filled 2022-10-24: qty 2

## 2022-10-24 MED ORDER — ONDANSETRON HCL 4 MG/2ML IJ SOLN: 4.0000 mg | Freq: Once | INTRAMUSCULAR | Status: DC

## 2022-10-24 MED ORDER — METOPROLOL TARTRATE 5 MG/5ML IV SOLN
5.0000 mg | Freq: Four times a day (QID) | INTRAVENOUS | Status: DC | PRN
  Administered 2022-10-27 – 2022-10-28 (×2): 5 mg via INTRAVENOUS
  Filled 2022-10-24 (×2): qty 5

## 2022-10-24 MED ORDER — IOHEXOL 300 MG/ML  SOLN
80.0000 mL | Freq: Once | INTRAMUSCULAR | Status: AC | PRN
  Administered 2022-10-24: 80 mL via INTRAVENOUS

## 2022-10-24 SURGICAL SUPPLY — 86 items
BAG LAPAROSCOPIC 12 15 PORT 16 (BASKET) IMPLANT
BAG PRESSURE INF REUSE 3000 (BAG) ×1 IMPLANT
BAG RETRIEVAL 12/15 (BASKET)
BLADE SURG SZ10 CARB STEEL (BLADE) ×1 IMPLANT
BULB RESERV EVAC DRAIN JP 100C (MISCELLANEOUS) IMPLANT
CANNULA REDUC XI 12-8 STAPL (CANNULA)
CANNULA REDUCER 12-8 DVNC XI (CANNULA) ×1 IMPLANT
COVER TIP SHEARS 8 DVNC (MISCELLANEOUS) ×1 IMPLANT
COVER TIP SHEARS 8MM DA VINCI (MISCELLANEOUS) ×1
DERMABOND ADVANCED .7 DNX12 (GAUZE/BANDAGES/DRESSINGS) ×1 IMPLANT
DRAIN CHANNEL JP 19F (MISCELLANEOUS) IMPLANT
DRAPE ARM DVNC X/XI (DISPOSABLE) ×4 IMPLANT
DRAPE COLUMN DVNC XI (DISPOSABLE) ×1 IMPLANT
DRAPE DA VINCI XI ARM (DISPOSABLE) ×4
DRAPE DA VINCI XI COLUMN (DISPOSABLE) ×1
ELECT REM PT RETURN 9FT ADLT (ELECTROSURGICAL) ×1
ELECTRODE REM PT RTRN 9FT ADLT (ELECTROSURGICAL) ×1 IMPLANT
GELPOINT ADV PLATFORM (ENDOMECHANICALS)
GLOVE ORTHO TXT STRL SZ7.5 (GLOVE) ×4 IMPLANT
GOWN STRL REUS W/ TWL LRG LVL3 (GOWN DISPOSABLE) ×4 IMPLANT
GOWN STRL REUS W/ TWL XL LVL3 (GOWN DISPOSABLE) ×2 IMPLANT
GOWN STRL REUS W/TWL LRG LVL3 (GOWN DISPOSABLE) ×4
GOWN STRL REUS W/TWL XL LVL3 (GOWN DISPOSABLE) ×2
GRASPER SUT TROCAR 14GX15 (MISCELLANEOUS) IMPLANT
HANDLE YANKAUER SUCT BULB TIP (MISCELLANEOUS) ×1 IMPLANT
IRRIGATION STRYKERFLOW (MISCELLANEOUS) IMPLANT
IRRIGATOR STRYKERFLOW (MISCELLANEOUS) ×1
IRRIGATOR SUCT 8 DISP DVNC XI (IRRIGATION / IRRIGATOR) ×1 IMPLANT
IRRIGATOR SUCTION 8MM XI DISP (IRRIGATION / IRRIGATOR)
IV NS IRRIG 3000ML ARTHROMATIC (IV SOLUTION) IMPLANT
KIT IMAGING PINPOINTPAQ (MISCELLANEOUS) ×1 IMPLANT
KIT PINK PAD W/HEAD ARE REST (MISCELLANEOUS) ×1 IMPLANT
KIT PINK PAD W/HEAD ARM REST (MISCELLANEOUS) ×1 IMPLANT
KIT TURNOVER KIT A (KITS) ×1 IMPLANT
MANIFOLD NEPTUNE II (INSTRUMENTS) ×1 IMPLANT
NDL INSUFFLATION 14GA 120MM (NEEDLE) ×1 IMPLANT
NEEDLE HYPO 22GX1.5 SAFETY (NEEDLE) ×1 IMPLANT
NEEDLE INSUFFLATION 14GA 120MM (NEEDLE) ×1 IMPLANT
NS IRRIG 500ML POUR BTL (IV SOLUTION) ×1 IMPLANT
PACK COLON CLEAN CLOSURE (MISCELLANEOUS) ×1 IMPLANT
PACK LAP CHOLECYSTECTOMY (MISCELLANEOUS) ×1 IMPLANT
PLATFORM STD W/COL CELL SVR (ENDOMECHANICALS) ×1 IMPLANT
RELOAD STAPLE 45 3.5 BLU DVNC (STAPLE) IMPLANT
RELOAD STAPLE 60 3.5 BLU DVNC (STAPLE) IMPLANT
RELOAD STAPLER 3.5X45 BLU DVNC (STAPLE) IMPLANT
RELOAD STAPLER 3.5X60 BLU DVNC (STAPLE) IMPLANT
RETRACTOR WOUND ALXS 18CM MED (MISCELLANEOUS) IMPLANT
RTRCTR WOUND ALEXIS O 18CM MED (MISCELLANEOUS)
SCISSORS METZENBAUM CVD 33 (INSTRUMENTS) IMPLANT
SEAL CANN UNIV 5-8 DVNC XI (MISCELLANEOUS) ×3 IMPLANT
SEAL XI 5MM-8MM UNIVERSAL (MISCELLANEOUS) ×4
SEALER VESSEL DA VINCI XI (MISCELLANEOUS)
SEALER VESSEL EXT DVNC XI (MISCELLANEOUS) IMPLANT
SOLUTION ELECTROLUBE (MISCELLANEOUS) ×1 IMPLANT
SPIKE FLUID TRANSFER (MISCELLANEOUS) ×1 IMPLANT
SPONGE DRAIN TRACH 4X4 STRL 2S (GAUZE/BANDAGES/DRESSINGS) IMPLANT
SPONGE T-LAP 18X18 ~~LOC~~+RFID (SPONGE) ×1 IMPLANT
SPONGE T-LAP 4X18 ~~LOC~~+RFID (SPONGE) ×1 IMPLANT
STAPLER 45 DA VINCI SURE FORM (STAPLE)
STAPLER 45 SUREFORM DVNC (STAPLE) IMPLANT
STAPLER 60 DA VINCI SURE FORM (STAPLE)
STAPLER 60 SUREFORM DVNC (STAPLE) IMPLANT
STAPLER CANNULA SEAL DVNC XI (STAPLE) ×1 IMPLANT
STAPLER CANNULA SEAL XI (STAPLE) ×1
STAPLER RELOAD 3.5X45 BLU DVNC (STAPLE)
STAPLER RELOAD 3.5X45 BLUE (STAPLE)
STAPLER RELOAD 3.5X60 BLU DVNC (STAPLE)
STAPLER RELOAD 3.5X60 BLUE (STAPLE)
STAPLER SKIN PROX 35W (STAPLE) IMPLANT
SUT ETHILON 3-0 FS-10 30 BLK (SUTURE) ×1
SUT MNCRL 4-0 (SUTURE) ×2
SUT MNCRL 4-0 27XMFL (SUTURE) ×2
SUT SILK 3 0 SH 30 (SUTURE) IMPLANT
SUT V-LOC 90 ABS 3-0 VLT  V-20 (SUTURE)
SUT V-LOC 90 ABS 3-0 VLT V-20 (SUTURE) ×2 IMPLANT
SUT VIC AB 3-0 SH 27 (SUTURE)
SUT VIC AB 3-0 SH 27X BRD (SUTURE) ×2 IMPLANT
SUT VICRYL 0 UR6 27IN ABS (SUTURE) IMPLANT
SUTURE EHLN 3-0 FS-10 30 BLK (SUTURE) IMPLANT
SUTURE MNCRL 4-0 27XMF (SUTURE) ×2 IMPLANT
TRAP FLUID SMOKE EVACUATOR (MISCELLANEOUS) ×1 IMPLANT
TRAY FOLEY MTR SLVR 16FR STAT (SET/KITS/TRAYS/PACK) ×1 IMPLANT
TROCAR Z-THREAD FIOS 11X100 BL (TROCAR) ×1 IMPLANT
TROCAR Z-THREAD OPTICAL 5X100M (TROCAR) IMPLANT
TUBING EVAC SMOKE HEATED PNEUM (TUBING) ×1 IMPLANT
WATER STERILE IRR 500ML POUR (IV SOLUTION) ×1 IMPLANT

## 2022-10-24 NOTE — Anesthesia Preprocedure Evaluation (Addendum)
Anesthesia Evaluation  Patient identified by MRN, date of birth, ID band Patient awake    Reviewed: Allergy & Precautions, H&P , NPO status , Patient's Chart, lab work & pertinent test results  Airway Mallampati: III  TM Distance: >3 FB Neck ROM: full    Dental no notable dental hx.    Pulmonary sleep apnea , Current Smoker and Patient abstained from smoking.   Pulmonary exam normal        Cardiovascular Exercise Tolerance: Poor hypertension, + CAD  Normal cardiovascular exam  EKG: Sinus rhythm Atrial premature complex Borderline right axis deviation Consider left ventricular hypertrophy Baseline wander in lead(s) II   Neuro/Psych negative neurological ROS  negative psych ROS   GI/Hepatic Neg liver ROS,GERD  ,,Bowel perforation   Endo/Other  negative endocrine ROS    Renal/GU AKI     Musculoskeletal   Abdominal   Peds  Hematology negative hematology ROS (+)   Anesthesia Other Findings Recently admitted 10/19/22 for possible bowel obstruction and discharged on 1/15/2. CT Scan today: IMPRESSION: 1. Pneumoperitoneum with moderate volume of abdominopelvic free fluid, consistent with hollow viscus perforation. 2. Luminal irregularity in the gastric antrum with intraluminal gas likely extending into the peritoneum, suspicious for perforated gastric ulcer. 3. Bibasilar ground-glass opacities and interlobular septal thickening with bronchiectasis, suggestive of pulmonary edema. 4. Trace right pleural effusion. 5.  Aortic Atherosclerosis (ICD10-I70.0).   Past Medical History: No date: CAD (coronary artery disease) No date: GERD (gastroesophageal reflux disease) No date: H/O cesarean section No date: Hypertension No date: Tobacco abuse  Past Surgical History: No date: CESAREAN SECTION  BMI    Body Mass Index: 21.26 kg/m      Reproductive/Obstetrics negative OB ROS                              Anesthesia Physical Anesthesia Plan  ASA: 3 and emergent  Anesthesia Plan: General ETT   Post-op Pain Management: Ofirmev IV (intra-op)*   Induction: Rapid sequence and Intravenous  PONV Risk Score and Plan: 2 and 4 or greater and Ondansetron, Dexamethasone, Midazolam and Treatment may vary due to age or medical condition  Airway Management Planned: Oral ETT  Additional Equipment:   Intra-op Plan:   Post-operative Plan: Extubation in OR  Informed Consent: I have reviewed the patients History and Physical, chart, labs and discussed the procedure including the risks, benefits and alternatives for the proposed anesthesia with the patient or authorized representative who has indicated his/her understanding and acceptance.     Dental Advisory Given  Plan Discussed with: CRNA and Surgeon  Anesthesia Plan Comments: (NG tube placed pre-op)        Anesthesia Quick Evaluation

## 2022-10-24 NOTE — Transfer of Care (Signed)
Immediate Anesthesia Transfer of Care Note  Patient: Robin Mckinney  Procedure(s) Performed: XI ROBOT ASSISTED GASTROSTOMY TUBE PLACEMENT (Abdomen)  Patient Location: PACU  Anesthesia Type:General  Level of Consciousness: awake and drowsy  Airway & Oxygen Therapy: Patient Spontanous Breathing  Post-op Assessment: Report given to RN and Post -op Vital signs reviewed and stable  Post vital signs: Reviewed and stable  Last Vitals:  Vitals Value Taken Time  BP 124/70 10/24/22 1244  Temp    Pulse 68 10/24/22 1244  Resp 13 10/24/22 1244  SpO2 100 % 10/24/22 1244  Vitals shown include unvalidated device data.  Last Pain:  Vitals:   10/24/22 1001  TempSrc: Temporal  PainSc: Asleep         Complications: No notable events documented.

## 2022-10-24 NOTE — ED Provider Notes (Addendum)
   [facility] Provider Note   I received this patient in signout pending CT abdomen and pelvis, presenting with upper abdominal pain.  I got a call from radiologist because the read appears to be perforated gastric ulcer.  She already received pantoprazole.  Her pain was relieved with Dilaudid earlier, pain is now 8 out of 10 with diffuse tenderness and guarding.  I ordered her for another dose of Dilaudid, NG tube, ceft/flagyl.  N.p.o. now.  Discussed with Dr. Christian Mate of general surgery at [phone].  She will be admitted for perforated viscus.   PROCEDURES:  Critical Care performed: Yes, see critical care procedure note(s)  .Critical Care  Performed by: Lucillie Garfinkel, MD Authorized by: Lucillie Garfinkel, MD   Critical care provider statement:    Critical care time (minutes):  30   Critical care was necessary to treat or prevent imminent or life-threatening deterioration of the following conditions: Perforated viscus.   Critical care was time spent personally by me on the following activities:  Development of treatment plan with patient or surrogate, discussions with consultants, evaluation of patient's response to treatment, examination of patient, ordering and review of laboratory studies, ordering and review of radiographic studies, ordering and performing treatments and interventions, pulse oximetry, re-evaluation of patient's condition and review of old charts    Lucillie Garfinkel, MD 10/24/22 [place], [place], MD 10/24/22 [phone]

## 2022-10-24 NOTE — Interval H&P Note (Signed)
History and Physical Interval Note:  10/24/2022 9:54 AM  Robin Mckinney  has presented today for surgery, with the diagnosis of perforated UGI viscus.  The various methods of treatment have been discussed with the patient and family. After consideration of risks, benefits and other options for treatment, the patient has consented to   Robotic assisted diagnostic laparotomy, repair of gastric perforation, Gaham patch, possible exploratory laparotomy as a surgical intervention.  The patient's history has been reviewed, patient examined, no change in status, stable for surgery.  I have reviewed the patient's chart and labs.  Questions were answered to the patient's satisfaction.     Ronny Bacon

## 2022-10-24 NOTE — ED Notes (Signed)
Patient transported to CT 

## 2022-10-24 NOTE — ED Provider Notes (Signed)
[facility] Provider Note    Event Date/Time   First MD Initiated Contact with Patient 10/24/22 0423     (approximate)   History   Abdominal Pain   HPI  Robin Mckinney is a 62 y.o. female history of hypertension GERD, CAD who presents to the emergency department upper abdominal pain.  No nausea, vomiting or diarrhea.  Recently admitted 10/19/22 for possible bowel obstruction and discharged on 10/22/22.  Did not require NG tube.  States last bowel movement was yesterday.  No chest pain but does feel short of breath.  No fever or cough.  No GU symptoms.  Has had previous C-section, BTL.   History provided by patient and EMS.    Past Medical History:  Diagnosis Date   CAD (coronary artery disease)    GERD (gastroesophageal reflux disease)    H/O cesarean section    Hypertension    Tobacco abuse     Past Surgical History:  Procedure Laterality Date   CESAREAN SECTION      MEDICATIONS:  Prior to Admission medications   Medication Sig Start Date End Date Taking? Authorizing Provider  aspirin EC 81 MG tablet Take 1 tablet (81 mg total) by mouth daily. 10/22/22   Sunnie Nielsen, DO  calcium carbonate (TUMS - DOSED IN MG ELEMENTAL CALCIUM) 500 MG chewable tablet Chew 1-2 tablets (200-400 mg of elemental calcium total) by mouth 3 (three) times daily as needed for indigestion or heartburn. 10/22/22   Sunnie Nielsen, DO  famotidine (PEPCID) 20 MG tablet Take 1 tablet (20 mg total) by mouth 2 (two) times daily. 10/22/22   Sunnie Nielsen, DO  lisinopril-hydrochlorothiazide (PRINZIDE,ZESTORETIC) 10-12.5 MG tablet Take 1 tablet by mouth daily.    [provider]  naproxen (NAPROSYN) 500 MG tablet Take 1 tablet (500 mg total) by mouth 2 (two) times daily as needed for mild pain or moderate pain. 10/22/22   Sunnie Nielsen, DO  nicotine (NICODERM CQ - DOSED IN MG/24 HOURS) 21 mg/24hr patch Place 1 patch (21 mg total) onto the skin daily.  10/22/22   Sunnie Nielsen, DO  polyethylene glycol (MIRALAX / GLYCOLAX) 17 g packet Take 17 g by mouth daily. 10/22/22   Sunnie Nielsen, DO    Physical Exam   Triage Vital Signs: ED Triage Vitals  Enc Vitals Group     BP 10/24/22 0429 104/74     Pulse Rate 10/24/22 0429 88     Resp 10/24/22 0429 20     Temp 10/24/22 0500 98 F (36.7 C)     Temp Source 10/24/22 0500 Oral     SpO2 10/24/22 0429 100 %     Weight 10/24/22 0448 120 lb (54.4 kg)     Height 10/24/22 0448 5\' 3"  (1.6 m)     Head Circumference --      Peak Flow --      Pain Score --      Pain Loc --      Pain Edu? --      Excl. in GC? --        Most recent vital signs: Vitals:   10/24/22 0500 10/24/22 0629  BP:  (!) 149/99  Pulse:  80  Resp:  20  Temp: 98 F (36.7 C)   SpO2:  100%    CONSTITUTIONAL: Alert, responds appropriately to questions.  Thin, appears uncomfortable HEAD: Normocephalic, atraumatic EYES: Conjunctivae clear, pupils appear equal, sclera nonicteric ENT: normal nose; moist mucous membranes NECK: Supple, normal  ROM CARD: RRR; S1 and S2 appreciated RESP: Normal chest excursion without splinting or tachypnea; breath sounds clear and equal bilaterally; no wheezes, no rhonchi, no rales, no hypoxia or respiratory distress, speaking full sentences ABD/GI: Non-distended; soft, tender to palpation diffusely with intermittent guarding, no rebound BACK: The back appears normal EXT: Normal ROM in all joints; no deformity noted, no edema SKIN: Normal color for age and race; warm; no rash on exposed skin NEURO: Moves all extremities equally, normal speech PSYCH: The patient's mood and manner are appropriate.   ED Results / Procedures / Treatments   LABS: (all labs ordered are listed, but only abnormal results are displayed) Labs Reviewed  CBC WITH DIFFERENTIAL/PLATELET - Abnormal; Notable for the following components:      Result Value   Platelets 455 (*)    Neutro Abs 8.0 (*)    All  other components within normal limits  COMPREHENSIVE METABOLIC PANEL - Abnormal; Notable for the following components:   Sodium 131 (*)    Potassium 3.4 (*)    Chloride 94 (*)    Glucose, Bld 125 (*)    BUN 35 (*)    Creatinine, Ser 1.54 (*)    GFR, Estimated 38 (*)    All other components within normal limits  LIPASE, BLOOD  ETHANOL  URINALYSIS, ROUTINE W REFLEX MICROSCOPIC  URINE DRUG SCREEN, QUALITATIVE ([facility] ONLY)  TROPONIN I (HIGH SENSITIVITY)  TROPONIN I (HIGH SENSITIVITY)     EKG:  EKG Interpretation  Date/Time:  Wednesday October 24 2022 04:55:36 EST Ventricular Rate:  90 PR Interval:  161 QRS Duration: 90 QT Interval:  385 QTC Calculation: 472 R Axis:   90 Text Interpretation: Sinus rhythm Atrial premature complex Borderline right axis deviation Consider left ventricular hypertrophy Baseline wander in lead(s) II Confirmed by Pryor Curia [id number] on 10/24/2022 5:14:22 AM         RADIOLOGY: My personal review and interpretation of imaging: Chest x-ray clear.  I have personally reviewed all radiology reports.   DG Chest Portable 1 View  Result Date: 10/24/2022 CLINICAL DATA:  Shortness of breath EXAM: PORTABLE CHEST 1 VIEW COMPARISON:  10/20/2022 FINDINGS: Low volume chest with hazy and streaky density at the lung bases. Stable heart size and mediastinal contours with aortic tortuosity. No effusion or pneumothorax. Left upper quadrant gas attributed to stomach. IMPRESSION: Low volume chest with atelectatic type opacity at the bases. Electronically Signed   By: Jorje Guild M.D.   On: 10/24/2022 05:02     PROCEDURES:  Critical Care performed: No   .   .1-3 Lead EKG Interpretation  Performed by: Shaquanna Lycan, Delice Bison, DO Authorized by: Abiageal Blowe, Delice Bison, DO     Interpretation: normal     ECG rate assessment: normal     Rhythm: sinus rhythm     Ectopy: none     Conduction: normal       IMPRESSION / MDM / ASSESSMENT AND PLAN / ED COURSE  I reviewed the  triage vital signs and the nursing notes.    Patient here with upper abdominal pain, shortness of breath.  The patient is on the cardiac monitor to evaluate for evidence of arrhythmia and/or significant heart rate changes.   DIFFERENTIAL DIAGNOSIS (includes but not limited to):   Gastritis, GERD, pancreatitis, cholelithiasis, cholecystitis, appendicitis, recurrent bowel obstruction, colitis, ACS, pneumonia, PE   Patient's presentation is most consistent with acute presentation with potential threat to life or bodily function.   PLAN: Will obtain CBC, CMP, lipase,  troponin x 2, chest x-ray, CT of the abdomen pelvis, EKG.  Will give IV fluids, Protonix, pain and nausea medicine.   MEDICATIONS GIVEN IN ED: Medications  iohexol (OMNIPAQUE) 9 MG/ML oral solution 500 mL (has no administration in time range)  morphine (PF) 4 MG/ML injection 4 mg (4 mg Intravenous Given 10/24/22 0440)  ondansetron (ZOFRAN) injection 4 mg (4 mg Intravenous Given 10/24/22 0438)  sodium chloride 0.9 % bolus 1,000 mL (0 mLs Intravenous Stopped 10/24/22 0630)  pantoprazole (PROTONIX) injection 40 mg (40 mg Intravenous Given 10/24/22 0442)  HYDROmorphone (DILAUDID) injection 1 mg (1 mg Intravenous Given 10/24/22 0503)     ED COURSE: Patient's labs show no leukocytosis.  Normal hemoglobin.  Creatinine slightly elevated from baseline at 1.54.  She is getting IV fluids.  Normal electrolytes.  Normal LFTs, lipase.  First troponin negative.  Second pending.  Ethanol level negative.  Chest x-ray reviewed and interpreted by myself and the radiologist and shows no acute abnormality.  EKG is nonischemic.  CT of the abdomen pelvis, urinalysis pending.  Patient's pain has been well-controlled after morphine and then Dilaudid and she is now sleeping.  Signed out to oncoming ED physician.      CONSULTS: Pending further workup.   OUTSIDE RECORDS REVIEWED: Reviewed patient's last admission from October 19, 2022 to October 22, 2022 for possible bowel obstruction.       FINAL CLINICAL IMPRESSION(S) / ED DIAGNOSES   Final diagnoses:  Upper abdominal pain  AKI (acute kidney injury) ([place])     Rx / DC Orders   ED Discharge Orders     None        Note:  This document was prepared using Dragon voice recognition software and may include unintentional dictation errors.   Emmett Bracknell, Delice Bison, DO 10/24/22 [phone]

## 2022-10-24 NOTE — Anesthesia Procedure Notes (Signed)
Procedure Name: Intubation Date/Time: 10/24/2022 10:19 AM  Performed by: Johnna Acosta, CRNAPre-anesthesia Checklist: Patient identified, Emergency Drugs available, Suction available, Patient being monitored and Timeout performed Patient Re-evaluated:Patient Re-evaluated prior to induction Oxygen Delivery Method: Circle system utilized Preoxygenation: Pre-oxygenation with 100% oxygen Induction Type: IV induction, Rapid sequence and Cricoid Pressure applied Laryngoscope Size: McGraph and 3 Grade View: Grade I Tube type: Oral Tube size: 6.5 mm Number of attempts: 1 Airway Equipment and Method: Stylet and Video-laryngoscopy Placement Confirmation: ETT inserted through vocal cords under direct vision, positive ETCO2 and breath sounds checked- equal and bilateral Secured at: 20 cm Tube secured with: Tape Dental Injury: Teeth and Oropharynx as per pre-operative assessment

## 2022-10-24 NOTE — ED Triage Notes (Signed)
Pt arrives from home via [facility].  C/O abdominal pain under bilateral breast that began at around 0100 this morning.  Pt states pain is 10/10 sharp and intermittent.  Pt denies N/V/D.  Pt recently D/C with bowel obstruction.

## 2022-10-24 NOTE — H&P (Signed)
[facility] SURGICAL HISTORY & PHYSICAL (cpt [phone])  HISTORY OF PRESENT ILLNESS (HPI):  62 y.o. female presented to [facility] ED today for abdominal pain. Patient with recent admission for nausea/emesis thought to be secondary to gastroenteritis vs partial SBO. She ultimately did very well, never required NGT placement, diet advanced and was discharged home on 1/15. She unfortunately had return of severe upper abdominal pain suddenly last night. She denied acute inciting events. Did have some nausea. No fever, chills, emesis, CP< SOB, urinary changes or bowel changes. This pain is much different than her presenting pain from last week.  Work up in the ED revealed normal WBC at 9.3K, she did have AKI with sCr - 1.54, mild hypokalemia to 3.4. CT Abdomen/Pelvis was obtained and concerning for likely perforated gastric ulcer.   General surgery was consulted by emergency medicine physician Dr Pilar Jarvis, MD for evaluation and management of gastric perforation.    PAST MEDICAL HISTORY (PMH):  Past Medical History:  Diagnosis Date   CAD (coronary artery disease)    GERD (gastroesophageal reflux disease)    H/O cesarean section    Hypertension    Tobacco abuse     Reviewed. Otherwise negative.   PAST SURGICAL HISTORY (PSH):  Past Surgical History:  Procedure Laterality Date   CESAREAN SECTION      Reviewed. Otherwise negative.   MEDICATIONS:  Prior to Admission medications   Medication Sig Start Date End Date Taking? Authorizing Provider  aspirin EC 81 MG tablet Take 1 tablet (81 mg total) by mouth daily. 10/22/22   Sunnie Nielsen, DO  calcium carbonate (TUMS - DOSED IN MG ELEMENTAL CALCIUM) 500 MG chewable tablet Chew 1-2 tablets (200-400 mg of elemental calcium total) by mouth 3 (three) times daily as needed for indigestion or heartburn. 10/22/22   Sunnie Nielsen, DO  famotidine (PEPCID) 20 MG tablet Take 1 tablet (20 mg total) by mouth 2 (two) times daily. 10/22/22    Sunnie Nielsen, DO  lisinopril-hydrochlorothiazide (PRINZIDE,ZESTORETIC) 10-12.5 MG tablet Take 1 tablet by mouth daily.    [provider]  naproxen (NAPROSYN) 500 MG tablet Take 1 tablet (500 mg total) by mouth 2 (two) times daily as needed for mild pain or moderate pain. 10/22/22   Sunnie Nielsen, DO  nicotine (NICODERM CQ - DOSED IN MG/24 HOURS) 21 mg/24hr patch Place 1 patch (21 mg total) onto the skin daily. 10/22/22   Sunnie Nielsen, DO  polyethylene glycol (MIRALAX / GLYCOLAX) 17 g packet Take 17 g by mouth daily. 10/22/22   Sunnie Nielsen, DO     ALLERGIES:  No Known Allergies   SOCIAL HISTORY:  Social History   Socioeconomic History   Marital status: Single    Spouse name: Not on file   Number of children: Not on file   Years of education: Not on file   Highest education level: Not on file  Occupational History   Not on file  Tobacco Use   Smoking status: Every Day    Packs/day: 0.50    Types: Cigarettes   Smokeless tobacco: Never  Substance and Sexual Activity   Alcohol use: Yes   Drug use: No   Sexual activity: Not on file  Other Topics Concern   Not on file  Social History Narrative   Not on file   Social Determinants of Health   Financial Resource Strain: Not on file  Food Insecurity: No Food Insecurity (10/19/2022)   Hunger Vital Sign    Worried About Running Out  of Food in the Last Year: Never true    [place] in the Last Year: Never true  Transportation Needs: No Transportation Needs (10/19/2022)   PRAPARE - Hydrologist (Medical): No    Lack of Transportation (Non-Medical): No  Physical Activity: Not on file  Stress: Not on file  Social Connections: Not on file  Intimate Partner Violence: Not At Risk (10/19/2022)   Humiliation, Afraid, Rape, and Kick questionnaire    Fear of Current or Ex-Partner: No    Emotionally Abused: No    Physically Abused: No    Sexually Abused: No     FAMILY  HISTORY:  Family History  Problem Relation Age of Onset   Hypertension Mother    Hypertension Father     Otherwise negative.   REVIEW OF SYSTEMS:  Review of Systems  Constitutional:  Negative for chills and fever.  HENT:  Negative for congestion and sore throat.   Respiratory:  Negative for cough and shortness of breath.   Cardiovascular:  Negative for chest pain and palpitations.  Gastrointestinal:  Positive for abdominal pain and nausea. Negative for constipation, diarrhea and vomiting.  Genitourinary:  Negative for dysuria and urgency.  All other systems reviewed and are negative.   VITAL SIGNS:  Temp:  [98 F (36.7 C)] 98 F (36.7 C) (01/17 0500) Pulse Rate:  [80-102] 102 (01/17 0700) Resp:  [20-33] 25 (01/17 0750) BP: (104-149)/(71-104) 104/71 (01/17 0750) SpO2:  [86 %-100 %] 98 % (01/17 0750) Weight:  [54.4 kg] 54.4 kg (01/17 0448)     Height: 5\' 3"  (160 cm) Weight: 54.4 kg BMI (Calculated): 21.26   PHYSICAL EXAM:  Physical Exam Vitals and nursing note reviewed. Exam conducted with a chaperone present.  Constitutional:      General: She is not in acute distress.    Appearance: She is well-developed. She is not ill-appearing.     Comments: Patient laying in bed; appears uncomfortable but able to move around in the bed  HENT:     Head: Normocephalic and atraumatic.  Eyes:     General: No scleral icterus.    Extraocular Movements: Extraocular movements intact.  Cardiovascular:     Rate and Rhythm: Tachycardia present.     Heart sounds: Normal heart sounds.  Pulmonary:     Effort: Pulmonary effort is normal. No respiratory distress.  Abdominal:     General: Abdomen is flat. There is no distension.     Palpations: Abdomen is soft.     Tenderness: There is abdominal tenderness in the epigastric area and periumbilical area. There is guarding. There is no rebound.     Hernia: No hernia is present.     Comments: Abdomen is soft, she is very point tender in central  upper abdomen with associated guarding, non-distended.  Genitourinary:    Comments: Deferred Skin:    General: Skin is warm and dry.     Coloration: Skin is not jaundiced or pale.  Neurological:     General: No focal deficit present.     Mental Status: She is alert and oriented to person, place, and time.  Psychiatric:        Mood and Affect: Mood normal.        Behavior: Behavior normal.     INTAKE/OUTPUT:  This shift: No intake/output data recorded.  Last 2 shifts: @IOLAST2SHIFTS @  Labs:     Latest Ref Rng & Units 10/24/2022    4:29 AM 10/20/2022  7:50 AM 10/19/2022    3:14 AM  CBC  WBC 4.0 - 10.5 K/uL 9.3  6.0  5.9   Hemoglobin 12.0 - 15.0 g/dL 12.3  10.6  10.9   Hematocrit 36.0 - 46.0 % 38.2  32.5  33.8   Platelets 150 - 400 K/uL 455  417  416       Latest Ref Rng & Units 10/24/2022    4:29 AM 10/22/2022    4:22 AM 10/20/2022    7:50 AM  CMP  Glucose 70 - 99 mg/dL 125  116  76   BUN 8 - 23 mg/dL 35  15  17   Creatinine 0.44 - 1.00 mg/dL 1.54  0.96  0.79   Sodium 135 - 145 mmol/L 131  131  136   Potassium 3.5 - 5.1 mmol/L 3.4  3.9  3.3   Chloride 98 - 111 mmol/L 94  95  100   CO2 22 - 32 mmol/L 25  29  24    Calcium 8.9 - 10.3 mg/dL 9.2  9.2  8.7   Total Protein 6.5 - 8.1 g/dL 7.0     Total Bilirubin 0.3 - 1.2 mg/dL 0.7     Alkaline Phos 38 - 126 U/L 57     AST 15 - 41 U/L 22     ALT 0 - 44 U/L 11        Imaging studies:   CT Abdomen/Pelvis (10/24/2022) personally reviewed showing gross pneumoperitoneum seemingly arising from distal stomach vs proximal duodenum, there is free fluid around the liver, and radiologist report reviewed below:   IMPRESSION: 1. Pneumoperitoneum with moderate volume of abdominopelvic free fluid, consistent with hollow viscus perforation. 2. Luminal irregularity in the gastric antrum with intraluminal gas likely extending into the peritoneum, suspicious for perforated gastric ulcer. 3. Bibasilar ground-glass opacities and  interlobular septal thickening with bronchiectasis, suggestive of pulmonary edema. 4. Trace right pleural effusion. 5.  Aortic Atherosclerosis (ICD10-I70.0).   Assessment/Plan: (ICD-10's: K25.1) 62 y.o. female with upper abdominal pain found to have likely perforated peptic ulcer   - Will admit to general surgery - Plan for urgent robotic assisted diagnostic laparoscopy, possible gastric ulcer perforation repair, possible Phillip Heal patch, possible exploratory laparotomy   - All risks, benefits, and alternatives to above procedure(s) were discussed with the patient, all of her questions were answered to her expressed satisfaction, patient expresses she wishes to proceed, and informed consent was obtained.    - Will need to be strict NPO; likely for 3-5 days post-op as well  - IVF Resuscitation; monitor renal function given AKI - NGT placement; low-intermittent suction - IV PPI - IV Abx (Rocephin, Flagyl) - Monitor abdominal examination - Pain control prn; antiemetics prn  - Morning labs: CBC, BMP, electrolytes  - DVT prophylaxis; hold for OR  All of the above findings and recommendations were discussed with the patient, and all of her questions were answered to her expressed satisfaction.  -- Edison Simon, PA-C [facility] 10/24/2022, 9:04 AM M-F: 7am - 4pm

## 2022-10-24 NOTE — Op Note (Signed)
Robotic repair of gastric perforation with Phillip Heal patch.   Pre-operative Diagnosis: Gastric perforation  Post-operative Diagnosis: same.    Surgeon: Ronny Bacon, M.D., [facility]  Anesthesia: General endotracheal  Findings: Distal gastric/prepyloric ulcer with 5 mm perforation  Estimated Blood Loss: 15 mL         Specimens: None          Complications: none              Procedure Details  The patient was seen again in the Holding Room. The benefits, complications, treatment options, and expected outcomes were discussed with the patient. The risks of bleeding, infection, recurrence of symptoms, failure to resolve symptoms, unanticipated injury, prosthetic placement, prosthetic infection, any of which could require further surgery were reviewed with the patient. The likelihood of improving the patient's symptoms with return to their baseline status is anticipated/hopeful.  The patient and/or family concurred with the proposed plan, giving informed consent.  The patient was taken to Operating Room, identified and the procedure verified.    Prior to the induction of general anesthesia, antibiotic prophylaxis was administered. VTE prophylaxis was in place.  General anesthesia was then administered and tolerated well. After the induction, the patient was positioned in the supine position and the abdomen was prepped with Chloraprep and draped in the sterile fashion.  A Time Out was held and the above information confirmed.  After local infiltration of quarter percent Marcaine with epinephrine, stab incision was made left upper quadrant.  Just below the costal margin approximately midclavicular line the Veress needle is passed with sensation of the layers to penetrate the abdominal wall and into the peritoneum.  Saline drop test is confirmed peritoneal placement.  Insufflation is initiated with carbon dioxide to pressures of 15 mmHg.  Local infiltration is utilized for the 4 robotic site trocars.   To the right infraumbilical abdomen placed a 12 mm Visiport under direct visualization.  Under direct visualization then placed an additional 8 mm robotic port in the right lower quadrant, left abdominal wall and left upper quadrant.  The [employer] robot was then docked from the patient's left side. Bile-stained peritoneal fluid was identified along with significant evidence of peritonitis and fibrinous exudates.  Photos taken. Prior to complete docking 2-0 silk sutures were placed within the abdomen. The defect itself was then closed with 2-0 silk suture, leaving the tails long.  An additional seromuscular sutures were applied above and below the closure.  Free omentum was utilized and placed over the repair, securing it with all the details of the above suture.  We then removed all the needles and residual suture.  We then undocked the robot.  I then utilized irrigation of 6 L to cleanse the abdomen and multiple small aliquots irrigating and suctioning the fibrinous exudative debris.  I then placed a 19 Blake drain via the left upper quadrant port site and passed it across the cephalad aspect of the repair and placed it under the liver and gallbladder.  It was secured at the skin with 3-0 nylon. The abdomen was then desufflated, the midline fascia was approximated with 0 Vicryl under direct visualization.  The remaining port sites were closed with subcuticular's of 4-0 Monocryl and sealed with Dermabond.  Patient tolerated procedure well.     Ronny Bacon M.D., [facility] 10/24/2022 12:31 PM

## 2022-10-24 NOTE — ED Notes (Signed)
Pt noted to have vomited over the right side of her bed onto the floor. Pt appears to be asleep, is woken up by this tech and [place], EDT. Vomit cleaned up and linens changed. Pt endorses pain to the LLQ that radiates up her abdomen. Pt drank about 0.5 of 1 bottle of contrast and states that is what she vomited. RN made aware.

## 2022-10-25 LAB — BASIC METABOLIC PANEL WITH GFR
Anion gap: 7 (ref 5–15)
BUN: 32 mg/dL — ABNORMAL HIGH (ref 8–23)
CO2: 24 mmol/L (ref 22–32)
Calcium: 8.1 mg/dL — ABNORMAL LOW (ref 8.9–10.3)
Chloride: 105 mmol/L (ref 98–111)
Creatinine, Ser: 1.19 mg/dL — ABNORMAL HIGH (ref 0.44–1.00)
GFR, Estimated: 52 mL/min — ABNORMAL LOW (ref >60)
Glucose, Bld: 78 mg/dL (ref 70–99)
Potassium: 4 mmol/L (ref 3.5–5.1)
Sodium: 136 mmol/L (ref 135–145)

## 2022-10-25 LAB — CBC
HCT: 29.8 % — ABNORMAL LOW (ref 36.0–46.0)
Hemoglobin: 9.8 g/dL — ABNORMAL LOW (ref 12.0–15.0)
MCH: 29.3 pg (ref 26.0–34.0)
MCHC: 32.9 g/dL (ref 30.0–36.0)
MCV: 89 fL (ref 80.0–100.0)
Platelets: 348 K/uL (ref 150–400)
RBC: 3.35 MIL/uL — ABNORMAL LOW (ref 3.87–5.11)
RDW: 14.2 % (ref 11.5–15.5)
WBC: 13.8 K/uL — ABNORMAL HIGH (ref 4.0–10.5)
nRBC: 0 % (ref 0.0–0.2)

## 2022-10-25 LAB — URINALYSIS, ROUTINE W REFLEX MICROSCOPIC
Bacteria, UA: NONE SEEN
Bilirubin Urine: NEGATIVE
Glucose, UA: NEGATIVE mg/dL
Ketones, ur: 20 mg/dL — AB
Nitrite: NEGATIVE
Protein, ur: 30 mg/dL — AB
Specific Gravity, Urine: 1.027 (ref 1.005–1.030)
pH: 5 (ref 5.0–8.0)

## 2022-10-25 LAB — URINE DRUG SCREEN, QUALITATIVE (ARMC ONLY)
Amphetamines, Ur Screen: NOT DETECTED
Barbiturates, Ur Screen: NOT DETECTED
Benzodiazepine, Ur Scrn: NOT DETECTED
Cannabinoid 50 Ng, Ur ~~LOC~~: NOT DETECTED
Cocaine Metabolite,Ur ~~LOC~~: POSITIVE — AB
MDMA (Ecstasy)Ur Screen: NOT DETECTED
Methadone Scn, Ur: NOT DETECTED
Opiate, Ur Screen: POSITIVE — AB
Phencyclidine (PCP) Ur S: NOT DETECTED
Tricyclic, Ur Screen: NOT DETECTED

## 2022-10-25 LAB — PHOSPHORUS: Phosphorus: 3.6 mg/dL (ref 2.5–4.6)

## 2022-10-25 LAB — MAGNESIUM: Magnesium: 1.8 mg/dL (ref 1.7–2.4)

## 2022-10-25 NOTE — Progress Notes (Signed)
Foley dc'd this am per protocol and also verified w/ surgery. Pt tolerated well, no bleeding or pain noted. Pt awake and alert, no signs of distress, will monitor

## 2022-10-25 NOTE — Progress Notes (Signed)
[facility] Day(s): 1.   Post op day(s): 1 Day Post-Op.   Interval History:  Patient seen and examined No acute events or new complaints overnight.  Patient reports she is feeling much better this morning Minimal abdominal soreness No fever, chills, nausea, emesis Leukocytosis to 13.8 this morning; likely reactive Hgb to 9.8; suspect this is more dilutional; no evidence of bleeding AKI improving; sCr - 1.19; UO - 225 ccs recorded  No electrolyte derangements NGT with 450 ccs out Drain with 80 ccs; clearing serous fluid She is NPO Continues on Rocephin + Flagyl   Vital signs in last 24 hours: [min-max] current  Temp:  [97.2 F (36.2 C)-98.8 F (37.1 C)] 98.8 F (37.1 C) (01/18 0540) Pulse Rate:  [61-85] 84 (01/18 0540) Resp:  [9-33] 18 (01/18 0540) BP: (100-127)/(68-86) 113/71 (01/18 0540) SpO2:  [93 %-100 %] 97 % (01/18 0540)     Height: 5\' 3"  (160 cm) Weight: 54.4 kg BMI (Calculated): 21.26   Intake/Output last 2 shifts:  01/17 0701 - 01/18 0700 In: 3115.5 [I.V.:2655.8; NG/GT:60; IV Piggyback:399.7] Out: 760 [Urine:225; Emesis/NG output:450; Drains:80; Blood:5]   Physical Exam:  Constitutional: alert, cooperative and no distress  HEENT: NGT in place; minimal output in canister; this appears to be clearing Respiratory: breathing non-labored at rest  Cardiovascular: regular rate and sinus rhythm  Gastrointestinal: soft, non-tender, and non-distended. No rebound/guarding. Surgical drain present; output murky serous fluid Integumentary: Laparoscopic incisions are CDI with dermabond, no erythema or drainage  Labs:     Latest Ref Rng & Units 10/25/2022    4:35 AM 10/24/2022    4:29 AM 10/20/2022    7:50 AM  CBC  WBC 4.0 - 10.5 K/uL 13.8  9.3  6.0   Hemoglobin 12.0 - 15.0 g/dL 9.8  12.3  10.6   Hematocrit 36.0 - 46.0 % 29.8  38.2  32.5   Platelets 150 - 400 K/uL 348  455  417       Latest Ref Rng & Units 10/25/2022     4:35 AM 10/24/2022    4:29 AM 10/22/2022    4:22 AM  CMP  Glucose 70 - 99 mg/dL 78  125  116   BUN 8 - 23 mg/dL 32  35  15   Creatinine 0.44 - 1.00 mg/dL 1.19  1.54  0.96   Sodium 135 - 145 mmol/L 136  131  131   Potassium 3.5 - 5.1 mmol/L 4.0  3.4  3.9   Chloride 98 - 111 mmol/L 105  94  95   CO2 22 - 32 mmol/L 24  25  29    Calcium 8.9 - 10.3 mg/dL 8.1  9.2  9.2   Total Protein 6.5 - 8.1 g/dL  7.0    Total Bilirubin 0.3 - 1.2 mg/dL  0.7    Alkaline Phos 38 - 126 U/L  57    AST 15 - 41 U/L  22    ALT 0 - 44 U/L  11       Imaging studies: No new pertinent imaging studies   Assessment/Plan:  62 y.o. female 1 Day Post-Op s/p robotic assisted repair of gastric perforation with Phillip Heal patch.   - She will need to continue to be NPO for 3-5 days post-op to allow time for healing; Will likely proceed with UGI to ensure adequate repair and no leak before removing NG and proceeding with diet.  - Continue NGT decompression for now; LIS; monitor and  record output - Continue IVF Resuscitation - Continue IV Abx (Ceftriaxone, Flagyl); ? Empirically treat for H pylori - Continue IV PPI; will need this PO at DC - Monitor abdominal examination; on-going bowel function - Continue surgical drain; monitor and record output   - Pain control prn; NO NSAIDs - Antiemetics prn - Monitor leukocytosis - Monitor renal function   - Okay to mobilize; engage PT if needed    All of the above findings and recommendations were discussed with the patient, and the medical team, and all of patient's questions were answered to her expressed satisfaction.  -- Edison Simon, PA-C [facility] 10/25/2022, 7:18 AM M-F: 7am - 4pm

## 2022-10-25 NOTE — Progress Notes (Signed)
  Transition of Care [facility]) Screening Note   Patient Details  Name: Robin Mckinney Date of Birth: Nov 07, 1960   Transition of Care [facility]) CM/SW Contact:    Tiburcio Bash, LCSW Phone Number: 10/25/2022, 10:37 AM   Transition of Care Department [facility]) has reviewed patient and no TOC needs have been identified at this time. We will continue to monitor patient advancement through interdisciplinary progression rounds. If new patient transition needs arise, please place a TOC consult.  Kelby Fam, [place], MSW, [place]

## 2022-10-25 NOTE — Anesthesia Postprocedure Evaluation (Signed)
Anesthesia Post Note  Patient: Robin Mckinney  Procedure(s) Performed: XI ROBOT ASSISTED GASTROSTOMY TUBE PLACEMENT (Abdomen)  Patient location during evaluation: PACU Anesthesia Type: General Level of consciousness: awake and alert Pain management: pain level controlled Vital Signs Assessment: post-procedure vital signs reviewed and stable Respiratory status: spontaneous breathing, nonlabored ventilation and respiratory function stable Cardiovascular status: blood pressure returned to baseline and stable Postop Assessment: no apparent nausea or vomiting Anesthetic complications: no   No notable events documented.   Last Vitals:  Vitals:   10/24/22 2000 10/25/22 0540  BP: 102/68 113/71  Pulse: 68 84  Resp: 17 18  Temp: 36.9 C 37.1 C  SpO2: 96% 97%    Last Pain:  Vitals:   10/25/22 0617  TempSrc:   PainSc: Asleep                 [place] Ouch

## 2022-10-26 LAB — BASIC METABOLIC PANEL
Anion gap: 8 (ref 5–15)
BUN: 25 mg/dL — ABNORMAL HIGH (ref 8–23)
CO2: 19 mmol/L — ABNORMAL LOW (ref 22–32)
Calcium: 8 mg/dL — ABNORMAL LOW (ref 8.9–10.3)
Chloride: 109 mmol/L (ref 98–111)
Creatinine, Ser: 1.06 mg/dL — ABNORMAL HIGH (ref 0.44–1.00)
GFR, Estimated: 59 mL/min — ABNORMAL LOW (ref >60)
Glucose, Bld: 63 mg/dL — ABNORMAL LOW (ref 70–99)
Potassium: 3.5 mmol/L (ref 3.5–5.1)
Sodium: 136 mmol/L (ref 135–145)

## 2022-10-26 LAB — CBC
HCT: 27.1 % — ABNORMAL LOW (ref 36.0–46.0)
Hemoglobin: 8.7 g/dL — ABNORMAL LOW (ref 12.0–15.0)
MCH: 28.9 pg (ref 26.0–34.0)
MCHC: 32.1 g/dL (ref 30.0–36.0)
MCV: 90 fL (ref 80.0–100.0)
Platelets: 333 10*3/uL (ref 150–400)
RBC: 3.01 MIL/uL — ABNORMAL LOW (ref 3.87–5.11)
RDW: 14.7 % (ref 11.5–15.5)
WBC: 14.2 10*3/uL — ABNORMAL HIGH (ref 4.0–10.5)
nRBC: 0 % (ref 0.0–0.2)

## 2022-10-26 LAB — MAGNESIUM: Magnesium: 2.1 mg/dL (ref 1.7–2.4)

## 2022-10-26 LAB — PHOSPHORUS: Phosphorus: 2.2 mg/dL — ABNORMAL LOW (ref 2.5–4.6)

## 2022-10-26 NOTE — Progress Notes (Addendum)
POD # 2 Rob gastric perf repair  Doing very well.  Minimal pain.  Ambulating AVSS 2gm drop Hb likely dilutional and pos op Drain serus  PE NAD Abd: soft, incisions c/d/I  A/p Doing well Upper GI series Monday and remove ng + advance diet if study is good [employer]

## 2022-10-27 LAB — BASIC METABOLIC PANEL
Anion gap: 10 (ref 5–15)
BUN: 17 mg/dL (ref 8–23)
CO2: 19 mmol/L — ABNORMAL LOW (ref 22–32)
Calcium: 7.7 mg/dL — ABNORMAL LOW (ref 8.9–10.3)
Chloride: 109 mmol/L (ref 98–111)
Creatinine, Ser: 0.86 mg/dL (ref 0.44–1.00)
GFR, Estimated: >60 mL/min (ref >60)
Glucose, Bld: 65 mg/dL — ABNORMAL LOW (ref 70–99)
Potassium: 3.2 mmol/L — ABNORMAL LOW (ref 3.5–5.1)
Sodium: 138 mmol/L (ref 135–145)

## 2022-10-27 LAB — CBC
HCT: 25.4 % — ABNORMAL LOW (ref 36.0–46.0)
Hemoglobin: 8.1 g/dL — ABNORMAL LOW (ref 12.0–15.0)
MCH: 28.5 pg (ref 26.0–34.0)
MCHC: 31.9 g/dL (ref 30.0–36.0)
MCV: 89.4 fL (ref 80.0–100.0)
Platelets: 332 10*3/uL (ref 150–400)
RBC: 2.84 MIL/uL — ABNORMAL LOW (ref 3.87–5.11)
RDW: 14.7 % (ref 11.5–15.5)
WBC: 11.8 10*3/uL — ABNORMAL HIGH (ref 4.0–10.5)
nRBC: 0 % (ref 0.0–0.2)

## 2022-10-27 LAB — MAGNESIUM: Magnesium: 2 mg/dL (ref 1.7–2.4)

## 2022-10-27 MED ORDER — KCL-LACTATED RINGERS-D5W 20 MEQ/L IV SOLN
INTRAVENOUS | Status: DC
  Filled 2022-10-27 (×8): qty 1000

## 2022-10-27 NOTE — Progress Notes (Signed)
POD # 3 Rob gastric perf repair   Doing very well.  Minimal pain.  Ambulating AVSS Very hunfry Drain serus   PE NAD Abd: soft, incisions c/d/I   A/p Doing well Upper GI series Monday and remove ng + advance diet if study is good Continue Ng, PPI and a/bs  [employer]

## 2022-10-28 LAB — CBC
HCT: 28.9 % — ABNORMAL LOW (ref 36.0–46.0)
Hemoglobin: 9.3 g/dL — ABNORMAL LOW (ref 12.0–15.0)
MCH: 28.5 pg (ref 26.0–34.0)
MCHC: 32.2 g/dL (ref 30.0–36.0)
MCV: 88.7 fL (ref 80.0–100.0)
Platelets: 391 10*3/uL (ref 150–400)
RBC: 3.26 MIL/uL — ABNORMAL LOW (ref 3.87–5.11)
RDW: 14.7 % (ref 11.5–15.5)
WBC: 8.8 10*3/uL (ref 4.0–10.5)
nRBC: 0 % (ref 0.0–0.2)

## 2022-10-28 LAB — BASIC METABOLIC PANEL
Anion gap: 9 (ref 5–15)
BUN: 9 mg/dL (ref 8–23)
CO2: 23 mmol/L (ref 22–32)
Calcium: 8.1 mg/dL — ABNORMAL LOW (ref 8.9–10.3)
Chloride: 106 mmol/L (ref 98–111)
Creatinine, Ser: 0.72 mg/dL (ref 0.44–1.00)
GFR, Estimated: >60 mL/min (ref >60)
Glucose, Bld: 114 mg/dL — ABNORMAL HIGH (ref 70–99)
Potassium: 3.5 mmol/L (ref 3.5–5.1)
Sodium: 138 mmol/L (ref 135–145)

## 2022-10-28 LAB — MAGNESIUM: Magnesium: 1.6 mg/dL — ABNORMAL LOW (ref 1.7–2.4)

## 2022-10-28 MED ORDER — FAMOTIDINE IN NACL 20-0.9 MG/50ML-% IV SOLN
20.0000 mg | Freq: Two times a day (BID) | INTRAVENOUS | Status: DC
  Administered 2022-10-28 – 2022-10-30 (×5): 20 mg via INTRAVENOUS
  Filled 2022-10-28 (×7): qty 50

## 2022-10-28 MED ORDER — LORAZEPAM 2 MG/ML IJ SOLN
2.0000 mg | Freq: Four times a day (QID) | INTRAMUSCULAR | Status: DC | PRN
  Filled 2022-10-28: qty 1

## 2022-10-28 MED ORDER — HYDRALAZINE HCL 20 MG/ML IJ SOLN
10.0000 mg | INTRAMUSCULAR | Status: DC | PRN
  Administered 2022-10-29 (×2): 10 mg via INTRAVENOUS
  Filled 2022-10-28 (×2): qty 1

## 2022-10-28 MED ORDER — HYDRALAZINE HCL 20 MG/ML IJ SOLN
5.0000 mg | INTRAMUSCULAR | Status: DC | PRN
  Administered 2022-10-28: 5 mg via INTRAVENOUS
  Filled 2022-10-28: qty 1

## 2022-10-28 NOTE — Progress Notes (Signed)
   10/28/22 1259  Assess: MEWS Score  BP (!) 203/127  MAP (mmHg) 143  Pulse Rate 67  Resp 18  SpO2 100 %  O2 Device Room Air  Assess: MEWS Score  MEWS Temp 0  MEWS Systolic 2  MEWS Pulse 0  MEWS RR 0  MEWS LOC 0  MEWS Score 2  MEWS Score Color Yellow  Assess: if the MEWS score is Yellow or Red  Were vital signs taken at a resting state? Yes  Focused Assessment Change from prior assessment (see assessment flowsheet)  Does the patient meet 2 or more of the SIRS criteria? No  MEWS guidelines implemented *See Row Information* Yes  Treat  MEWS Interventions Administered prn meds/treatments (Dr. Dahlia Byes notified of elevated BP, PRN hydralazine given)  Take Vital Signs  Increase Vital Sign Frequency  Yellow: Q 2hr X 2 then Q 4hr X 2, if remains yellow, continue Q 4hrs  Escalate  MEWS: Escalate Yellow: discuss with charge nurse/RN and consider discussing with provider and RRT  Notify: Charge Nurse/RN  Name of Charge Nurse/RN Notified Alejandra RN  Date Charge Nurse/RN Notified 10/28/22  Time Charge Nurse/RN Notified 1259  Provider Notification  Provider Name/Title Dr. Dahlia Byes  Date Provider Notified 10/28/22  Time Provider Notified 1209  Method of Notification Face-to-face  Notification Reason Change in status  Provider response At bedside  Document  Patient Outcome Other (Comment) (PRN Hydralazine given)  Assess: SIRS CRITERIA  SIRS Temperature  0  SIRS Pulse 0  SIRS Respirations  0  SIRS WBC 0  SIRS Score Sum  0

## 2022-10-28 NOTE — Progress Notes (Signed)
Dr. Dahlia Byes at bedside, notified of elevated BP, 176/104 per MD will order PRN hydralazine. Will give and continue to monitor.

## 2022-10-28 NOTE — Progress Notes (Signed)
CC: G perforation Subjective: No complaints, , she is hypertensive Wbc nml  Objective: Vital signs in last 24 hours: Temp:  [98 F (36.7 C)-99.8 F (37.7 C)] 98.1 F (36.7 C) (01/21 1608) Pulse Rate:  [65-87] 87 (01/21 1608) Resp:  [18-20] 18 (01/21 1608) BP: (137-203)/(91-127) 137/96 (01/21 1608) SpO2:  [93 %-100 %] 100 % (01/21 1608) Last BM Date : 10/23/22  Intake/Output from previous day: 01/20 0701 - 01/21 0700 In: 1387.5 [I.V.:879.6; NG/GT:150; IV Piggyback:357.9] Out: 385 [Emesis/NG output:325; Drains:60] Intake/Output this shift: Total I/O In: 30 [NG/GT:30] Out: 0   Physical exam:   PE NAD Abd: soft, incisions c/d/I  Lab Results: CBC  Recent Labs    10/27/22 0346 10/28/22 0454  WBC 11.8* 8.8  HGB 8.1* 9.3*  HCT 25.4* 28.9*  PLT 332 391   BMET Recent Labs    10/27/22 0346 10/28/22 0454  NA 138 138  K 3.2* 3.5  CL 109 106  CO2 19* 23  GLUCOSE 65* 114*  BUN 17 9  CREATININE 0.86 0.72  CALCIUM 7.7* 8.1*   PT/INR No results for input(s): "LABPROT", "INR" in the last 72 hours. ABG No results for input(s): "PHART", "HCO3" in the last 72 hours.  Invalid input(s): "PCO2", "PO2"  Studies/Results: No results found.  Anti-infectives: Anti-infectives (From admission, onward)    Start     Dose/Rate Route Frequency Ordered Stop   10/24/22 1600  metroNIDAZOLE (FLAGYL) IVPB 500 mg        500 mg 100 mL/hr over 60 Minutes Intravenous Every 8 hours 10/24/22 1534     10/24/22 1245  cefTRIAXone (ROCEPHIN) 1 g in sodium chloride 0.9 % 100 mL IVPB        1 g 200 mL/hr over 30 Minutes Intravenous Every 24 hours 10/24/22 1236     10/24/22 1245  metroNIDAZOLE (FLAGYL) IVPB 500 mg  Status:  Discontinued        500 mg 100 mL/hr over 60 Minutes Intravenous Every 8 hours 10/24/22 1236 10/24/22 1534   10/24/22 0845  cefTRIAXone (ROCEPHIN) 1 g in sodium chloride 0.9 % 100 mL IVPB        1 g 200 mL/hr over 30 Minutes Intravenous  Once 10/24/22 0842 10/24/22 0949    10/24/22 0845  metroNIDAZOLE (FLAGYL) IVPB 500 mg        500 mg 100 mL/hr over 60 Minutes Intravenous  Once 10/24/22 0842 10/24/22 1015       Assessment/Plan:  HTN added hydralazine no evidence of organ damage Swallow tomorrow and DC ng if swallow is ok  Caroleen Hamman, MD, FACS  10/28/2022

## 2022-10-28 NOTE — Plan of Care (Signed)

## 2022-10-29 ENCOUNTER — Inpatient Hospital Stay: Payer: Medicaid Other

## 2022-10-29 LAB — BASIC METABOLIC PANEL
Anion gap: 9 (ref 5–15)
Anion gap: 12 (ref 5–15)
BUN: <5 mg/dL — ABNORMAL LOW (ref 8–23)
BUN: <5 mg/dL — ABNORMAL LOW (ref 8–23)
CO2: 25 mmol/L (ref 22–32)
CO2: 21 mmol/L — ABNORMAL LOW (ref 22–32)
Calcium: 7.6 mg/dL — ABNORMAL LOW (ref 8.9–10.3)
Calcium: 8.3 mg/dL — ABNORMAL LOW (ref 8.9–10.3)
Chloride: 104 mmol/L (ref 98–111)
Chloride: 105 mmol/L (ref 98–111)
Creatinine, Ser: 0.53 mg/dL (ref 0.44–1.00)
Creatinine, Ser: 0.85 mg/dL (ref 0.44–1.00)
GFR, Estimated: >60 mL/min (ref >60)
GFR, Estimated: >60 mL/min (ref >60)
Glucose, Bld: 115 mg/dL — ABNORMAL HIGH (ref 70–99)
Glucose, Bld: 188 mg/dL — ABNORMAL HIGH (ref 70–99)
Potassium: 2.7 mmol/L — CL (ref 3.5–5.1)
Potassium: 3.5 mmol/L (ref 3.5–5.1)
Sodium: 138 mmol/L (ref 135–145)
Sodium: 138 mmol/L (ref 135–145)

## 2022-10-29 LAB — CBC
HCT: 27.4 % — ABNORMAL LOW (ref 36.0–46.0)
Hemoglobin: 9 g/dL — ABNORMAL LOW (ref 12.0–15.0)
MCH: 28.4 pg (ref 26.0–34.0)
MCHC: 32.8 g/dL (ref 30.0–36.0)
MCV: 86.4 fL (ref 80.0–100.0)
Platelets: 393 10*3/uL (ref 150–400)
RBC: 3.17 MIL/uL — ABNORMAL LOW (ref 3.87–5.11)
RDW: 14.4 % (ref 11.5–15.5)
WBC: 5.9 10*3/uL (ref 4.0–10.5)
nRBC: 0 % (ref 0.0–0.2)

## 2022-10-29 LAB — MAGNESIUM: Magnesium: 1.4 mg/dL — ABNORMAL LOW (ref 1.7–2.4)

## 2022-10-29 MED ORDER — POTASSIUM CHLORIDE 10 MEQ/100ML IV SOLN
10.0000 meq | INTRAVENOUS | Status: DC
  Administered 2022-10-29: 10 meq via INTRAVENOUS
  Filled 2022-10-29: qty 100

## 2022-10-29 MED ORDER — MAGNESIUM SULFATE 2 GM/50ML IV SOLN
2.0000 g | Freq: Once | INTRAVENOUS | Status: AC
  Administered 2022-10-29: 2 g via INTRAVENOUS
  Filled 2022-10-29: qty 50

## 2022-10-29 MED ORDER — POTASSIUM CHLORIDE 20 MEQ PO PACK
20.0000 meq | PACK | Freq: Once | ORAL | Status: AC
  Administered 2022-10-29: 20 meq
  Filled 2022-10-29: qty 1

## 2022-10-29 MED ORDER — IOHEXOL 300 MG/ML  SOLN
30.0000 mL | Freq: Once | INTRAMUSCULAR | Status: AC | PRN
  Administered 2022-10-29: 30 mL via ORAL

## 2022-10-29 MED ORDER — IOHEXOL 300 MG/ML  SOLN
300.0000 mL | Freq: Once | INTRAMUSCULAR | Status: AC | PRN
  Administered 2022-10-29: 300 mL via ORAL

## 2022-10-29 NOTE — Plan of Care (Signed)

## 2022-10-29 NOTE — Progress Notes (Signed)
Patient has a critical potassium of 2.7. MD notified.

## 2022-10-29 NOTE — Progress Notes (Addendum)
[facility] Day(s): 5.   Post op day(s): 5 Days Post-Op.   Interval History:  Patient seen and examined Complains about JP drain being wet.   No fever, chills, nausea, emesis Leukocytosis normal x 2  Drain with 20 ccs; clearing serous fluid She is NPO Continues on Rocephin + Flagyl   Vital signs in last 24 hours: [min-max] current  Temp:  [98.1 F (36.7 C)-98.7 F (37.1 C)] 98.2 F (36.8 C) (01/22 0817) Pulse Rate:  [67-97] 97 (01/22 0817) Resp:  [18-20] 20 (01/22 0817) BP: (137-203)/(91-127) 160/111 (01/22 0817) SpO2:  [95 %-100 %] 100 % (01/22 0817)     Height: 5\' 3"  (160 cm) Weight: 54.4 kg BMI (Calculated): 21.26   Intake/Output last 2 shifts:  01/21 0701 - 01/22 0700 In: 60 [NG/GT:60] Out: 220 [Emesis/NG output:200; Drains:20]   Physical Exam:  Constitutional: alert, cooperative and no distress  HEENT: NGT in place; minimal output in canister; this appears to be clearing Respiratory: breathing non-labored at rest  Cardiovascular: regular rate and sinus rhythm  Gastrointestinal: soft, non-tender, and non-distended. No rebound/guarding. Surgical drain present; output murky serous fluid Integumentary: Laparoscopic incisions are CDI with dermabond, no erythema or drainage  Labs:     Latest Ref Rng & Units 10/29/2022    4:25 AM 10/28/2022    4:54 AM 10/27/2022    3:46 AM  CBC  WBC 4.0 - 10.5 K/uL 5.9  8.8  11.8   Hemoglobin 12.0 - 15.0 g/dL 9.0  9.3  8.1   Hematocrit 36.0 - 46.0 % 27.4  28.9  25.4   Platelets 150 - 400 K/uL 393  391  332       Latest Ref Rng & Units 10/29/2022    4:25 AM 10/28/2022    4:54 AM 10/27/2022    3:46 AM  CMP  Glucose 70 - 99 mg/dL 115  114  65   BUN 8 - 23 mg/dL 5  9  17    Creatinine 0.44 - 1.00 mg/dL 0.53  0.72  0.86   Sodium 135 - 145 mmol/L 138  138  138   Potassium 3.5 - 5.1 mmol/L 2.7  3.5  3.2   Chloride 98 - 111 mmol/L 104  106  109   CO2 22 - 32 mmol/L 25  23  19    Calcium  8.9 - 10.3 mg/dL 7.6  8.1  7.7      Imaging studies:  CLINICAL DATA:  Patient with anterior gastric perforation status post Graham patch repair 10/24/2022. Request received today for postoperative evaluation for possible leak   EXAM: DG UGI W SINGLE CM   TECHNIQUE: Single contrast examination was then performed using Omnipaque 300. This exam was performed by Reatha Armour, PA-C , and was supervised and interpreted by Dr Zetta Bills.   FLUOROSCOPY: Radiation Exposure Index (as provided by the fluoroscopic device): 50.90 mGy Kerma   COMPARISON:  CT abdomen/pelvis with contrast 10/24/2022   FINDINGS: Esophagus:  Normal appearance.   Esophageal motility:  Within normal limits.   Gastroesophageal reflux: Mild reflux when patient was placed in the prone position   Ingested 46mm barium tablet: Not given   Stomach: Normal appearance. No hiatal hernia. No evidence of contrast leak.   Gastric emptying: Normal.   Duodenum: Mild irregularity in the antropyloric region of the stomach and proximal duodenum felt to be related to edema or other sequelae of Graham patch repair of gastric perforation. No evidence of contrast leak.  Other:  None.   IMPRESSION: Mild irregularity of the area of the distal stomach and proximal duodenum felt to represent postoperative edema and or sequela of recent [facility] patch. No signs of contrast leak.     Electronically Signed   By: Zetta Bills M.D.   On: 10/29/2022 13:24   Assessment/Plan:  62 y.o. female 5 Days Post-Op s/p robotic assisted repair of gastric perforation with Phillip Heal patch.   -Discontinue NG tube and begin clear liquid diet. - Continue IV Abx (Ceftriaxone, Flagyl); - Continue IV PPI; will need this PO at DC - Continue surgical drain; monitor and record output  -Anemia, replenish with K riders, may switch to p.o. if additional potassium needed.  Follow-up BMP tonight, and tomorrow morning. -Appreciate pharmacy's  assistance for hypomagnesemia  - Pain control prn; NO NSAIDs - Antiemetics prn - Monitor BMP in response to Kcl riders.   - Okay to mobilize; engage PT if needed    All of the above findings and recommendations were discussed with the patient, and the medical team, and all of patient's questions were answered to her expressed satisfaction.  Ronny Bacon, M.D., [facility]  10/29/2022 ; 10:16 AM

## 2022-10-29 NOTE — Progress Notes (Signed)
NGT removed per MD order, pt tolerated well. Pt complaining of potassium burning. Rate lowered from 100 to 50 cc/hr. Pt still complaining of burning and upset because she said its not supposed to be burning and pt screaming " you are doing something wrong, you need to leave my room" Charge nurse Lawanda aware.

## 2022-10-30 LAB — BASIC METABOLIC PANEL
Anion gap: 7 (ref 5–15)
BUN: <5 mg/dL — ABNORMAL LOW (ref 8–23)
CO2: 24 mmol/L (ref 22–32)
Calcium: 7.8 mg/dL — ABNORMAL LOW (ref 8.9–10.3)
Chloride: 105 mmol/L (ref 98–111)
Creatinine, Ser: 0.73 mg/dL (ref 0.44–1.00)
GFR, Estimated: >60 mL/min (ref >60)
Glucose, Bld: 125 mg/dL — ABNORMAL HIGH (ref 70–99)
Potassium: 3.4 mmol/L — ABNORMAL LOW (ref 3.5–5.1)
Sodium: 136 mmol/L (ref 135–145)

## 2022-10-30 MED ORDER — ACETAMINOPHEN 325 MG PO TABS
650.0000 mg | ORAL_TABLET | Freq: Four times a day (QID) | ORAL | Status: DC | PRN
  Administered 2022-10-31: 650 mg via ORAL
  Filled 2022-10-30: qty 2

## 2022-10-30 MED ORDER — POTASSIUM CHLORIDE CRYS ER 20 MEQ PO TBCR
20.0000 meq | EXTENDED_RELEASE_TABLET | Freq: Two times a day (BID) | ORAL | Status: DC
  Administered 2022-10-30 – 2022-10-31 (×3): 20 meq via ORAL
  Filled 2022-10-30 (×3): qty 1

## 2022-10-30 MED ORDER — HYDROCODONE-ACETAMINOPHEN 5-325 MG PO TABS
1.0000 | ORAL_TABLET | Freq: Four times a day (QID) | ORAL | Status: DC | PRN
  Administered 2022-10-30: 2 via ORAL
  Filled 2022-10-30: qty 2

## 2022-10-30 NOTE — Progress Notes (Signed)
Patient alert and oriented x4. Denied pain. Had a bowel movement at shift change last night. Gets up to [facility] by herself w/o issues. JP drain put out 70 mls of serous fluid throughout the night. Split gauze on site was saturated with serous fluid-gauze changed and silk tape applied on top. Patient slept well. Additional needs denied.

## 2022-10-30 NOTE — Progress Notes (Signed)
[place] Hospital Day(s): 6.   Post op day(s): 6 Days Post-Op.   Interval History:  Patient seen and examined No acute events or new complaints overnight.  Patient reports she is doing great; very thankful No fever, chills, nausea, emesis Mild hypokalemia to 3.4 Renal function normal; sCr - 0.73; UO - unmeasured Drain with 100 ccs out; serous Continues on Ceftriaxone/Flagyl Now on CLD; tolerating Having bowel function   Vital signs in last 24 hours: [min-max] current  Temp:  [98 F (36.7 C)-98.4 F (36.9 C)] 98.2 F (36.8 C) (01/23 0431) Pulse Rate:  [83-109] 83 (01/23 0431) Resp:  [16-20] 16 (01/23 0431) BP: (119-160)/(72-111) 158/95 (01/23 0431) SpO2:  [99 %-100 %] 99 % (01/23 0431)     Height: 5\' 3"  (160 cm) Weight: 54.4 kg BMI (Calculated): 21.26   Intake/Output last 2 shifts:  01/22 0701 - 01/23 0700 In: 30 [NG/GT:30] Out: 500 [Drains:100; Stool:400]   Physical Exam:  Constitutional: alert, cooperative and no distress  Respiratory: breathing non-labored at rest  Cardiovascular: regular rate and sinus rhythm  Gastrointestinal: soft, non-tender, and non-distended. No rebound/guarding. Surgical drain present; output serous Integumentary: Laparoscopic incisions are CDI with dermabond, no erythema or drainage  Labs:     Latest Ref Rng & Units 10/29/2022    4:25 AM 10/28/2022    4:54 AM 10/27/2022    3:46 AM  CBC  WBC 4.0 - 10.5 K/uL 5.9  8.8  11.8   Hemoglobin 12.0 - 15.0 g/dL 9.0  9.3  8.1   Hematocrit 36.0 - 46.0 % 27.4  28.9  25.4   Platelets 150 - 400 K/uL 393  391  332       Latest Ref Rng & Units 10/30/2022    5:08 AM 10/29/2022    5:02 PM 10/29/2022    4:25 AM  CMP  Glucose 70 - 99 mg/dL 125  188  115   BUN 8 - 23 mg/dL <5  <5  <5   Creatinine 0.44 - 1.00 mg/dL 0.73  0.85  0.53   Sodium 135 - 145 mmol/L 136  138  138   Potassium 3.5 - 5.1 mmol/L 3.4  3.5  2.7   Chloride 98 - 111 mmol/L 105  105  104   CO2 22  - 32 mmol/L 24  21  25    Calcium 8.9 - 10.3 mg/dL 7.8  8.3  7.6      Imaging studies: No new pertinent imaging studies   Assessment/Plan:  62 y.o. female 6 Days Post-Op s/p robotic assisted repair of gastric perforation with Phillip Heal patch.   - Okay to advance to FLD - DC IVF Resuscitation - Replete K+; switch to PO - Continue IV Abx (Ceftriaxone, Flagyl); Day 6/7 - Continue IV PPI; will need this PO at DC - Monitor abdominal examination; on-going bowel function - Continue surgical drain; monitor and record output - suspect we can DC this before discharge              - Pain control prn; NO NSAIDs - Antiemetics prn             - Okay to mobilize   - Discharge Planning: Tolerating diet; advancing. Suspect we will be able to DC home in next 24-48 hours  All of the above findings and recommendations were discussed with the patient, and the medical team, and all of patient's questions were answered to her expressed satisfaction.  -- Edison Simon, PA-C   [facility] 10/30/2022, 7:06 AM M-F: 7am - 4pm

## 2022-10-31 MED ORDER — HYDROCODONE-ACETAMINOPHEN 5-325 MG PO TABS: 1.0000 | ORAL_TABLET | Freq: Four times a day (QID) | ORAL | 0 refills | Status: DC | PRN

## 2022-10-31 MED ORDER — OMEPRAZOLE 40 MG PO CPDR: 40.0000 mg | DELAYED_RELEASE_CAPSULE | Freq: Two times a day (BID) | ORAL | 1 refills | Status: DC

## 2022-10-31 NOTE — Discharge Summary (Signed)
[facility] SURGICAL DISCHARGE SUMMARY  Patient ID: Robin Mckinney MRN: [id number] DOB/AGE: [birth year] 62 y.o.  Admit date: 10/24/2022 Discharge date: 10/31/2022  Discharge Diagnoses Patient Active Problem List   Diagnosis Date Noted   Gastric perforation ([place]) 10/24/2022   Acute gastric ulcer with perforation ([place]) 10/24/2022    Consultants None  Procedures 10/24/2022: Robotic assisted repair of gastric perforation with Phillip Heal patch  HPI: 62 y.o. female presented to [facility] ED today for abdominal pain. Patient with recent admission for nausea/emesis thought to be secondary to gastroenteritis vs partial SBO. She ultimately did very well, never required NGT placement, diet advanced and was discharged home on 1/15. She unfortunately had return of severe upper abdominal pain suddenly last night. She denied acute inciting events. Did have some nausea. No fever, chills, emesis, CP< SOB, urinary changes or bowel changes. This pain is much different than her presenting pain from last week.  Work up in the ED revealed normal WBC at 9.3K, she did have AKI with sCr - 1.54, mild hypokalemia to 3.4. CT Abdomen/Pelvis was obtained and concerning for likely perforated gastric ulcer.   Hospital Course: Informed consent was obtained and documented, and patient underwent uneventful robotic assisted repair of gastric perforation with Phillip Heal patch (Dr Christian Mate, 10/24/2022).  Post-operatively, patient did exceptionally well. She underwent UGI on POD5 which showed intact repair without leak. NGT was removed and diet started and advanced as tolerated. Ambulation was well-tolerated. The remainder of patient's hospital course was essentially unremarkable, and discharge planning was initiated accordingly with patient safely able to be discharged home with appropriate discharge instructions, pain control, and outpatient follow-up after all of her questions were answered to her expressed  satisfaction.   Discharge Condition: Good   Physical Examination:  Constitutional: alert, cooperative and no distress  Respiratory: breathing non-labored at rest  Cardiovascular: regular rate and sinus rhythm  Gastrointestinal: soft, non-tender, and non-distended. No rebound/guarding. Surgical drain present; output serous (Removed) Integumentary: Laparoscopic incisions are CDI with dermabond, no erythema or drainage   Allergies as of 10/31/2022   No Known Allergies      Medication List     STOP taking these medications    naproxen 500 MG tablet Commonly known as: Naprosyn       TAKE these medications    aspirin EC 81 MG tablet Take 1 tablet (81 mg total) by mouth daily.   calcium carbonate 500 MG chewable tablet Commonly known as: TUMS - dosed in mg elemental calcium Chew 1-2 tablets (200-400 mg of elemental calcium total) by mouth 3 (three) times daily as needed for indigestion or heartburn.   famotidine 20 MG tablet Commonly known as: PEPCID Take 1 tablet (20 mg total) by mouth 2 (two) times daily.   HYDROcodone-acetaminophen 5-325 MG tablet Commonly known as: NORCO/VICODIN Take 1 tablet by mouth every 6 (six) hours as needed for moderate pain or severe pain.   lisinopril-hydrochlorothiazide 10-12.5 MG tablet Commonly known as: ZESTORETIC Take 1 tablet by mouth daily.   nicotine 21 mg/24hr patch Commonly known as: NICODERM CQ - dosed in mg/24 hours Place 1 patch (21 mg total) onto the skin daily.   omeprazole 40 MG capsule Commonly known as: PRILOSEC Take 1 capsule (40 mg total) by mouth in the morning and at bedtime.   polyethylene glycol 17 g packet Commonly known as: MIRALAX / GLYCOLAX Take 17 g by mouth daily.          Follow-up Information     Ronny Bacon, MD.  Schedule an appointment as soon as possible for a visit in 3 week(s).   Specialty: General Surgery Why: s/p robotic assisted repair of gastric perforation Contact  information: [address] [place] [address] [phone]                  Time spent on discharge management including discussion of hospital course, clinical condition, outpatient instructions, prescriptions, and follow up with the patient and members of the medical team: >30 minutes  -- Edison Simon , PA-C [facility]  10/31/2022, 9:00 AM [phone] M-F: 7am - 4pm

## 2022-10-31 NOTE — Discharge Instructions (Signed)
In addition to included general post-operative instructions,  Diet: Resume home diet.   Activity: No heavy lifting >20 pounds (children, pets, laundry, garbage) or strenuous activity for 4 weeks, but light activity and walking are encouraged. Do not drive or drink alcohol if taking narcotic pain medications or having pain that might distract from driving.  Wound care: You may shower/get incision wet with soapy water and pat dry (do not rub incisions), but no baths or submerging incision underwater until follow-up.   Medications: Resume all home medications. Avoid taking NSAIDs (ex: Motrin, Aleve), BC powders, alcohol.   Call office [phone] / [phone]) at any time if any questions, worsening pain, fevers/chills, bleeding, drainage from incision site, or other concerns.

## 2022-10-31 NOTE — Progress Notes (Signed)
Discharge instructions reviewed with patient including followup visits and new medications.  Understanding was verbalized and all questions were answered.  IV removed without complication; patient tolerated well.  Patient discharged home via wheelchair in stable condition escorted by volunteer staff.

## 2022-11-22 ENCOUNTER — Encounter: Payer: Self-pay | Admitting: Surgery

## 2022-11-22 ENCOUNTER — Ambulatory Visit (INDEPENDENT_AMBULATORY_CARE_PROVIDER_SITE_OTHER): Payer: Medicaid Other | Admitting: Surgery

## 2022-11-22 VITALS — BP 120/78 | HR 81 | Temp 98.2°F | Ht 63.0 in | Wt 128.6 lb

## 2022-11-22 DIAGNOSIS — K251 Acute gastric ulcer with perforation: Secondary | ICD-10-CM

## 2022-11-22 DIAGNOSIS — K255 Chronic or unspecified gastric ulcer with perforation: Secondary | ICD-10-CM

## 2022-11-22 NOTE — Progress Notes (Signed)
[facility] POST-OP OFFICE VISIT  11/22/2022  HPI: Robin Mckinney is a 62 y.o. female 4 weeks s/p robotic assisted Graham patch repair of perforated gastric ulcer.  She denies any nausea, vomiting, fevers or chills.  She denies pain.  She reports normal bowel activity.  Vital signs: BP 120/78   Pulse 81   Temp 98.2 F (36.8 C) (Oral)   Ht 5' 3"$  (1.6 m)   Wt 128 lb 9.6 oz (58.3 kg)   SpO2 95%   BMI 22.78 kg/m    Physical Exam: Constitutional: Appears at baseline. Abdomen: Benign soft nontender nondistended. Skin: Prior incisions still have some residual Dermabond present.  Assessment/Plan: This is a 62 y.o. female 4 weeks s/p robotic assisted repair of perforated gastric ulcer.  Suspected secondary to utilization/overutilization of NSAIDs.  Patient Active Problem List   Diagnosis Date Noted   Gastric perforation ([place]) 10/24/2022   Acute gastric ulcer with perforation ([place]) 10/24/2022   Partial small bowel obstruction ([place]) 10/19/2022   Numbness and tingling of left hand 10/18/2017   Smoker 10/18/2017   Coronary artery calcification 10/18/2017   Chest pain 10/17/2017   Benign essential HTN 10/17/2017    -We discussed avoidance of NSAIDs, continuance of the PPI, and follow-up referral for gastroenterology for EGD. She also was reassured that she can shower at this point in time.  And anticipate the Dermabond coming off little by little with liberal bathing under water.   Ronny Bacon M.D., FACS 11/22/2022, 11:19 AM

## 2022-11-22 NOTE — Patient Instructions (Addendum)
A referral has been placed with [facility]. They will call you for an appointment.   If you have any concerns or questions, please feel free to call our office. Follow up as needed.

## 2022-12-18 ENCOUNTER — Ambulatory Visit: Payer: Medicaid Other | Admitting: Surgery

## 2022-12-18 NOTE — Progress Notes (Deleted)
[facility] POST-OP OFFICE VISIT  12/18/2022  HPI: Robin Mckinney is a 62 y.o. female 8 weeks s/p robotic assisted Graham patch repair of perforated gastric ulcer.  She denies any nausea, vomiting, fevers or chills.  She denies pain.  She reports normal bowel activity.  Vital signs: There were no vitals taken for this visit.   Physical Exam: Constitutional: Appears at baseline. Abdomen: Benign soft nontender nondistended. Skin: Prior incisions still have some residual Dermabond present.  Assessment/Plan: This is a 62 y.o. female 8 weeks s/p robotic assisted repair of perforated gastric ulcer.  Suspected secondary to utilization/overutilization of NSAIDs.  Patient Active Problem List   Diagnosis Date Noted   Numbness and tingling of left hand 10/18/2017   Smoker 10/18/2017   Coronary artery calcification 10/18/2017   Chest pain 10/17/2017   Benign essential HTN 10/17/2017    -We discussed avoidance of NSAIDs, continuance of the PPI, and follow-up referral for gastroenterology for EGD.    Ronny Bacon M.D., FACS 12/18/2022, 1:24 PM

## 2023-02-19 DIAGNOSIS — Z0130 Encounter for examination of blood pressure without abnormal findings: Secondary | ICD-10-CM | POA: Diagnosis not present

## 2023-02-19 DIAGNOSIS — H66009 Acute suppurative otitis media without spontaneous rupture of ear drum, unspecified ear: Secondary | ICD-10-CM | POA: Diagnosis not present

## 2023-02-19 DIAGNOSIS — Z1389 Encounter for screening for other disorder: Secondary | ICD-10-CM | POA: Diagnosis not present

## 2023-02-19 NOTE — Congregational Nurse Program (Signed)
  Dept: [phone]   Congregational Nurse Program Note  Date of Encounter: 02/19/2023 Client to [facility] day center with complaints of right ear pain and congestion as well as sore throat. Assisted client with making an appointment at her PCP office., [facility]. Apt obtained for today at 3:20. She reports she will have transportation to this appointment. Francesco Runner BSN, RN Past Medical History: Past Medical History:  Diagnosis Date   Acute gastric ulcer with perforation (HCC) 10/24/2022   CAD (coronary artery disease)    GERD (gastroesophageal reflux disease)    H/O cesarean section    Hypertension    Tobacco abuse     Encounter Details:  CNP Questionnaire - 02/19/23 1322       Questionnaire   Ask client: Do you give verbal consent for me to treat you today? Yes    Student Assistance N/A    Location Patient Served  [facility]    Visit Setting with Client Organization    Patient Status Unknown   client does have a home   Insurance Medicaid   [employer]   Insurance/Financial Assistance Referral N/A    Medication N/A    Medical Provider Yes   [facility]   Screening Referrals Made N/A    Medical Referrals Made N/A    Medical Appointment Made Non-[facility] PCP/clinic   assisted client with making an apt at [facility], s/s of ear infection/sinus infection   Recently w/o PCP, now 1st time PCP visit completed due to CNs referral or appointment made N/A    Food N/A    Transportation N/A    Housing/Utilities N/A    Interpersonal Safety N/A    Interventions Advocate/Support;Navigate Healthcare System    Abnormal to Normal Screening Since Last CN Visit N/A    Screenings CN Performed N/A    Sent Client to Lab for: N/A    Did client attend any of the following based off CNs referral or appointments made? N/A    ED Visit Averted N/A    Life-Saving Intervention Made N/A

## 2023-03-05 ENCOUNTER — Other Ambulatory Visit: Payer: Self-pay

## 2023-03-05 ENCOUNTER — Ambulatory Visit: Payer: Medicaid Other | Admitting: Gastroenterology

## 2023-03-05 NOTE — Progress Notes (Deleted)
Wyline Mood MD, MRCP(U.K) [address]  [address]  [place], [place] [address]  Main: [phone]  Fax: [phone]   Gastroenterology Consultation  Referring Provider: Dr Claudine Mouton Primary Care Physician:  [facility] Primary Gastroenterologist:  Dr. Wyline Mood  Reason for Consultation:     Gastric perforation         HPI:   Robin Mckinney is a 62 y.o. y/o female referred for consultation & management  by [facility].     10/24/2022: Robotic assisted graham patch repair for perforated gastric ulcer when he was admitted in 10/2022 with abdominal pain , 10/24/2022: Ct abdomen showed pneumoperitoneum.  Past Medical History:  Diagnosis Date   Acute gastric ulcer with perforation (HCC) 10/24/2022   CAD (coronary artery disease)    GERD (gastroesophageal reflux disease)    H/O cesarean section    Hypertension    Tobacco abuse     Past Surgical History:  Procedure Laterality Date   CESAREAN SECTION      Prior to Admission medications   Medication Sig Start Date End Date Taking? Authorizing Provider  aspirin EC 81 MG tablet Take 1 tablet (81 mg total) by mouth daily. 10/22/22   Sunnie Nielsen, DO  calcium carbonate (TUMS - DOSED IN MG ELEMENTAL CALCIUM) 500 MG chewable tablet Chew 1-2 tablets (200-400 mg of elemental calcium total) by mouth 3 (three) times daily as needed for indigestion or heartburn. 10/22/22   Sunnie Nielsen, DO  famotidine (PEPCID) 20 MG tablet Take 1 tablet (20 mg total) by mouth 2 (two) times daily. 10/22/22   Sunnie Nielsen, DO  HYDROcodone-acetaminophen (NORCO/VICODIN) 5-325 MG tablet Take 1 tablet by mouth every 6 (six) hours as needed for moderate pain or severe pain. 10/31/22   Donovan Kail, PA-C  lisinopril-hydrochlorothiazide (ZESTORETIC) 20-25 MG tablet Take 1 tablet by mouth daily. 11/08/22   [provider]  nicotine (NICODERM CQ - DOSED IN MG/24 HOURS) 21 mg/24hr patch  Place 1 patch (21 mg total) onto the skin daily. 10/22/22   Sunnie Nielsen, DO  omeprazole (PRILOSEC) 40 MG capsule Take 1 capsule (40 mg total) by mouth in the morning and at bedtime. 10/31/22 12/30/22  Donovan Kail, PA-C  polyethylene glycol (MIRALAX / GLYCOLAX) 17 g packet Take 17 g by mouth daily. 10/22/22   Sunnie Nielsen, DO  Vitamin D, Ergocalciferol, (DRISDOL) 1.25 MG (50000 UNIT) CAPS capsule Take 50,000 Units by mouth every 7 (seven) days. 12/12/22   [provider]    Family History  Problem Relation Age of Onset   Hypertension Mother    Hypertension Father      Social History   Tobacco Use   Smoking status: Every Day    Packs/day: .5    Types: Cigarettes   Smokeless tobacco: Never  Substance Use Topics   Alcohol use: Yes   Drug use: No    Allergies as of 03/05/2023   (No Known Allergies)    Review of Systems:    All systems reviewed and negative except where noted in HPI.   Physical Exam:  There were no vitals taken for this visit. No LMP recorded. Patient is postmenopausal. Psych:  Alert and cooperative. Normal mood and affect. General:   Alert,  Well-developed, well-nourished, pleasant and cooperative in NAD Head:  Normocephalic and atraumatic. Eyes:  Sclera clear, no icterus.   Conjunctiva pink. Ears:  Normal auditory acuity. Neck:  Supple; no masses or thyromegaly. Lungs:  Respirations even and unlabored.  Clear throughout to auscultation.   No wheezes, crackles, or rhonchi. No acute distress. Heart:  Regular rate and rhythm; no murmurs, clicks, rubs, or gallops. Abdomen:  Normal bowel sounds.  No bruits.  Soft, non-tender and non-distended without masses, hepatosplenomegaly or hernias noted.  No guarding or rebound tenderness.    Neurologic:  Alert and oriented x3;  grossly normal neurologically. Psych:  Alert and cooperative. Normal mood and affect.  Imaging Studies: No results found.  Assessment and Plan:   [name] is a  62 y.o. y/o female has been referred for ***   Plan  EGD to confirm healing of ulcer No nsaids, smoking  H pylori breath test off PPI    Follow up in ***  Dr Wyline Mood MD,MRCP(U.K)    BP check ***

## 2023-03-21 ENCOUNTER — Observation Stay
Admission: EM | Admit: 2023-03-21 | Discharge: 2023-03-23 | Disposition: A | Discharge status: Home / Self Care | Payer: 59 | Attending: Student | Admitting: Student

## 2023-03-21 DIAGNOSIS — I10 Essential (primary) hypertension: Secondary | ICD-10-CM | POA: Diagnosis present

## 2023-03-21 DIAGNOSIS — R112 Nausea with vomiting, unspecified: Secondary | ICD-10-CM | POA: Diagnosis present

## 2023-03-21 DIAGNOSIS — D75839 Thrombocytosis, unspecified: Secondary | ICD-10-CM | POA: Diagnosis not present

## 2023-03-21 DIAGNOSIS — R109 Unspecified abdominal pain: Secondary | ICD-10-CM | POA: Diagnosis not present

## 2023-03-21 DIAGNOSIS — Z8249 Family history of ischemic heart disease and other diseases of the circulatory system: Secondary | ICD-10-CM

## 2023-03-21 DIAGNOSIS — E878 Other disorders of electrolyte and fluid balance, not elsewhere classified: Secondary | ICD-10-CM | POA: Diagnosis not present

## 2023-03-21 DIAGNOSIS — F1910 Other psychoactive substance abuse, uncomplicated: Secondary | ICD-10-CM | POA: Diagnosis not present

## 2023-03-21 DIAGNOSIS — E876 Hypokalemia: Secondary | ICD-10-CM | POA: Diagnosis not present

## 2023-03-21 DIAGNOSIS — D509 Iron deficiency anemia, unspecified: Secondary | ICD-10-CM | POA: Diagnosis present

## 2023-03-21 DIAGNOSIS — I160 Hypertensive urgency: Secondary | ICD-10-CM

## 2023-03-21 DIAGNOSIS — I4581 Long QT syndrome: Secondary | ICD-10-CM | POA: Diagnosis not present

## 2023-03-21 DIAGNOSIS — F1110 Opioid abuse, uncomplicated: Secondary | ICD-10-CM | POA: Diagnosis not present

## 2023-03-21 DIAGNOSIS — R9431 Abnormal electrocardiogram [ECG] [EKG]: Secondary | ICD-10-CM | POA: Diagnosis present

## 2023-03-21 DIAGNOSIS — I2510 Atherosclerotic heart disease of native coronary artery without angina pectoris: Secondary | ICD-10-CM | POA: Diagnosis present

## 2023-03-21 DIAGNOSIS — Z7982 Long term (current) use of aspirin: Secondary | ICD-10-CM | POA: Insufficient documentation

## 2023-03-21 DIAGNOSIS — K219 Gastro-esophageal reflux disease without esophagitis: Secondary | ICD-10-CM | POA: Diagnosis present

## 2023-03-21 DIAGNOSIS — I700 Atherosclerosis of aorta: Secondary | ICD-10-CM | POA: Diagnosis present

## 2023-03-21 DIAGNOSIS — Z8711 Personal history of peptic ulcer disease: Secondary | ICD-10-CM

## 2023-03-21 DIAGNOSIS — E860 Dehydration: Secondary | ICD-10-CM | POA: Diagnosis not present

## 2023-03-21 DIAGNOSIS — K529 Noninfective gastroenteritis and colitis, unspecified: Secondary | ICD-10-CM | POA: Insufficient documentation

## 2023-03-21 DIAGNOSIS — F17210 Nicotine dependence, cigarettes, uncomplicated: Secondary | ICD-10-CM | POA: Diagnosis present

## 2023-03-21 DIAGNOSIS — F1410 Cocaine abuse, uncomplicated: Secondary | ICD-10-CM | POA: Diagnosis present

## 2023-03-21 DIAGNOSIS — Z7151 Drug abuse counseling and surveillance of drug abuser: Secondary | ICD-10-CM

## 2023-03-21 DIAGNOSIS — R1110 Vomiting, unspecified: Secondary | ICD-10-CM | POA: Diagnosis not present

## 2023-03-21 DIAGNOSIS — Z79899 Other long term (current) drug therapy: Secondary | ICD-10-CM | POA: Insufficient documentation

## 2023-03-21 DIAGNOSIS — K2990 Gastroduodenitis, unspecified, without bleeding: Principal | ICD-10-CM

## 2023-03-21 DIAGNOSIS — Z716 Tobacco abuse counseling: Secondary | ICD-10-CM

## 2023-03-21 LAB — COMPREHENSIVE METABOLIC PANEL
ALT: 12 U/L (ref 0–44)
AST: 25 U/L (ref 15–41)
Albumin: 4.4 g/dL (ref 3.5–5.0)
Alkaline Phosphatase: 89 U/L (ref 38–126)
Anion gap: 14 (ref 5–15)
BUN: 12 mg/dL (ref 8–23)
CO2: 29 mmol/L (ref 22–32)
Calcium: 9.5 mg/dL (ref 8.9–10.3)
Chloride: 96 mmol/L — ABNORMAL LOW (ref 98–111)
Creatinine, Ser: 0.79 mg/dL (ref 0.44–1.00)
GFR, Estimated: >60 mL/min (ref >60)
Glucose, Bld: 129 mg/dL — ABNORMAL HIGH (ref 70–99)
Potassium: 2.8 mmol/L — ABNORMAL LOW (ref 3.5–5.1)
Sodium: 139 mmol/L (ref 135–145)
Total Bilirubin: 0.9 mg/dL (ref 0.3–1.2)
Total Protein: 8.7 g/dL — ABNORMAL HIGH (ref 6.5–8.1)

## 2023-03-21 LAB — CBC
HCT: 39.5 % (ref 36.0–46.0)
Hemoglobin: 12.1 g/dL (ref 12.0–15.0)
MCH: 23.5 pg — ABNORMAL LOW (ref 26.0–34.0)
MCHC: 30.6 g/dL (ref 30.0–36.0)
MCV: 76.8 fL — ABNORMAL LOW (ref 80.0–100.0)
Platelets: 430 10*3/uL — ABNORMAL HIGH (ref 150–400)
RBC: 5.14 MIL/uL — ABNORMAL HIGH (ref 3.87–5.11)
RDW: 16.8 % — ABNORMAL HIGH (ref 11.5–15.5)
WBC: 5.6 10*3/uL (ref 4.0–10.5)
nRBC: 0 % (ref 0.0–0.2)

## 2023-03-21 LAB — LIPASE, BLOOD: Lipase: 51 U/L (ref 11–51)

## 2023-03-21 LAB — TROPONIN I (HIGH SENSITIVITY): Troponin I (High Sensitivity): 6 ng/L (ref <18)

## 2023-03-21 MED ORDER — FAMOTIDINE IN NACL 20-0.9 MG/50ML-% IV SOLN
20.0000 mg | Freq: Once | INTRAVENOUS | Status: AC
  Administered 2023-03-22: 20 mg via INTRAVENOUS
  Filled 2023-03-21: qty 50

## 2023-03-21 MED ORDER — POTASSIUM CHLORIDE 10 MEQ/100ML IV SOLN
10.0000 meq | Freq: Once | INTRAVENOUS | Status: AC
  Administered 2023-03-22: 10 meq via INTRAVENOUS
  Filled 2023-03-21: qty 100

## 2023-03-21 MED ORDER — SODIUM CHLORIDE 0.9 % IV BOLUS
1000.0000 mL | Freq: Once | INTRAVENOUS | Status: AC
  Administered 2023-03-22: 1000 mL via INTRAVENOUS

## 2023-03-21 MED ORDER — ONDANSETRON HCL 4 MG/2ML IJ SOLN: 4.0000 mg | Freq: Once | INTRAMUSCULAR | Status: DC

## 2023-03-21 MED ORDER — HYDRALAZINE HCL 20 MG/ML IJ SOLN
10.0000 mg | Freq: Once | INTRAMUSCULAR | Status: AC
  Administered 2023-03-22: 10 mg via INTRAVENOUS
  Filled 2023-03-21: qty 1

## 2023-03-21 MED ORDER — DIPHENHYDRAMINE HCL 50 MG/ML IJ SOLN
25.0000 mg | Freq: Once | INTRAMUSCULAR | Status: AC
  Administered 2023-03-22: 25 mg via INTRAVENOUS
  Filled 2023-03-21: qty 1

## 2023-03-21 MED ORDER — MORPHINE SULFATE (PF) 2 MG/ML IV SOLN
2.0000 mg | Freq: Once | INTRAVENOUS | Status: AC
  Administered 2023-03-22: 2 mg via INTRAVENOUS
  Filled 2023-03-21: qty 1

## 2023-03-21 NOTE — ED Provider Notes (Incomplete)
[facility] Provider Note    Event Date/Time   First MD Initiated Contact with Patient 03/21/23 2326     (approximate)   History   N/V/D   HPI  Robin Mckinney is a 62 y.o. female who presents to the ED from home with a chief complaint of nausea/vomiting/diarrhea for the past 2 days.  Patient unable to tolerate PO clearing her medications.  States she last drink alcohol 1 month ago.  History of gastritis.  Denies fever/chills, chest pain, shortness of breath, dysuria or dizziness.     Past Medical History   Past Medical History:  Diagnosis Date  . Acute gastric ulcer with perforation (HCC) 10/24/2022  . CAD (coronary artery disease)   . GERD (gastroesophageal reflux disease)   . H/O cesarean section   . Hypertension   . Tobacco abuse      Active Problem List   Patient Active Problem List   Diagnosis Date Noted  . Pain, arm, left 09/17/2018  . Numbness and tingling of left hand 10/18/2017  . Smoker 10/18/2017  . Coronary artery calcification 10/18/2017  . Chest pain 10/17/2017  . Benign essential HTN 10/17/2017  . 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 Nicotine dependence, unspecified, uncomplicated 04/12/2015     Past Surgical History   Past Surgical History:  Procedure Laterality Date  . CESAREAN SECTION       Home Medications   Prior to Admission medications   Medication Sig Start Date End Date Taking? Authorizing Provider  aspirin EC 81 MG tablet Take 1 tablet (81 mg total) by mouth daily. 10/22/22   Sunnie Nielsen, DO  calcium carbonate (TUMS - DOSED IN MG ELEMENTAL CALCIUM) 500 MG chewable tablet Chew 1-2 tablets (200-400 mg of elemental calcium total) by mouth 3 (three) times daily as needed for indigestion or heartburn. 10/22/22   Sunnie Nielsen, DO  famotidine (PEPCID) 20 MG tablet Take 1 tablet (20 mg total) by mouth 2 (two) times daily. 10/22/22   Sunnie Nielsen, DO   HYDROcodone-acetaminophen (NORCO/VICODIN) 5-325 MG tablet Take 1 tablet by mouth every 6 (six) hours as needed for moderate pain or severe pain. 10/31/22   Donovan Kail, PA-C  lisinopril-hydrochlorothiazide (ZESTORETIC) 20-25 MG tablet Take 1 tablet by mouth daily. 11/08/22   [provider]  nicotine (NICODERM CQ - DOSED IN MG/24 HOURS) 21 mg/24hr patch Place 1 patch (21 mg total) onto the skin daily. 10/22/22   Sunnie Nielsen, DO  omeprazole (PRILOSEC) 40 MG capsule Take 1 capsule (40 mg total) by mouth in the morning and at bedtime. 10/31/22 12/30/22  Donovan Kail, PA-C  polyethylene glycol (MIRALAX / GLYCOLAX) 17 g packet Take 17 g by mouth daily. 10/22/22   Sunnie Nielsen, DO  Vitamin D, Ergocalciferol, (DRISDOL) 1.25 MG (50000 UNIT) CAPS capsule Take 50,000 Units by mouth every 7 (seven) days. 12/12/22   [provider]     Allergies  Patient has no known allergies.   Family History   Family History  Problem Relation Age of Onset  . Hypertension Mother   . Hypertension Father      Physical Exam  Triage Vital Signs: ED Triage Vitals  Enc Vitals Group     BP 03/21/23 2032 (!) 203/138     Pulse Rate 03/21/23 2032 90     Resp 03/21/23 2032 14     Temp 03/21/23 2032 98.6 F (37 C)     Temp Source 03/21/23 2032 Oral  SpO2 03/21/23 2032 100 %     Weight 03/21/23 2033 140 lb (63.5 kg)     Height 03/21/23 2033 5\' 3"  (1.6 m)     Head Circumference --      Peak Flow --      Pain Score 03/21/23 2032 0     Pain Loc --      Pain Edu? --      Excl. in GC? --     Updated Vital Signs: BP (!) 197/114 (BP Location: Right Arm)   Pulse 80   Temp 98.1 F (36.7 C) (Oral)   Resp 20   Ht 5\' 3"  (1.6 m)   Wt 63.5 kg   SpO2 100%   BMI 24.80 kg/m    General: Sleeping but arousable to voice, mild distress.  Mildly dry mucous membranes. CV:  RRR.  Good peripheral perfusion.  Resp:  Normal effort.  CTAB. Abd:  Mild diffuse tenderness to palpation  without rebound or guarding.  No distention.  Other:  No truncal vesicles.   ED Results / Procedures / Treatments  Labs (all labs ordered are listed, but only abnormal results are displayed) Labs Reviewed  COMPREHENSIVE METABOLIC PANEL - Abnormal; Notable for the following components:      Result Value   Potassium 2.8 (*)    Chloride 96 (*)    Glucose, Bld 129 (*)    Total Protein 8.7 (*)    All other components within normal limits  CBC - Abnormal; Notable for the following components:   RBC 5.14 (*)    MCV 76.8 (*)    MCH 23.5 (*)    RDW 16.8 (*)    Platelets 430 (*)    All other components within normal limits  LIPASE, BLOOD  URINALYSIS, ROUTINE W REFLEX MICROSCOPIC  TROPONIN I (HIGH SENSITIVITY)     EKG  ED ECG REPORT I, Fusaye Wachtel J, the attending physician, personally viewed and interpreted this ECG.   Date: 03/21/2023  EKG Time: 2042  Rate: 83  Rhythm: normal sinus rhythm  Axis: Normal  Intervals: QTC 553  ST&T Change: Nonspecific 10/24/2022 EKG QTc within normal limits   RADIOLOGY Independently visualized and interpreted patient's CT scan as well as noted the radiology interpretation:  CT head:  CT abdomen/pelvis:  Official radiology report(s): No results found.   PROCEDURES:  Critical Care performed: {CriticalCareYesNo:19197::"Yes, see critical care procedure note(s)","No"}  .1-3 Lead EKG Interpretation  Performed by: Irean Hong, MD Authorized by: Irean Hong, MD     Interpretation: normal     ECG rate:  85   ECG rate assessment: normal     Rhythm: sinus rhythm     Ectopy: none     Conduction: normal   Comments:     Patient placed on cardiac monitor to evaluate for arrhythmias    MEDICATIONS ORDERED IN ED: Medications  morphine (PF) 2 MG/ML injection 2 mg (has no administration in time range)  sodium chloride 0.9 % bolus 1,000 mL (has no administration in time range)  potassium chloride 10 mEq in 100 mL IVPB (has no administration  in time range)  potassium chloride 10 mEq in 100 mL IVPB (has no administration in time range)  hydrALAZINE (APRESOLINE) injection 10 mg (has no administration in time range)  famotidine (PEPCID) IVPB 20 mg premix (has no administration in time range)  diphenhydrAMINE (BENADRYL) injection 25 mg (has no administration in time range)     IMPRESSION / MDM / ASSESSMENT AND PLAN /  ED COURSE  I reviewed the triage vital signs and the nursing notes.                             62 year old female presenting with a several day history of nausea/vomiting/diarrhea, hypertensive due to not being able to tolerate her antihypertensives. Differential diagnosis includes, but is not limited to, ovarian cyst, ovarian torsion, acute appendicitis, diverticulitis, urinary tract infection/pyelonephritis, endometriosis, bowel obstruction, colitis, renal colic, gastroenteritis, hernia, etc. personally reviewed patient's records and note a surgery follow-up office visit on 11/22/2022 following gastric perforation status post surgery.  Patient's presentation is most consistent with acute presentation with potential threat to life or bodily function.  The patient is on the cardiac monitor to evaluate for evidence of arrhythmia and/or significant heart rate changes.  Laboratory results demonstrate normal WBC 5.6, normal hemoglobin 12.1, hypokalemia with potassium 2.8, troponin negative.  Will obtain CT head, abdomen/pelvis.  Awaiting urine sample.  Will initiate IV fluid resuscitation, IV potassium replacement, IV hydralazine for blood pressure control, IV Pepcid given patient's history of peptic ulcer.  Administer IV morphine for pain paired with Benadryl for nausea as patient has long QTc on her EKG tonight.  Will reassess.      FINAL CLINICAL IMPRESSION(S) / ED DIAGNOSES   Final diagnoses:  Nausea vomiting and diarrhea  Hypertension, unspecified type  Hypokalemia     Rx / DC Orders   ED Discharge Orders      None        Note:  This document was prepared using Dragon voice recognition software and may include unintentional dictation errors.

## 2023-03-21 NOTE — ED Provider Notes (Addendum)
[facility] Provider Note    Event Date/Time   First MD Initiated Contact with Patient 03/21/23 2326     (approximate)   History   N/V/D   HPI  Robin Mckinney is a 62 y.o. female who presents to the ED from home with a chief complaint of nausea/vomiting/diarrhea for the past 2 days.  Patient unable to tolerate PO clearing her medications.  States she last drink alcohol 1 month ago.  History of gastritis.  Denies fever/chills, chest pain, shortness of breath, dysuria or dizziness.     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 Authorizing Provider  aspirin EC 81 MG tablet Take 1 tablet (81 mg total) by mouth daily. 10/22/22   Sunnie Nielsen, DO  calcium carbonate (TUMS - DOSED IN MG ELEMENTAL CALCIUM) 500 MG chewable tablet Chew 1-2 tablets (200-400 mg of elemental calcium total) by mouth 3 (three) times daily as needed for indigestion or heartburn. 10/22/22   Sunnie Nielsen, DO  famotidine (PEPCID) 20 MG tablet Take 1 tablet (20 mg total) by mouth 2 (two) times daily. 10/22/22   Sunnie Nielsen, DO   HYDROcodone-acetaminophen (NORCO/VICODIN) 5-325 MG tablet Take 1 tablet by mouth every 6 (six) hours as needed for moderate pain or severe pain. 10/31/22   Donovan Kail, PA-C  lisinopril-hydrochlorothiazide (ZESTORETIC) 20-25 MG tablet Take 1 tablet by mouth daily. 11/08/22   [provider]  nicotine (NICODERM CQ - DOSED IN MG/24 HOURS) 21 mg/24hr patch Place 1 patch (21 mg total) onto the skin daily. 10/22/22   Sunnie Nielsen, DO  omeprazole (PRILOSEC) 40 MG capsule Take 1 capsule (40 mg total) by mouth in the morning and at bedtime. 10/31/22 12/30/22  Donovan Kail, PA-C  polyethylene glycol (MIRALAX / GLYCOLAX) 17 g packet Take 17 g by mouth daily. 10/22/22   Sunnie Nielsen, DO  Vitamin D, Ergocalciferol, (DRISDOL) 1.25 MG (50000 UNIT) CAPS capsule Take 50,000 Units by mouth every 7 (seven) days. 12/12/22   [provider]     Allergies  Patient has no known allergies.   Family History   Family History  Problem Relation Age of Onset   Hypertension Mother    Hypertension Father      Physical Exam  Triage Vital Signs: ED Triage Vitals  Enc Vitals Group     BP 03/21/23 2032 (!) 203/138     Pulse Rate 03/21/23 2032 90     Resp 03/21/23 2032 14     Temp 03/21/23 2032 98.6 F (37 C)     Temp Source 03/21/23 2032 Oral  SpO2 03/21/23 2032 100 %     Weight 03/21/23 2033 140 lb (63.5 kg)     Height 03/21/23 2033 5\' 3"  (1.6 m)     Head Circumference --      Peak Flow --      Pain Score 03/21/23 2032 0     Pain Loc --      Pain Edu? --      Excl. in GC? --     Updated Vital Signs: BP (!) 184/109 (BP Location: Right Arm)   Pulse 84   Temp 98.1 F (36.7 C) (Oral)   Resp 16   Ht 5\' 3"  (1.6 m)   Wt 63.5 kg   SpO2 100%   BMI 24.80 kg/m    General: Sleeping but arousable to voice, mild distress.  Mildly dry mucous membranes. CV:  RRR.  Good peripheral perfusion.  Resp:  Normal effort.  CTAB. Abd:  Mild diffuse tenderness to palpation without  rebound or guarding.  No distention.  Other:  No truncal vesicles.   ED Results / Procedures / Treatments  Labs (all labs ordered are listed, but only abnormal results are displayed) Labs Reviewed  COMPREHENSIVE METABOLIC PANEL - Abnormal; Notable for the following components:      Result Value   Potassium 2.8 (*)    Chloride 96 (*)    Glucose, Bld 129 (*)    Total Protein 8.7 (*)    All other components within normal limits  CBC - Abnormal; Notable for the following components:   RBC 5.14 (*)    MCV 76.8 (*)    MCH 23.5 (*)    RDW 16.8 (*)    Platelets 430 (*)    All other components within normal limits  URINALYSIS, ROUTINE W REFLEX MICROSCOPIC - Abnormal; Notable for the following components:   Color, Urine YELLOW (*)    APPearance CLEAR (*)    Specific Gravity, Urine >1.046 (*)    pH 9.0 (*)    Ketones, ur 20 (*)    Protein, ur 100 (*)    Bacteria, UA RARE (*)    All other components within normal limits  LIPASE, BLOOD  ETHANOL  URINE DRUG SCREEN, QUALITATIVE ([facility] ONLY)  TROPONIN I (HIGH SENSITIVITY)     EKG  ED ECG REPORT I, Rami Waddle J, the attending physician, personally viewed and interpreted this ECG.   Date: 03/21/2023  EKG Time: 2042  Rate: 83  Rhythm: normal sinus rhythm  Axis: Normal  Intervals: QTC 553  ST&T Change: Nonspecific 10/24/2022 EKG QTc within normal limits   RADIOLOGY Independently visualized and interpreted patient's CT scan as well as noted the radiology interpretation:  CT head: No ICH  CT abdomen/pelvis: Gastroduodenitis, colitis, enteritis  Official radiology report(s): CT ABDOMEN PELVIS W CONTRAST  Result Date: 03/22/2023 CLINICAL DATA:  Nausea, vomiting and abdominal pain. EXAM: CT ABDOMEN AND PELVIS WITH CONTRAST TECHNIQUE: Multidetector CT imaging of the abdomen and pelvis was performed using the standard protocol following bolus administration of intravenous contrast. RADIATION DOSE REDUCTION: This exam was performed  according to the departmental dose-optimization program which includes automated exposure control, adjustment of the mA and/or kV according to patient size and/or use of iterative reconstruction technique. CONTRAST:  OMNIPAQUE IOHEXOL 300 MG/ML  SOLN COMPARISON:  CT with IV contrast 10/24/2022 and 10/19/2022. No older studies. FINDINGS: Lower chest: There is mild posterior atelectasis in the lung bases. No lung base infiltrate is seen. There is mild cardiomegaly and mild scattered three-vessel coronary  artery calcifications. Hepatobiliary: No focal liver abnormality is seen. No calcified gallstones, gallbladder wall thickening, or biliary dilatation. Pancreas: No abnormality Spleen: No abnormality. Adrenals/Urinary Tract: Adrenal glands are unremarkable. Kidneys are normal, without renal calculi, focal lesion, or hydronephrosis. Bladder is unremarkable. Stomach/Bowel: The last CT demonstrated an anterior gastric antral perforation which has been repaired in the interval. There are thickened folds throughout the stomach likely reflecting diffuse gastritis. There are thickened folds in the duodenum consistent with duodenitis. Rest of the small bowel is normal caliber and unremarkable without contrast. Nonspecific fluid filling in the pelvic small bowel is noted in can be seen with nonspecific enteritis but there is no overt wall thickening. The appendix is normal caliber. There is wall thickening versus nondistention of the descending colon. Correlate clinically for underlying colitis. The rectosigmoid segment and remaining colon are unremarkable. Vascular/Lymphatic: The abdominal aorta is tortuous, with scattered aortoiliac calcific plaques without AAA. No other superior inferior vascular findings. No adenopathy seen. Reproductive: Uterus and bilateral adnexa are unremarkable. Other: There are small umbilical and left inguinal fat hernias. There is no incarcerated hernia. There is no free fluid, free hemorrhage  or free air. A homogeneous thin walled subcutaneous fluid collection underlying the upper right groin crease is again noted and again measures 2.4 x 3.1 cm and 17 Hounsfield units, probably a seroma or liquified hematoma. This was seen previously. Musculoskeletal: Degenerative change lower lumbar spine. No acute or aggressive osseous abnormality. Degenerative spurring both SI joints. IMPRESSION: 1. Evidence of gastroduodenitis.  No evidence of a perforated ulcer. 2. Nonspecific fluid filling in the pelvic small bowel which can be seen with nonspecific enteritis but there is no overt bowel wall thickening. 3. Descending colitis versus nondistention. 4. Aortic and coronary artery atherosclerosis. 5. Small umbilical and left inguinal fat hernias. 6. 2.4 x 3.1 cm homogeneous thin walled subcutaneous fluid collection underlying the upper right groin crease, probably a seroma or liquified hematoma. This was seen previously. Aortic Atherosclerosis (ICD10-I70.0). Electronically Signed   By: Almira Bar M.D.   On: 03/22/2023 01:37   CT Head Wo Contrast  Result Date: 03/22/2023 CLINICAL DATA:  Nausea, vomiting, diarrhea, hypertension EXAM: CT HEAD WITHOUT CONTRAST TECHNIQUE: Contiguous axial images were obtained from the base of the skull through the vertex without intravenous contrast. RADIATION DOSE REDUCTION: This exam was performed according to the departmental dose-optimization program which includes automated exposure control, adjustment of the mA and/or kV according to patient size and/or use of iterative reconstruction technique. COMPARISON:  None Available. FINDINGS: Brain: No evidence of acute infarction, hemorrhage, mass, mass effect, or midline shift. No hydrocephalus or extra-axial fluid collection. Vascular: No hyperdense vessel. Skull: Negative for fracture or focal lesion. Sinuses/Orbits: Mucosal thickening in the ethmoid air cells. No acute finding in the orbits. Other: The mastoid air cells are well  aerated. IMPRESSION: No acute intracranial process. Electronically Signed   By: Wiliam Ke M.D.   On: 03/22/2023 01:16     PROCEDURES:  Critical Care performed: Yes, see critical care procedure note(s)  CRITICAL CARE Performed by: Irean Hong   Total critical care time: 45 minutes  Critical care time was exclusive of separately billable procedures and treating other patients.  Critical care was necessary to treat or prevent imminent or life-threatening deterioration.  Critical care was time spent personally by me on the following activities: development of treatment plan with patient and/or surrogate as well as nursing, discussions with consultants, evaluation of patient's response to treatment, examination of patient, obtaining history  from patient or surrogate, ordering and performing treatments and interventions, ordering and review of laboratory studies, ordering and review of radiographic studies, pulse oximetry and re-evaluation of patient's condition.   Marland Kitchen1-3 Lead EKG Interpretation  Performed by: Irean Hong, MD Authorized by: Irean Hong, MD     Interpretation: normal     ECG rate:  85   ECG rate assessment: normal     Rhythm: sinus rhythm     Ectopy: none     Conduction: normal   Comments:     Patient placed on cardiac monitor to evaluate for arrhythmias    MEDICATIONS ORDERED IN ED: Medications  potassium chloride 10 mEq in 100 mL IVPB (10 mEq Intravenous New Bag/Given 03/22/23 0241)  morphine (PF) 2 MG/ML injection 2 mg (2 mg Intravenous Given 03/22/23 0044)  sodium chloride 0.9 % bolus 1,000 mL (1,000 mLs Intravenous New Bag/Given 03/22/23 0153)  potassium chloride 10 mEq in 100 mL IVPB (0 mEq Intravenous Stopped 03/22/23 0240)  hydrALAZINE (APRESOLINE) injection 10 mg (10 mg Intravenous Given 03/22/23 0043)  famotidine (PEPCID) IVPB 20 mg premix (0 mg Intravenous Stopped 03/22/23 0226)  diphenhydrAMINE (BENADRYL) injection 25 mg (25 mg Intravenous Given 03/22/23  0042)  iohexol (OMNIPAQUE) 300 MG/ML solution 100 mL (100 mLs Intravenous Contrast Given 03/22/23 0054)  piperacillin-tazobactam (ZOSYN) IVPB 3.375 g (0 g Intravenous Stopped 03/22/23 0240)     IMPRESSION / MDM / ASSESSMENT AND PLAN / ED COURSE  I reviewed the triage vital signs and the nursing notes.                             62 year old female presenting with a several day history of nausea/vomiting/diarrhea, hypertensive due to not being able to tolerate her antihypertensives. Differential diagnosis includes, but is not limited to, ovarian cyst, ovarian torsion, acute appendicitis, diverticulitis, urinary tract infection/pyelonephritis, endometriosis, bowel obstruction, colitis, renal colic, gastroenteritis, hernia, etc. personally reviewed patient's records and note a surgery follow-up office visit on 11/22/2022 following gastric perforation status post surgery.  Patient's presentation is most consistent with acute presentation with potential threat to life or bodily function.  The patient is on the cardiac monitor to evaluate for evidence of arrhythmia and/or significant heart rate changes.  Laboratory results demonstrate normal WBC 5.6, normal hemoglobin 12.1, hypokalemia with potassium 2.8, troponin negative.  Will obtain CT head, abdomen/pelvis.  Awaiting urine sample.  Will initiate IV fluid resuscitation, IV potassium replacement, IV hydralazine for blood pressure control, IV Pepcid given patient's history of peptic ulcer.  Administer IV morphine for pain paired with Benadryl for nausea as patient has long QTc on her EKG tonight.  Will reassess.  Clinical Course as of 03/22/23 0249  Fri Mar 22, 2023  0005 Patient vomited immediately after administration of IV Benadryl; will monitor and redose if needed. [JS]  0217 CT demonstrates colitis.  CT head negative.  Will initiate IV Zosyn and consult hospitalist for evaluation and admission. [JS]    Clinical Course User Index [JS] Irean Hong, MD     FINAL CLINICAL IMPRESSION(S) / ED DIAGNOSES   Final diagnoses:  Nausea vomiting and diarrhea  Hypertension, unspecified type  Hypokalemia  Colitis  Long QT interval  Dehydration     Rx / DC Orders   ED Discharge Orders     None        Note:  This document was prepared using Dragon voice recognition software and may include unintentional  dictation errors.   Irean Hong, MD 03/22/23 2130    Irean Hong, MD 03/22/23 [id number]

## 2023-03-21 NOTE — ED Triage Notes (Signed)
Pt arrived POV for N/V/D for 2 days, pt reports not abel to eat or drink. Pt denies any abd pain. HTN noted, has not taken her BP meds in 2-3 days. Hx of gastritis, A&O x4, NAD noted.

## 2023-03-22 ENCOUNTER — Other Ambulatory Visit: Payer: Self-pay

## 2023-03-22 ENCOUNTER — Emergency Department: Payer: 59

## 2023-03-22 ENCOUNTER — Encounter: Payer: Self-pay | Admitting: Family Medicine

## 2023-03-22 DIAGNOSIS — R112 Nausea with vomiting, unspecified: Secondary | ICD-10-CM | POA: Diagnosis not present

## 2023-03-22 DIAGNOSIS — Z7982 Long term (current) use of aspirin: Secondary | ICD-10-CM | POA: Diagnosis not present

## 2023-03-22 DIAGNOSIS — E876 Hypokalemia: Secondary | ICD-10-CM | POA: Diagnosis not present

## 2023-03-22 DIAGNOSIS — I160 Hypertensive urgency: Secondary | ICD-10-CM | POA: Diagnosis not present

## 2023-03-22 DIAGNOSIS — F1410 Cocaine abuse, uncomplicated: Secondary | ICD-10-CM | POA: Diagnosis present

## 2023-03-22 DIAGNOSIS — F1110 Opioid abuse, uncomplicated: Secondary | ICD-10-CM | POA: Diagnosis present

## 2023-03-22 DIAGNOSIS — E860 Dehydration: Secondary | ICD-10-CM | POA: Diagnosis present

## 2023-03-22 DIAGNOSIS — F1910 Other psychoactive substance abuse, uncomplicated: Secondary | ICD-10-CM | POA: Insufficient documentation

## 2023-03-22 DIAGNOSIS — K529 Noninfective gastroenteritis and colitis, unspecified: Secondary | ICD-10-CM

## 2023-03-22 DIAGNOSIS — K2990 Gastroduodenitis, unspecified, without bleeding: Secondary | ICD-10-CM

## 2023-03-22 DIAGNOSIS — D509 Iron deficiency anemia, unspecified: Secondary | ICD-10-CM | POA: Diagnosis present

## 2023-03-22 DIAGNOSIS — F17210 Nicotine dependence, cigarettes, uncomplicated: Secondary | ICD-10-CM | POA: Diagnosis present

## 2023-03-22 DIAGNOSIS — I700 Atherosclerosis of aorta: Secondary | ICD-10-CM | POA: Diagnosis not present

## 2023-03-22 DIAGNOSIS — E878 Other disorders of electrolyte and fluid balance, not elsewhere classified: Secondary | ICD-10-CM | POA: Diagnosis present

## 2023-03-22 DIAGNOSIS — K219 Gastro-esophageal reflux disease without esophagitis: Secondary | ICD-10-CM | POA: Diagnosis present

## 2023-03-22 DIAGNOSIS — D75839 Thrombocytosis, unspecified: Secondary | ICD-10-CM | POA: Diagnosis present

## 2023-03-22 DIAGNOSIS — Z79899 Other long term (current) drug therapy: Secondary | ICD-10-CM | POA: Diagnosis not present

## 2023-03-22 DIAGNOSIS — R109 Unspecified abdominal pain: Secondary | ICD-10-CM | POA: Diagnosis not present

## 2023-03-22 DIAGNOSIS — Z8249 Family history of ischemic heart disease and other diseases of the circulatory system: Secondary | ICD-10-CM | POA: Diagnosis not present

## 2023-03-22 DIAGNOSIS — Z716 Tobacco abuse counseling: Secondary | ICD-10-CM | POA: Diagnosis not present

## 2023-03-22 DIAGNOSIS — R9431 Abnormal electrocardiogram [ECG] [EKG]: Secondary | ICD-10-CM | POA: Diagnosis present

## 2023-03-22 DIAGNOSIS — R1110 Vomiting, unspecified: Secondary | ICD-10-CM | POA: Diagnosis not present

## 2023-03-22 DIAGNOSIS — I10 Essential (primary) hypertension: Secondary | ICD-10-CM | POA: Diagnosis present

## 2023-03-22 DIAGNOSIS — Z8711 Personal history of peptic ulcer disease: Secondary | ICD-10-CM | POA: Diagnosis not present

## 2023-03-22 DIAGNOSIS — I2510 Atherosclerotic heart disease of native coronary artery without angina pectoris: Secondary | ICD-10-CM | POA: Diagnosis present

## 2023-03-22 DIAGNOSIS — Z7151 Drug abuse counseling and surveillance of drug abuser: Secondary | ICD-10-CM | POA: Diagnosis not present

## 2023-03-22 LAB — IRON AND TIBC
Iron: 27 ug/dL — ABNORMAL LOW (ref 28–170)
Saturation Ratios: 7 % — ABNORMAL LOW (ref 10.4–31.8)
TIBC: 381 ug/dL (ref 250–450)
UIBC: 354 ug/dL

## 2023-03-22 LAB — URINE DRUG SCREEN, QUALITATIVE (ARMC ONLY)
Amphetamines, Ur Screen: NOT DETECTED
Barbiturates, Ur Screen: NOT DETECTED
Benzodiazepine, Ur Scrn: NOT DETECTED
Cannabinoid 50 Ng, Ur ~~LOC~~: NOT DETECTED
Cocaine Metabolite,Ur ~~LOC~~: POSITIVE — AB
MDMA (Ecstasy)Ur Screen: NOT DETECTED
Methadone Scn, Ur: NOT DETECTED
Opiate, Ur Screen: POSITIVE — AB
Phencyclidine (PCP) Ur S: NOT DETECTED
Tricyclic, Ur Screen: NOT DETECTED

## 2023-03-22 LAB — CBC
HCT: 32 % — ABNORMAL LOW (ref 36.0–46.0)
Hemoglobin: 9.6 g/dL — ABNORMAL LOW (ref 12.0–15.0)
MCH: 23.9 pg — ABNORMAL LOW (ref 26.0–34.0)
MCHC: 30 g/dL (ref 30.0–36.0)
MCV: 79.6 fL — ABNORMAL LOW (ref 80.0–100.0)
Platelets: 296 10*3/uL (ref 150–400)
RBC: 4.02 MIL/uL (ref 3.87–5.11)
RDW: 17.1 % — ABNORMAL HIGH (ref 11.5–15.5)
WBC: 4.7 10*3/uL (ref 4.0–10.5)
nRBC: 0 % (ref 0.0–0.2)

## 2023-03-22 LAB — ETHANOL: Alcohol, Ethyl (B): <10 mg/dL (ref <10)

## 2023-03-22 LAB — URINALYSIS, ROUTINE W REFLEX MICROSCOPIC
Bilirubin Urine: NEGATIVE
Glucose, UA: NEGATIVE mg/dL
Hgb urine dipstick: NEGATIVE
Ketones, ur: 20 mg/dL — AB
Leukocytes,Ua: NEGATIVE
Nitrite: NEGATIVE
Protein, ur: 100 mg/dL — AB
Specific Gravity, Urine: >1.046 — ABNORMAL HIGH (ref 1.005–1.030)
pH: 9 — ABNORMAL HIGH (ref 5.0–8.0)

## 2023-03-22 LAB — BASIC METABOLIC PANEL
Anion gap: 9 (ref 5–15)
BUN: 10 mg/dL (ref 8–23)
CO2: 21 mmol/L — ABNORMAL LOW (ref 22–32)
Calcium: 7.1 mg/dL — ABNORMAL LOW (ref 8.9–10.3)
Chloride: 111 mmol/L (ref 98–111)
Creatinine, Ser: 0.62 mg/dL (ref 0.44–1.00)
GFR, Estimated: >60 mL/min (ref >60)
Glucose, Bld: 86 mg/dL (ref 70–99)
Potassium: 3.4 mmol/L — ABNORMAL LOW (ref 3.5–5.1)
Sodium: 141 mmol/L (ref 135–145)

## 2023-03-22 LAB — PHOSPHORUS: Phosphorus: 2.5 mg/dL (ref 2.5–4.6)

## 2023-03-22 LAB — FOLATE: Folate: 19.5 ng/mL (ref >5.9)

## 2023-03-22 LAB — MAGNESIUM: Magnesium: 1.5 mg/dL — ABNORMAL LOW (ref 1.7–2.4)

## 2023-03-22 MED ORDER — METOCLOPRAMIDE HCL 5 MG/ML IJ SOLN
10.0000 mg | Freq: Four times a day (QID) | INTRAMUSCULAR | Status: DC | PRN
  Administered 2023-03-22: 10 mg via INTRAVENOUS
  Filled 2023-03-22: qty 2

## 2023-03-22 MED ORDER — LISINOPRIL-HYDROCHLOROTHIAZIDE 20-25 MG PO TABS: 1.0000 | ORAL_TABLET | Freq: Every day | ORAL | Status: DC

## 2023-03-22 MED ORDER — POTASSIUM CHLORIDE 20 MEQ PO PACK
40.0000 meq | PACK | Freq: Once | ORAL | Status: AC
  Administered 2023-03-22: 40 meq via ORAL
  Filled 2023-03-22: qty 2

## 2023-03-22 MED ORDER — HYDRALAZINE HCL 20 MG/ML IJ SOLN
10.0000 mg | Freq: Four times a day (QID) | INTRAMUSCULAR | Status: DC | PRN
  Administered 2023-03-22: 10 mg via INTRAVENOUS
  Filled 2023-03-22: qty 1

## 2023-03-22 MED ORDER — HYDROCHLOROTHIAZIDE 25 MG PO TABS: 25.0000 mg | ORAL_TABLET | Freq: Every day | ORAL | Status: DC

## 2023-03-22 MED ORDER — LABETALOL HCL 5 MG/ML IV SOLN: 20.0000 mg | INTRAVENOUS | Status: DC | PRN

## 2023-03-22 MED ORDER — SODIUM CHLORIDE 0.9 % IV SOLN
200.0000 mg | INTRAVENOUS | Status: DC
  Administered 2023-03-22: 200 mg via INTRAVENOUS
  Filled 2023-03-22: qty 200
  Filled 2023-03-22: qty 10

## 2023-03-22 MED ORDER — ENOXAPARIN SODIUM 40 MG/0.4ML IJ SOSY: 40.0000 mg | PREFILLED_SYRINGE | INTRAMUSCULAR | Status: DC

## 2023-03-22 MED ORDER — PIPERACILLIN-TAZOBACTAM 3.375 G IVPB 30 MIN
3.3750 g | Freq: Once | INTRAVENOUS | Status: AC
  Administered 2023-03-22: 3.375 g via INTRAVENOUS
  Filled 2023-03-22: qty 50

## 2023-03-22 MED ORDER — CALCIUM CARBONATE ANTACID 500 MG PO CHEW
1.0000 | CHEWABLE_TABLET | Freq: Three times a day (TID) | ORAL | Status: DC | PRN
  Administered 2023-03-22: 200 mg via ORAL
  Filled 2023-03-22: qty 1

## 2023-03-22 MED ORDER — IOHEXOL 300 MG/ML  SOLN
100.0000 mL | Freq: Once | INTRAMUSCULAR | Status: AC | PRN
  Administered 2023-03-22: 100 mL via INTRAVENOUS

## 2023-03-22 MED ORDER — FAMOTIDINE 20 MG PO TABS: 20.0000 mg | ORAL_TABLET | Freq: Two times a day (BID) | ORAL | Status: DC

## 2023-03-22 MED ORDER — TRAZODONE HCL 50 MG PO TABS: 25.0000 mg | ORAL_TABLET | Freq: Every evening | ORAL | Status: DC | PRN

## 2023-03-22 MED ORDER — MAGNESIUM SULFATE 4 GM/100ML IV SOLN
4.0000 g | Freq: Once | INTRAVENOUS | Status: AC
  Administered 2023-03-22: 4 g via INTRAVENOUS
  Filled 2023-03-22: qty 100

## 2023-03-22 MED ORDER — POLYSACCHARIDE IRON COMPLEX 150 MG PO CAPS: 150.0000 mg | ORAL_CAPSULE | Freq: Every day | ORAL | Status: DC

## 2023-03-22 MED ORDER — PIPERACILLIN-TAZOBACTAM 3.375 G IVPB 30 MIN: 3.3750 g | Freq: Four times a day (QID) | INTRAVENOUS | Status: DC

## 2023-03-22 MED ORDER — ACETAMINOPHEN 650 MG RE SUPP: 650.0000 mg | Freq: Four times a day (QID) | RECTAL | Status: DC | PRN

## 2023-03-22 MED ORDER — PIPERACILLIN-TAZOBACTAM 3.375 G IVPB
3.3750 g | Freq: Three times a day (TID) | INTRAVENOUS | Status: DC
  Administered 2023-03-22 – 2023-03-23 (×3): 3.375 g via INTRAVENOUS
  Filled 2023-03-22 (×3): qty 50

## 2023-03-22 MED ORDER — LISINOPRIL 20 MG PO TABS
20.0000 mg | ORAL_TABLET | Freq: Every day | ORAL | Status: DC
  Administered 2023-03-23: 20 mg via ORAL
  Filled 2023-03-22: qty 2
  Filled 2023-03-22: qty 1

## 2023-03-22 MED ORDER — NICOTINE 21 MG/24HR TD PT24
21.0000 mg | MEDICATED_PATCH | Freq: Every day | TRANSDERMAL | Status: DC
  Administered 2023-03-22 – 2023-03-23 (×2): 21 mg via TRANSDERMAL
  Filled 2023-03-22 (×2): qty 1

## 2023-03-22 MED ORDER — MAGNESIUM HYDROXIDE 400 MG/5ML PO SUSP: 30.0000 mL | Freq: Every day | ORAL | Status: DC | PRN

## 2023-03-22 MED ORDER — SODIUM CHLORIDE 0.9 % IV SOLN: INTRAVENOUS | Status: DC

## 2023-03-22 MED ORDER — PANTOPRAZOLE SODIUM 40 MG IV SOLR
40.0000 mg | Freq: Two times a day (BID) | INTRAVENOUS | Status: DC
  Administered 2023-03-22 – 2023-03-23 (×3): 40 mg via INTRAVENOUS
  Filled 2023-03-22 (×3): qty 10

## 2023-03-22 MED ORDER — POLYETHYLENE GLYCOL 3350 17 G PO PACK
17.0000 g | PACK | Freq: Every day | ORAL | Status: DC
  Administered 2023-03-23: 17 g via ORAL
  Filled 2023-03-22 (×2): qty 1

## 2023-03-22 MED ORDER — ACETAMINOPHEN 325 MG PO TABS: 650.0000 mg | ORAL_TABLET | Freq: Four times a day (QID) | ORAL | Status: DC | PRN

## 2023-03-22 NOTE — Plan of Care (Signed)
Patient was seen and examined at bedside, patient was admitted overnight due to nausea vomiting and diarrhea for past 2 days.  Currently patient is asymptomatic. Hypomagnesemia, mag repleted. We will continue current treatment and follow along.

## 2023-03-22 NOTE — ED Notes (Signed)
Pt ambulated to bathroom and back independently, states she's feels better than when she arrived.

## 2023-03-22 NOTE — Assessment & Plan Note (Addendum)
-   This is including cocaine and tobacco as well as occasional marijuana. - She was counseled for cessation of all. - We will place her on NicoDerm CQ patch.

## 2023-03-22 NOTE — Assessment & Plan Note (Addendum)
-   The patient has subsequent intractable nausea and vomiting. - She will be placed on IV PPI therapy. - Her aspirin will be held off. - We will continue oral  H2 blocker therapy. - She will be hydrated with IV normal saline and placed on p.o. clear liquids as tolerated to be advanced.

## 2023-03-22 NOTE — Assessment & Plan Note (Signed)
-  We will replace potassium and check magnesium level. 

## 2023-03-22 NOTE — ED Notes (Signed)
Pt. Resting comfortably, eyes closed, chest rise and fall.

## 2023-03-22 NOTE — ED Notes (Signed)
Pt. Resting comfortably in stretcher, eyes closed, chest rise and fall, NAD. Pulse ox and BP monitoring maintained.

## 2023-03-22 NOTE — Assessment & Plan Note (Signed)
-   We will continue her on IV Zosyn. - We will continue hydration as mentioned above.

## 2023-03-22 NOTE — Assessment & Plan Note (Addendum)
-   This is clearly secondary to her intractable nausea and vomiting and inability to keep antihypertensives. - She will be placed on as needed IV labetalol and hydralazine. - We will hold off HCTZ given her hypokalemia and continue Zestril for now.

## 2023-03-22 NOTE — ED Notes (Signed)
Back from b/r, steady gait. Sitting on edge of bed eating, NAD, calm.

## 2023-03-22 NOTE — ED Notes (Addendum)
This RN to bedside to answer call light, pt. Requesting to be unhooked from monitor to get up to toilet. Pt. Up to bathroom indep. Gait steady, NAD. Pt. Sitting on side of bed, steady, drinking breakfast. Pt. Verbalizes some nausea with eating. See [facility]

## 2023-03-22 NOTE — H&P (Signed)
[place]   PATIENT NAME: Robin Mckinney    MR#:  [id number]  DATE OF BIRTH:  1961-05-17  DATE OF ADMISSION:  03/21/2023  PRIMARY CARE PHYSICIAN: [facility]   Patient is coming from: Home  REQUESTING/REFERRING PHYSICIAN: Chiquita Loth, MD  CHIEF COMPLAINT:   Chief Complaint  Patient presents with  . N/V/D    HISTORY OF PRESENT ILLNESS:  Robin Mckinney is a 62 y.o. African-American female with medical history significant for coronary artery disease, GERD, hypertension, polysubstance abuse and gastric ulcer who presented to the emergency room with acute onset of intractable nausea and vomiting over the last couple days with associated diarrhea.  She has not been able to keep any food p.o. medications secondarily.  No alcohol intake for a month.  No fever or chills.  No melena or bright red bleeding per rectum.  No hematemesis or jaundice.  No dysuria, oliguria or hematuria or flank pain.  No cough or wheezing or hemoptysis.  No other bleeding diathesis.  She denies any chest pain or palpitations.  ED Course: When she came to the ER, BP was 203/138 and later 197/114 with otherwise normal vital signs.  Labs revealed hypokalemia of 2.8 and hypochloremia 96 with blood glucose of 129.  Total protein was 8.7 otherwise CMP was within normal.  CBC showed microcytosis and thrombocytosis.  Urine tox was positive for cocaine and opiates.  UA showed high specific gravity 1046 with rare bacteria and 0-5 WBCs, 20 ketones and 100 protein. EKG as reviewed by me : EKG showed normal sinus rhythm with sinus arrhythmia with a rate of 83, right axis deviation, minimal voltage criteria for LVH, prolonged QT interval with QTc of 554 Imaging: Abdominal pelvic CT scan with contrast revealed the following: 1. Evidence of gastroduodenitis.  No evidence of a perforated ulcer. 2. Nonspecific fluid filling in the pelvic small bowel which can be seen with nonspecific enteritis but  there is no overt bowel wall thickening. 3. Descending colitis versus nondistention. 4. Aortic and coronary artery atherosclerosis. 5. Small umbilical and left inguinal fat hernias. 6. 2.4 x 3.1 cm homogeneous thin walled subcutaneous fluid collection underlying the upper right groin crease, probably a seroma or liquified hematoma. This was seen previously. 7.  Aortic atherosclerosis.  The patient was given 20 mg of IV Pepcid, 25 mg of IV Benadryl, 2 mg of IV morphine sulfate, 20 mcg of IV potassium chloride, 1 L bolus of IV normal saline, 10 mg of IV hydralazine and 3.375 g of IV Zosyn.  She will be admitted to a medical telemetry bed for further evaluation and management.  PAST MEDICAL HISTORY:   Past Medical History:  Diagnosis Date  . Acute gastric ulcer with perforation (HCC) 10/24/2022  . CAD (coronary artery disease)   . GERD (gastroesophageal reflux disease)   . H/O cesarean section   . Hypertension   . Tobacco abuse     PAST SURGICAL HISTORY:   Past Surgical History:  Procedure Laterality Date  . CESAREAN SECTION      SOCIAL HISTORY:   Social History   Tobacco Use  . Smoking status: Every Day    Packs/day: .5    Types: Cigarettes  . Smokeless tobacco: Never  Substance Use Topics  . Alcohol use: Not Currently    FAMILY HISTORY:   Family History  Problem Relation Age of Onset  . Hypertension Mother   . Hypertension Father     DRUG ALLERGIES:  No Known Allergies  REVIEW OF SYSTEMS:   ROS As per history of present illness. All pertinent systems were reviewed above. Constitutional, HEENT, cardiovascular, respiratory, GI, GU, musculoskeletal, neuro, psychiatric, endocrine, integumentary and hematologic systems were reviewed and are otherwise negative/unremarkable except for positive findings mentioned above in the HPI.   MEDICATIONS AT HOME:   Prior to Admission medications   Medication Sig Start Date End Date Taking? Authorizing Provider  aspirin  EC 81 MG tablet Take 1 tablet (81 mg total) by mouth daily. 10/22/22  Yes Sunnie Nielsen, DO  calcium carbonate (TUMS - DOSED IN MG ELEMENTAL CALCIUM) 500 MG chewable tablet Chew 1-2 tablets (200-400 mg of elemental calcium total) by mouth 3 (three) times daily as needed for indigestion or heartburn. 10/22/22  Yes Sunnie Nielsen, DO  famotidine (PEPCID) 20 MG tablet Take 1 tablet (20 mg total) by mouth 2 (two) times daily. 10/22/22  Yes Sunnie Nielsen, DO  nicotine (NICODERM CQ - DOSED IN MG/24 HOURS) 21 mg/24hr patch Place 1 patch (21 mg total) onto the skin daily. 10/22/22  Yes Sunnie Nielsen, DO  polyethylene glycol (MIRALAX / GLYCOLAX) 17 g packet Take 17 g by mouth daily. 10/22/22  Yes Sunnie Nielsen, DO  Vitamin D, Ergocalciferol, (DRISDOL) 1.25 MG (50000 UNIT) CAPS capsule Take 50,000 Units by mouth every 7 (seven) days. 12/12/22  Yes [provider]  HYDROcodone-acetaminophen (NORCO/VICODIN) 5-325 MG tablet Take 1 tablet by mouth every 6 (six) hours as needed for moderate pain or severe pain. Patient not taking: Reported on 03/22/2023 10/31/22   Donovan Kail, PA-C  lisinopril-hydrochlorothiazide (ZESTORETIC) 20-25 MG tablet Take 1 tablet by mouth daily. 11/08/22   [provider]  omeprazole (PRILOSEC) 40 MG capsule Take 1 capsule (40 mg total) by mouth in the morning and at bedtime. 10/31/22 12/30/22  Donovan Kail, PA-C      VITAL SIGNS:  Blood pressure (!) 157/118, pulse 86, temperature 98.1 F (36.7 C), temperature source Oral, resp. rate 16, height 5\' 3"  (1.6 m), weight 63.5 kg, SpO2 100 %.  PHYSICAL EXAMINATION:  Physical Exam  GENERAL:  62 y.o.-year-old patient lying in the bed with no acute distress.  EYES: Pupils equal, round, reactive to light and accommodation. No scleral icterus. Extraocular muscles intact.  HEENT: Head atraumatic, normocephalic. Oropharynx and nasopharynx clear.  NECK:  Supple, no jugular venous distention. No thyroid  enlargement, no tenderness.  LUNGS: Normal breath sounds bilaterally, no wheezing, rales,rhonchi or crepitation. No use of accessory muscles of respiration.  CARDIOVASCULAR: Regular rate and rhythm, S1, S2 normal. No murmurs, rubs, or gallops.  ABDOMEN: Soft, nondistended, nontender. Bowel sounds present. No organomegaly or mass.  EXTREMITIES: No pedal edema, cyanosis, or clubbing.  NEUROLOGIC: Cranial nerves II through XII are intact. Muscle strength 5/5 in all extremities. Sensation intact. Gait not checked.  PSYCHIATRIC: The patient is alert and oriented x 3.  Normal affect and good eye contact. SKIN: No obvious rash, lesion, or ulcer.   LABORATORY PANEL:   CBC Recent Labs  Lab 03/21/23 2037  WBC 5.6  HGB 12.1  HCT 39.5  PLT 430*   ------------------------------------------------------------------------------------------------------------------  Chemistries  Recent Labs  Lab 03/21/23 2037  NA 139  K 2.8*  CL 96*  CO2 29  GLUCOSE 129*  BUN 12  CREATININE 0.79  CALCIUM 9.5  AST 25  ALT 12  ALKPHOS 89  BILITOT 0.9   ------------------------------------------------------------------------------------------------------------------  Cardiac Enzymes No results for input(s): "TROPONINI" in the last 168 hours. ------------------------------------------------------------------------------------------------------------------  RADIOLOGY:  CT ABDOMEN PELVIS W CONTRAST  Result Date: 03/22/2023 CLINICAL DATA:  Nausea, vomiting and abdominal pain. EXAM: CT ABDOMEN AND PELVIS WITH CONTRAST TECHNIQUE: Multidetector CT imaging of the abdomen and pelvis was performed using the standard protocol following bolus administration of intravenous contrast. RADIATION DOSE REDUCTION: This exam was performed according to the departmental dose-optimization program which includes automated exposure control, adjustment of the mA and/or kV according to patient size and/or use of iterative  reconstruction technique. CONTRAST:  OMNIPAQUE IOHEXOL 300 MG/ML  SOLN COMPARISON:  CT with IV contrast 10/24/2022 and 10/19/2022. No older studies. FINDINGS: Lower chest: There is mild posterior atelectasis in the lung bases. No lung base infiltrate is seen. There is mild cardiomegaly and mild scattered three-vessel coronary artery calcifications. Hepatobiliary: No focal liver abnormality is seen. No calcified gallstones, gallbladder wall thickening, or biliary dilatation. Pancreas: No abnormality Spleen: No abnormality. Adrenals/Urinary Tract: Adrenal glands are unremarkable. Kidneys are normal, without renal calculi, focal lesion, or hydronephrosis. Bladder is unremarkable. Stomach/Bowel: The last CT demonstrated an anterior gastric antral perforation which has been repaired in the interval. There are thickened folds throughout the stomach likely reflecting diffuse gastritis. There are thickened folds in the duodenum consistent with duodenitis. Rest of the small bowel is normal caliber and unremarkable without contrast. Nonspecific fluid filling in the pelvic small bowel is noted in can be seen with nonspecific enteritis but there is no overt wall thickening. The appendix is normal caliber. There is wall thickening versus nondistention of the descending colon. Correlate clinically for underlying colitis. The rectosigmoid segment and remaining colon are unremarkable. Vascular/Lymphatic: The abdominal aorta is tortuous, with scattered aortoiliac calcific plaques without AAA. No other superior inferior vascular findings. No adenopathy seen. Reproductive: Uterus and bilateral adnexa are unremarkable. Other: There are small umbilical and left inguinal fat hernias. There is no incarcerated hernia. There is no free fluid, free hemorrhage or free air. A homogeneous thin walled subcutaneous fluid collection underlying the upper right groin crease is again noted and again measures 2.4 x 3.1 cm and 17 Hounsfield  units, probably a seroma or liquified hematoma. This was seen previously. Musculoskeletal: Degenerative change lower lumbar spine. No acute or aggressive osseous abnormality. Degenerative spurring both SI joints. IMPRESSION: 1. Evidence of gastroduodenitis.  No evidence of a perforated ulcer. 2. Nonspecific fluid filling in the pelvic small bowel which can be seen with nonspecific enteritis but there is no overt bowel wall thickening. 3. Descending colitis versus nondistention. 4. Aortic and coronary artery atherosclerosis. 5. Small umbilical and left inguinal fat hernias. 6. 2.4 x 3.1 cm homogeneous thin walled subcutaneous fluid collection underlying the upper right groin crease, probably a seroma or liquified hematoma. This was seen previously. Aortic Atherosclerosis (ICD10-I70.0). Electronically Signed   By: Almira Bar M.D.   On: 03/22/2023 01:37   CT Head Wo Contrast  Result Date: 03/22/2023 CLINICAL DATA:  Nausea, vomiting, diarrhea, hypertension EXAM: CT HEAD WITHOUT CONTRAST TECHNIQUE: Contiguous axial images were obtained from the base of the skull through the vertex without intravenous contrast. RADIATION DOSE REDUCTION: This exam was performed according to the departmental dose-optimization program which includes automated exposure control, adjustment of the mA and/or kV according to patient size and/or use of iterative reconstruction technique. COMPARISON:  None Available. FINDINGS: Brain: No evidence of acute infarction, hemorrhage, mass, mass effect, or midline shift. No hydrocephalus or extra-axial fluid collection. Vascular: No hyperdense vessel. Skull: Negative for fracture or focal lesion. Sinuses/Orbits: Mucosal thickening in the ethmoid air cells. No acute  finding in the orbits. Other: The mastoid air cells are well aerated. IMPRESSION: No acute intracranial process. Electronically Signed   By: Wiliam Ke M.D.   On: 03/22/2023 01:16      IMPRESSION AND PLAN:  Assessment and  Plan: * Gastroduodenitis without bleeding - The patient has subsequent intractable nausea and vomiting. - She will be placed on IV PPI therapy. - Her aspirin will be held off. - We will continue oral  H2 blocker therapy. - She will be hydrated with IV normal saline and placed on p.o. clear liquids as tolerated to be advanced.  Enterocolitis - We will continue her on IV Zosyn. - We will continue hydration as mentioned above.  Hypertensive urgency - This is clearly secondary to her intractable nausea and vomiting and inability to keep antihypertensives. - She will be placed on as needed IV labetalol and hydralazine. - We will hold off HCTZ given her hypokalemia and continue Zestril for now.  Hypokalemia - We will replace potassium and check magnesium level.  Polysubstance abuse (HCC) - This is including cocaine and tobacco as well as occasional marijuana. - She was counseled for cessation of all. - We will place her on NicoDerm CQ patch.  DVT prophylaxis: SCD's.  Medical prophylaxis is currently contraindicated due to high risk of bleeding. Advanced Care Planning:  Code Status: full code. Family Communication:  The plan of care was discussed in details with the patient (and family). I answered all questions. The patient agreed to proceed with the above mentioned plan. Further management will depend upon hospital course. Disposition Plan: Back to previous home environment Consults called: none. All the records are reviewed and case discussed with ED provider.  Status is: Inpatient   At the time of the admission, it appears that the appropriate admission status for this patient is inpatient.  This is judged to be reasonable and necessary in order to provide the required intensity of service to ensure the patient's safety given the presenting symptoms, physical exam findings and initial radiographic and laboratory data in the context of comorbid conditions.  The patient requires  inpatient status due to high intensity of service, high risk of further deterioration and high frequency of surveillance required.  I certify that at the time of admission, it is my clinical judgment that the patient will require inpatient hospital care extending more than 2 midnights.                            Dispo: The patient is from: Home              Anticipated d/c is to: Home              Patient currently is not medically stable to d/c.              Difficult to place patient: No  Hannah Beat M.D on 03/22/2023 at 5:00 AM  [facility]   From 7 PM-7 AM, contact night-coverage [email or web address]  CC: Primary care physician; [facility]

## 2023-03-23 DIAGNOSIS — K2990 Gastroduodenitis, unspecified, without bleeding: Secondary | ICD-10-CM | POA: Diagnosis not present

## 2023-03-23 LAB — CBC
HCT: 30.6 % — ABNORMAL LOW (ref 36.0–46.0)
Hemoglobin: 9.3 g/dL — ABNORMAL LOW (ref 12.0–15.0)
MCH: 23.7 pg — ABNORMAL LOW (ref 26.0–34.0)
MCHC: 30.4 g/dL (ref 30.0–36.0)
MCV: 77.9 fL — ABNORMAL LOW (ref 80.0–100.0)
Platelets: 324 10*3/uL (ref 150–400)
RBC: 3.93 MIL/uL (ref 3.87–5.11)
RDW: 16.9 % — ABNORMAL HIGH (ref 11.5–15.5)
WBC: 5 10*3/uL (ref 4.0–10.5)
nRBC: 0 % (ref 0.0–0.2)

## 2023-03-23 LAB — PHOSPHORUS: Phosphorus: 2.2 mg/dL — ABNORMAL LOW (ref 2.5–4.6)

## 2023-03-23 LAB — BASIC METABOLIC PANEL
Anion gap: 5 (ref 5–15)
BUN: 10 mg/dL (ref 8–23)
CO2: 24 mmol/L (ref 22–32)
Calcium: 8.3 mg/dL — ABNORMAL LOW (ref 8.9–10.3)
Chloride: 110 mmol/L (ref 98–111)
Creatinine, Ser: 0.83 mg/dL (ref 0.44–1.00)
GFR, Estimated: >60 mL/min (ref >60)
Glucose, Bld: 87 mg/dL (ref 70–99)
Potassium: 3.7 mmol/L (ref 3.5–5.1)
Sodium: 139 mmol/L (ref 135–145)

## 2023-03-23 LAB — MAGNESIUM: Magnesium: 2.4 mg/dL (ref 1.7–2.4)

## 2023-03-23 LAB — VITAMIN B12: Vitamin B-12: 250 pg/mL (ref 180–914)

## 2023-03-23 MED ORDER — OMEPRAZOLE 40 MG PO CPDR: 40.0000 mg | DELAYED_RELEASE_CAPSULE | Freq: Two times a day (BID) | ORAL | 1 refills | Status: AC

## 2023-03-23 MED ORDER — HYDRALAZINE HCL 20 MG/ML IJ SOLN: 10.0000 mg | Freq: Four times a day (QID) | INTRAMUSCULAR | Status: DC | PRN

## 2023-03-23 MED ORDER — LISINOPRIL 20 MG PO TABS: 20.0000 mg | ORAL_TABLET | Freq: Every day | ORAL | 3 refills | Status: DC

## 2023-03-23 MED ORDER — POLYSACCHARIDE IRON COMPLEX 150 MG PO CAPS: 150.0000 mg | ORAL_CAPSULE | Freq: Every day | ORAL | 0 refills | Status: DC

## 2023-03-23 MED ORDER — VITAMIN B-12 1000 MCG PO TABS: 1000.0000 ug | ORAL_TABLET | Freq: Every day | ORAL | 1 refills | Status: AC

## 2023-03-23 MED ORDER — HYDRALAZINE HCL 50 MG PO TABS: 50.0000 mg | ORAL_TABLET | Freq: Four times a day (QID) | ORAL | Status: DC | PRN

## 2023-03-23 MED ORDER — K PHOS MONO-SOD PHOS DI & MONO 155-852-130 MG PO TABS
500.0000 mg | ORAL_TABLET | Freq: Three times a day (TID) | ORAL | Status: DC
  Administered 2023-03-23: 500 mg via ORAL
  Filled 2023-03-23: qty 2

## 2023-03-23 NOTE — Progress Notes (Signed)
Pt discharged via wheelchair by the volunteer

## 2023-03-23 NOTE — Plan of Care (Signed)

## 2023-03-23 NOTE — Discharge Summary (Signed)
[facility] Discharge Summary   Patient: Robin Mckinney:[id number]  PCP: [facility]  Date of admission: 03/21/2023   Date of discharge:  03/23/2023     Discharge Diagnoses:  Principal Problem:   Gastroduodenitis without bleeding Active Problems:   Enterocolitis   Hypertensive urgency   Hypokalemia   Polysubstance abuse (HCC)   Admitted From: Home Disposition:  Home   Recommendations for Outpatient Follow-up:  Follow-up with PCP in 1 week, continue to monitor BP at home and follow with PCP to titrate medication accordingly.  Repeat iron profile after 3 to 6 months.  Discontinued hydrochlorothiazide due to electrolyte imbalance Follow up LABS/TEST:  as above   Diet recommendation: Cardiac diet  Activity: The patient is advised to gradually reintroduce usual activities, as tolerated  Discharge Condition: stable  Code Status: Full code   History of present illness: As per the H and P dictated on admission Hospital Course:  Robin Mckinney is a 62 y.o. African-American female with medical history significant for coronary artery disease, GERD, hypertension, polysubstance abuse and gastric ulcer who presented to the emergency room with acute onset of intractable nausea and vomiting over the last couple days with associated diarrhea.  She has not been able to keep any food p.o. medications secondarily.  No alcohol intake for a month.   ED workup: Hypertensive urgency BP 203/138, hypokalemia potassium 2.8, hypochloremia 96, UDS positive for cocaine and opiates. EKG as reviewed by me : EKG showed normal sinus rhythm with sinus arrhythmia with a rate of 83, right axis deviation, minimal voltage criteria for LVH, prolonged QT interval with QTc of 554 CT a/p: 1. Evidence of gastroduodenitis.  No evidence of a perforated ulcer. 2. Nonspecific fluid filling in the pelvic small bowel which can be seen with nonspecific enteritis but there is no overt bowel  wall thickening. 3. Descending colitis versus nondistention. 4. Aortic and coronary artery atherosclerosis. 5. Small umbilical and left inguinal fat hernias. 6. 2.4 x 3.1 cm homogeneous thin walled subcutaneous fluid collection underlying the upper right groin crease, probably a seroma or liquified hematoma. This was seen previously. 7.  Aortic atherosclerosis.  The patient was given 20 mg of IV Pepcid, 25 mg of IV Benadryl, 2 mg of IV morphine sulfate, 20 mcg of IV potassium chloride, 1 L bolus of IV normal saline, 10 mg of IV hydralazine and 3.375 g of IV Zosyn. She will be admitted to a medical telemetry bed for further evaluation and management.   Assessment and Plan:  # Gastroduodenitis without bleeding S/p pantoprazole 40 mg IV twice daily and Pepcid was given during hospital stay.  Patient tolerated diet well, gradually diet was advanced from clear to full liquid and today patient received soft diet.  Denied any nausea vomiting or abdominal pain.  Patient was discharged on pantoprazole 40 mg p.o. twice daily, advised to follow-up with PCP and gradually taper off PPI and transition to Pepcid. # Enterocolitis: s/p Zosyn given during hospital stay, no more need of antibiotics.  WBC count within normal range.  Follow-up with PCP # Hypertensive urgency, secondary to her intractable nausea and vomiting and inability to keep antihypertensives.  Resumed lisinopril 20 mg p.o. daily, discontinued hydrochlorothiazide due to hypokalemia.  Patient was advised to monitor BP at home and follow with PCP to titrate medications accordingly. # Hypokalemia due to vomiting.  Potassium repleted and resolved. # Hypomagnesemia, mag repleted.  Resolved. # Hypophosphatemia, Phos repleted. # Anemia secondary to iron deficiency,  transferrin saturation 7%, s/p Venofer 200 mg IV daily x 2 doses given, started Niferex p.o. daily.  Follow with PCP to repeat iron profile after 3 to 6 months. # Vitamin B12 level 250, goal >400,  started vitamin B12 1000 mcg p.o. daily for 6 months.  Follow with PCP to repeat vitamin B12 level after 3-6 months. # Polysubstance abuse: UDS positive for cocaine and opiates.  Patient agreed that she uses cocaine and tobacco as well as occasional marijuana.  Drug abuse abstinence counseling done.  Smoking cessation counseling done.  Body mass index is 24.8 kg/m.  Nutrition Interventions:     - Patient was instructed, not to drive, operate heavy machinery, perform activities at heights, swimming or participation in water activities or provide baby sitting services while on Pain, Sleep and Anxiety Medications; until her outpatient Physician has advised to do so again.  - Also recommended to not to take more than prescribed Pain, Sleep and Anxiety Medications.  Patient was ambulatory without any assistance. On the day of the discharge the patient's vitals were stable, and no other acute medical condition were reported by patient. the patient was felt safe to be discharge at Home.  Consultants: None Procedures: Noe  Discharge Exam: General: Appear in no distress, no Rash; Oral Mucosa Clear, moist. Cardiovascular: S1 and S2 Present, no Murmur, Respiratory: normal respiratory effort, Bilateral Air entry present and no Crackles, no wheezes Abdomen: Bowel Sound present, Soft and no tenderness, no hernia Extremities: no Pedal edema, no calf tenderness Neurology: alert and oriented to time, place, and person affect appropriate.  Filed Weights   03/21/23 2033  Weight: 63.5 kg   Vitals:   03/23/23 0501 03/23/23 0737  BP: (!) 147/104 (!) 150/104  Pulse: (!) 58 69  Resp: 20 16  Temp: 98.5 F (36.9 C) 98.6 F (37 C)  SpO2: 100% 100%    DISCHARGE MEDICATION: Allergies as of 03/23/2023   No Known Allergies      Medication List     STOP taking these medications    famotidine 20 MG tablet Commonly known as: PEPCID   HYDROcodone-acetaminophen 5-325 MG tablet Commonly known as:  NORCO/VICODIN   lisinopril-hydrochlorothiazide 20-25 MG tablet Commonly known as: ZESTORETIC       TAKE these medications    aspirin EC 81 MG tablet Take 1 tablet (81 mg total) by mouth daily.   calcium carbonate 500 MG chewable tablet Commonly known as: TUMS - dosed in mg elemental calcium Chew 1-2 tablets (200-400 mg of elemental calcium total) by mouth 3 (three) times daily as needed for indigestion or heartburn.   cyanocobalamin 1000 MCG tablet Commonly known as: VITAMIN B12 Take 1 tablet (1,000 mcg total) by mouth daily.   iron polysaccharides 150 MG capsule Commonly known as: NIFEREX Take 1 capsule (150 mg total) by mouth daily. Start taking on: March 27, 2023   lisinopril 20 MG tablet Commonly known as: ZESTRIL Take 1 tablet (20 mg total) by mouth daily. Start taking on: March 24, 2023   nicotine 21 mg/24hr patch Commonly known as: NICODERM CQ - dosed in mg/24 hours Place 1 patch (21 mg total) onto the skin daily.   omeprazole 40 MG capsule Commonly known as: PRILOSEC Take 1 capsule (40 mg total) by mouth in the morning and at bedtime.   polyethylene glycol 17 g packet Commonly known as: MIRALAX / GLYCOLAX Take 17 g by mouth daily.   Vitamin D (Ergocalciferol) 1.25 MG (50000 UNIT) Caps capsule Commonly known as:  DRISDOL Take 50,000 Units by mouth every 7 (seven) days.       No Known Allergies Discharge Instructions     Call MD for:  difficulty breathing, headache or visual disturbances   Complete by: As directed    Call MD for:  extreme fatigue   Complete by: As directed    Call MD for:  persistant dizziness or light-headedness   Complete by: As directed    Call MD for:  persistant nausea and vomiting   Complete by: As directed    Call MD for:  severe uncontrolled pain   Complete by: As directed    Diet - low sodium heart healthy   Complete by: As directed    Discharge instructions   Complete by: As directed    Follow-up with PCP in 1 week,  continue to monitor BP at home and follow with PCP to titrate medication accordingly.  Repeat iron profile after 3 to 6 months.  Discontinued hydrochlorothiazide due to electrolyte imbalance.   Increase activity slowly   Complete by: As directed        The results of significant diagnostics from this hospitalization (including imaging, microbiology, ancillary and laboratory) are listed below for reference.    Significant Diagnostic Studies: CT ABDOMEN PELVIS W CONTRAST  Result Date: 03/22/2023 CLINICAL DATA:  Nausea, vomiting and abdominal pain. EXAM: CT ABDOMEN AND PELVIS WITH CONTRAST TECHNIQUE: Multidetector CT imaging of the abdomen and pelvis was performed using the standard protocol following bolus administration of intravenous contrast. RADIATION DOSE REDUCTION: This exam was performed according to the departmental dose-optimization program which includes automated exposure control, adjustment of the mA and/or kV according to patient size and/or use of iterative reconstruction technique. CONTRAST:  OMNIPAQUE IOHEXOL 300 MG/ML  SOLN COMPARISON:  CT with IV contrast 10/24/2022 and 10/19/2022. No older studies. FINDINGS: Lower chest: There is mild posterior atelectasis in the lung bases. No lung base infiltrate is seen. There is mild cardiomegaly and mild scattered three-vessel coronary artery calcifications. Hepatobiliary: No focal liver abnormality is seen. No calcified gallstones, gallbladder wall thickening, or biliary dilatation. Pancreas: No abnormality Spleen: No abnormality. Adrenals/Urinary Tract: Adrenal glands are unremarkable. Kidneys are normal, without renal calculi, focal lesion, or hydronephrosis. Bladder is unremarkable. Stomach/Bowel: The last CT demonstrated an anterior gastric antral perforation which has been repaired in the interval. There are thickened folds throughout the stomach likely reflecting diffuse gastritis. There are thickened folds in the duodenum consistent  with duodenitis. Rest of the small bowel is normal caliber and unremarkable without contrast. Nonspecific fluid filling in the pelvic small bowel is noted in can be seen with nonspecific enteritis but there is no overt wall thickening. The appendix is normal caliber. There is wall thickening versus nondistention of the descending colon. Correlate clinically for underlying colitis. The rectosigmoid segment and remaining colon are unremarkable. Vascular/Lymphatic: The abdominal aorta is tortuous, with scattered aortoiliac calcific plaques without AAA. No other superior inferior vascular findings. No adenopathy seen. Reproductive: Uterus and bilateral adnexa are unremarkable. Other: There are small umbilical and left inguinal fat hernias. There is no incarcerated hernia. There is no free fluid, free hemorrhage or free air. A homogeneous thin walled subcutaneous fluid collection underlying the upper right groin crease is again noted and again measures 2.4 x 3.1 cm and 17 Hounsfield units, probably a seroma or liquified hematoma. This was seen previously. Musculoskeletal: Degenerative change lower lumbar spine. No acute or aggressive osseous abnormality. Degenerative spurring both SI joints. IMPRESSION: 1. Evidence of  gastroduodenitis.  No evidence of a perforated ulcer. 2. Nonspecific fluid filling in the pelvic small bowel which can be seen with nonspecific enteritis but there is no overt bowel wall thickening. 3. Descending colitis versus nondistention. 4. Aortic and coronary artery atherosclerosis. 5. Small umbilical and left inguinal fat hernias. 6. 2.4 x 3.1 cm homogeneous thin walled subcutaneous fluid collection underlying the upper right groin crease, probably a seroma or liquified hematoma. This was seen previously. Aortic Atherosclerosis (ICD10-I70.0). Electronically Signed   By: Almira Bar M.D.   On: 03/22/2023 01:37   CT Head Wo Contrast  Result Date: 03/22/2023 CLINICAL DATA:  Nausea, vomiting,  diarrhea, hypertension EXAM: CT HEAD WITHOUT CONTRAST TECHNIQUE: Contiguous axial images were obtained from the base of the skull through the vertex without intravenous contrast. RADIATION DOSE REDUCTION: This exam was performed according to the departmental dose-optimization program which includes automated exposure control, adjustment of the mA and/or kV according to patient size and/or use of iterative reconstruction technique. COMPARISON:  None Available. FINDINGS: Brain: No evidence of acute infarction, hemorrhage, mass, mass effect, or midline shift. No hydrocephalus or extra-axial fluid collection. Vascular: No hyperdense vessel. Skull: Negative for fracture or focal lesion. Sinuses/Orbits: Mucosal thickening in the ethmoid air cells. No acute finding in the orbits. Other: The mastoid air cells are well aerated. IMPRESSION: No acute intracranial process. Electronically Signed   By: Wiliam Ke M.D.   On: 03/22/2023 01:16    Microbiology: No results found for this or any previous visit (from the past 240 hour(s)).   Labs: CBC: Recent Labs  Lab 03/21/23 2037 03/22/23 0449 03/23/23 0555  WBC 5.6 4.7 5.0  HGB 12.1 9.6* 9.3*  HCT 39.5 32.0* 30.6*  MCV 76.8* 79.6* 77.9*  PLT 430* 296 324   Basic Metabolic Panel: Recent Labs  Lab 03/21/23 2037 03/22/23 0449 03/23/23 0555  NA 139 141 139  K 2.8* 3.4* 3.7  CL 96* 111 110  CO2 29 21* 24  GLUCOSE 129* 86 87  BUN 12 10 10   CREATININE 0.79 0.62 0.83  CALCIUM 9.5 7.1* 8.3*  MG  --  1.5* 2.4  PHOS  --  2.5 2.2*   Liver Function Tests: Recent Labs  Lab 03/21/23 2037  AST 25  ALT 12  ALKPHOS 89  BILITOT 0.9  PROT 8.7*  ALBUMIN 4.4   Recent Labs  Lab 03/21/23 2037  LIPASE 51   No results for input(s): "AMMONIA" in the last 168 hours. Cardiac Enzymes: No results for input(s): "CKTOTAL", "CKMB", "CKMBINDEX", "TROPONINI" in the last 168 hours. BNP (last 3 results) No results for input(s): "BNP" in the last 8760  hours. CBG: No results for input(s): "GLUCAP" in the last 168 hours.  Time spent: 35 minutes  Signed:  Gillis Santa  [facility] 03/23/2023 2:39 PM

## 2023-05-05 ENCOUNTER — Emergency Department
Admission: EM | Admit: 2023-05-05 | Discharge: 2023-05-05 | Disposition: A | Discharge status: Home / Self Care | Payer: 59 | Attending: Emergency Medicine | Admitting: Emergency Medicine

## 2023-05-05 ENCOUNTER — Other Ambulatory Visit: Payer: Self-pay

## 2023-05-05 ENCOUNTER — Emergency Department: Payer: 59

## 2023-05-05 DIAGNOSIS — I10 Essential (primary) hypertension: Secondary | ICD-10-CM | POA: Diagnosis not present

## 2023-05-05 DIAGNOSIS — R109 Unspecified abdominal pain: Secondary | ICD-10-CM | POA: Diagnosis not present

## 2023-05-05 DIAGNOSIS — R1111 Vomiting without nausea: Secondary | ICD-10-CM | POA: Diagnosis not present

## 2023-05-05 DIAGNOSIS — R112 Nausea with vomiting, unspecified: Secondary | ICD-10-CM

## 2023-05-05 DIAGNOSIS — R1033 Periumbilical pain: Secondary | ICD-10-CM | POA: Diagnosis not present

## 2023-05-05 DIAGNOSIS — R1084 Generalized abdominal pain: Secondary | ICD-10-CM | POA: Diagnosis not present

## 2023-05-05 DIAGNOSIS — Z743 Need for continuous supervision: Secondary | ICD-10-CM | POA: Diagnosis not present

## 2023-05-05 DIAGNOSIS — K429 Umbilical hernia without obstruction or gangrene: Secondary | ICD-10-CM | POA: Diagnosis not present

## 2023-05-05 DIAGNOSIS — R110 Nausea: Secondary | ICD-10-CM | POA: Diagnosis not present

## 2023-05-05 LAB — CBC
HCT: 42.7 % (ref 36.0–46.0)
Hemoglobin: 13.2 g/dL (ref 12.0–15.0)
MCH: 25 pg — ABNORMAL LOW (ref 26.0–34.0)
MCHC: 30.9 g/dL (ref 30.0–36.0)
MCV: 80.7 fL (ref 80.0–100.0)
Platelets: 387 10*3/uL (ref 150–400)
RBC: 5.29 MIL/uL — ABNORMAL HIGH (ref 3.87–5.11)
RDW: 19.8 % — ABNORMAL HIGH (ref 11.5–15.5)
WBC: 7 10*3/uL (ref 4.0–10.5)
nRBC: 0 % (ref 0.0–0.2)

## 2023-05-05 LAB — COMPREHENSIVE METABOLIC PANEL
ALT: 12 U/L (ref 0–44)
AST: 20 U/L (ref 15–41)
Albumin: 4.6 g/dL (ref 3.5–5.0)
Alkaline Phosphatase: 76 U/L (ref 38–126)
Anion gap: 11 (ref 5–15)
BUN: 29 mg/dL — ABNORMAL HIGH (ref 8–23)
CO2: 27 mmol/L (ref 22–32)
Calcium: 9.5 mg/dL (ref 8.9–10.3)
Chloride: 99 mmol/L (ref 98–111)
Creatinine, Ser: 1.01 mg/dL — ABNORMAL HIGH (ref 0.44–1.00)
GFR, Estimated: >60 mL/min (ref >60)
Glucose, Bld: 128 mg/dL — ABNORMAL HIGH (ref 70–99)
Potassium: 3.5 mmol/L (ref 3.5–5.1)
Sodium: 137 mmol/L (ref 135–145)
Total Bilirubin: 0.4 mg/dL (ref 0.3–1.2)
Total Protein: 8.4 g/dL — ABNORMAL HIGH (ref 6.5–8.1)

## 2023-05-05 LAB — LIPASE, BLOOD: Lipase: 33 U/L (ref 11–51)

## 2023-05-05 MED ORDER — IOHEXOL 300 MG/ML  SOLN
100.0000 mL | Freq: Once | INTRAMUSCULAR | Status: AC | PRN
  Administered 2023-05-05: 100 mL via INTRAVENOUS

## 2023-05-05 MED ORDER — ONDANSETRON 4 MG PO TBDP: ORAL_TABLET | ORAL | 0 refills | Status: DC

## 2023-05-05 NOTE — ED Triage Notes (Signed)
Pt to ed from home via [facility] for abd pain x 2 days. Pt has HX of abd surgery in feb. Pt is caox4, in no acute distress and in a wheel chair in triage. Pt BP was elevated for EMS. Pt has not had her morning dose yet as its not due til 7am.

## 2023-05-05 NOTE — ED Notes (Signed)
Patient noted to be laying on the floor.  Checked on patient and encouraged patient up into chair with blankets for comfort.  Update given to patient.

## 2023-05-05 NOTE — ED Provider Notes (Signed)
[facility] Provider Note    Event Date/Time   First MD Initiated Contact with Patient 05/05/23 0327     (approximate)   History   Abdominal Pain   HPI Robin Mckinney is a 62 y.o. female who has a history of gastroduodenitis who has had a surgery for her ulcer in the past.  She also has a history of polysubstance abuse but states that she stopped using cocaine and alcohol after her last hospitalization about a month ago.  She presents tonight for acute onset abdominal pain around her bellybutton that started about 2 days ago.  She has had nausea and vomiting.  She said that her blood pressure is always high and she has not had her morning dose yet but she did take a dose last night.  She denies any chest pain or shortness of breath.  She states that the pain feels just like it did before.  She has not had any fever, chills, or pain when she urinates.  She is not having any diarrhea and has had normal bowel movements.     Physical Exam   Triage Vital Signs: ED Triage Vitals  Encounter Vitals Group     BP 05/05/23 0136 (S) (!) 180/160     Systolic BP Percentile --      Diastolic BP Percentile --      Pulse Rate 05/05/23 0136 86     Resp 05/05/23 0136 16     Temp 05/05/23 0136 98.6 F (37 C)     Temp Source 05/05/23 0136 Oral     SpO2 05/05/23 0136 98 %     Weight --      Height 05/05/23 0133 1.6 m (5\' 3" )     Head Circumference --      Peak Flow --      Pain Score 05/05/23 0136 8     Pain Loc --      Pain Education --      Exclude from Growth Chart --     Most recent vital signs: Vitals:   05/05/23 0500 05/05/23 0545  BP: (!) 198/111   Pulse: 85 83  Resp:  (!) 22  Temp:    SpO2: 98% 96%    General: Sleeping comfortably, but awakens easily to soft voice.  Appears to have a degree of chronic illness but is not in obvious distress at this time. CV:  Good peripheral perfusion.  Regular rate and rhythm. Resp:  Normal effort. Speaking easily  and comfortably, no accessory muscle usage nor intercostal retractions.   Abd:  No distention.  Patient reports tenderness to palpation around the umbilicus but not in the epigastrium.  No rebound but some guarding.  No pulsatile abdominal masses.   ED Results / Procedures / Treatments   Labs (all labs ordered are listed, but only abnormal results are displayed) Labs Reviewed  COMPREHENSIVE METABOLIC PANEL - Abnormal; Notable for the following components:      Result Value   Glucose, Bld 128 (*)    BUN 29 (*)    Creatinine, Ser 1.01 (*)    Total Protein 8.4 (*)    All other components within normal limits  CBC - Abnormal; Notable for the following components:   RBC 5.29 (*)    MCH 25.0 (*)    RDW 19.8 (*)    All other components within normal limits  LIPASE, BLOOD     EKG  ED ECG REPORT I, Loleta Rose, the  attending physician, personally viewed and interpreted this ECG.  Date: 05/05/2023 EKG Time: 3:55 AM Rate: 77 Rhythm: normal sinus rhythm QRS Axis: normal Intervals: normal ST/T Wave abnormalities: Non-specific ST segment / T-wave changes, but no clear evidence of acute ischemia. Narrative Interpretation: no definitive evidence of acute ischemia; does not meet STEMI criteria.    RADIOLOGY I viewed and interpreted the patient's CT of the abdomen and pelvis and I can see no evidence of acute abnormality.  The radiologist confirmed that there is some chronic changes but nothing acute.   PROCEDURES:  Critical Care performed: No  .1-3 Lead EKG Interpretation  Performed by: Loleta Rose, MD Authorized by: Loleta Rose, MD     Interpretation: normal     ECG rate:  80   ECG rate assessment: normal     Rhythm: sinus rhythm     Ectopy: none     Conduction: normal       IMPRESSION / MDM / ASSESSMENT AND PLAN / ED COURSE  I reviewed the triage vital signs and the nursing notes.                              Differential diagnosis includes, but is not  limited to, hypertension, acute on chronic pain, SBO/ileus, ulcer, hemopneumoperitoneum, biliary colic.  Patient's presentation is most consistent with acute presentation with potential threat to life or bodily function.  Labs/studies ordered: CT of the abdomen and pelvis, CBC, lipase, CMP  Interventions/Medications given:  Medications  iohexol (OMNIPAQUE) 300 MG/ML solution 100 mL (100 mLs Intravenous Contrast Given 05/05/23 0507)    (Note:  hospital course my include additional interventions and/or labs/studies not listed above.)   Patient has uncontrolled hypertension but I looked back through her prior visits and her blood pressure is similar tonight to what it has been in the past.  She has a primary care provider and has blood pressure medicine.  There is no indication she is having an emergent consequence of her poorly controlled blood pressure and I talked with her about following up with her primary care doctor to address the blood pressure issue.  Tonight her evaluation has been very reassuring including her lab work and EKG.  Similarly her CT scan did not show any evidence of an acute or emergent abnormality such as a bowel obstruction or indication that she requires surgical intervention.  I provided reassurance to the patient who has been resting comfortably.  I encouraged the use of over-the-counter pain medication and the patient will follow-up with her regular doctors as an outpatient.  I gave her reassurance and recommended continuing her regular medications including her PPI.  The patient is on the cardiac monitor to evaluate for evidence of arrhythmia and/or significant heart rate changes.       FINAL CLINICAL IMPRESSION(S) / ED DIAGNOSES   Final diagnoses:  Periumbilical abdominal pain  Nausea and vomiting, unspecified vomiting type     Rx / DC Orders   ED Discharge Orders          Ordered    ondansetron (ZOFRAN-ODT) 4 MG disintegrating tablet        05/05/23  0553             Note:  This document was prepared using Dragon voice recognition software and may include unintentional dictation errors.   Loleta Rose, MD 05/05/23 [phone]

## 2023-05-05 NOTE — Discharge Instructions (Signed)

## 2023-06-27 IMAGING — MG MM DIGITAL SCREENING BILAT W/ TOMO AND CAD
6 of 12 series · 6 of 36 positions shown · non-contrast
Comparison: Previous exam(s).

CLINICAL DATA: Screening.

EXAM:
DIGITAL SCREENING BILATERAL MAMMOGRAM WITH TOMOSYNTHESIS AND CAD
TECHNIQUE: Bilateral screening digital craniocaudal and mediolateral oblique
mammograms were obtained. Bilateral screening digital breast
tomosynthesis was performed. The images were evaluated with
computer-aided detection.

[L CC synth-2D (1 of 2)]
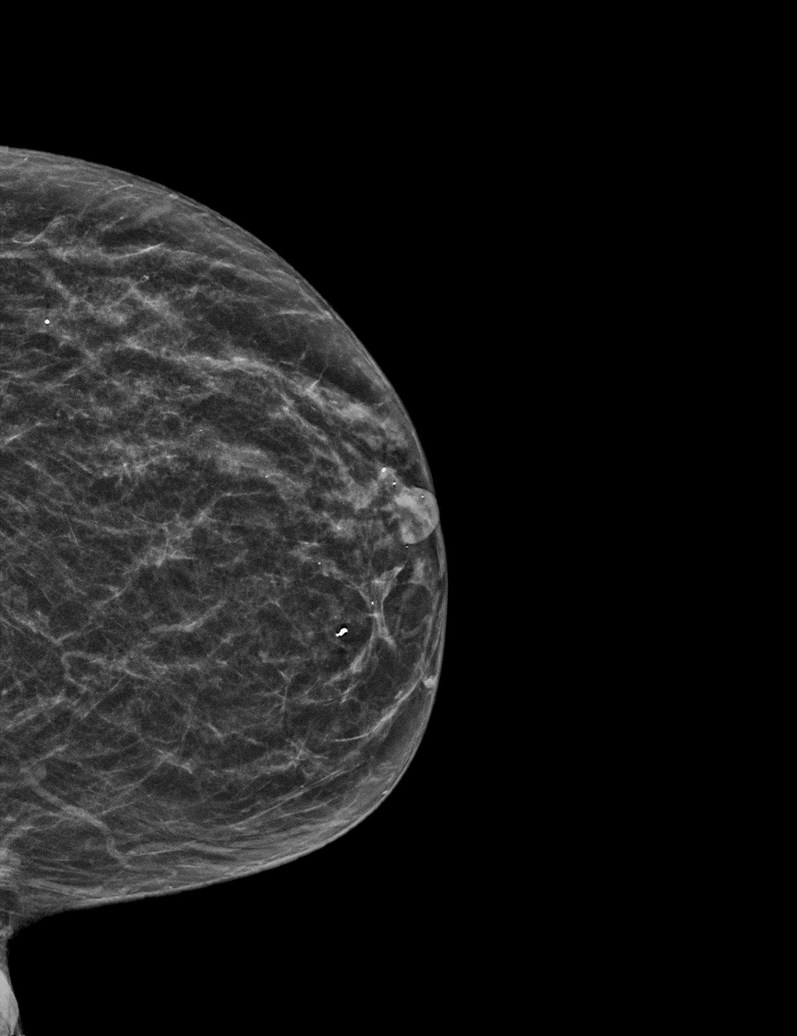

[R MLO synth-2D (1 of 2)]
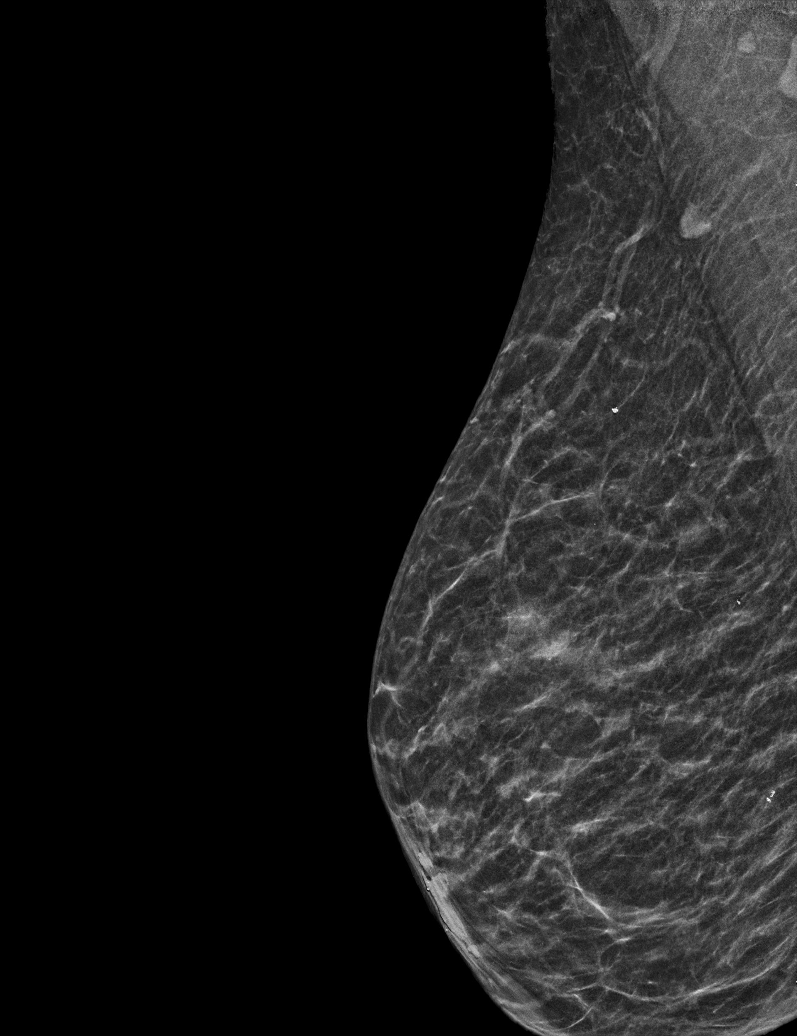

[L MLO synth-2D]
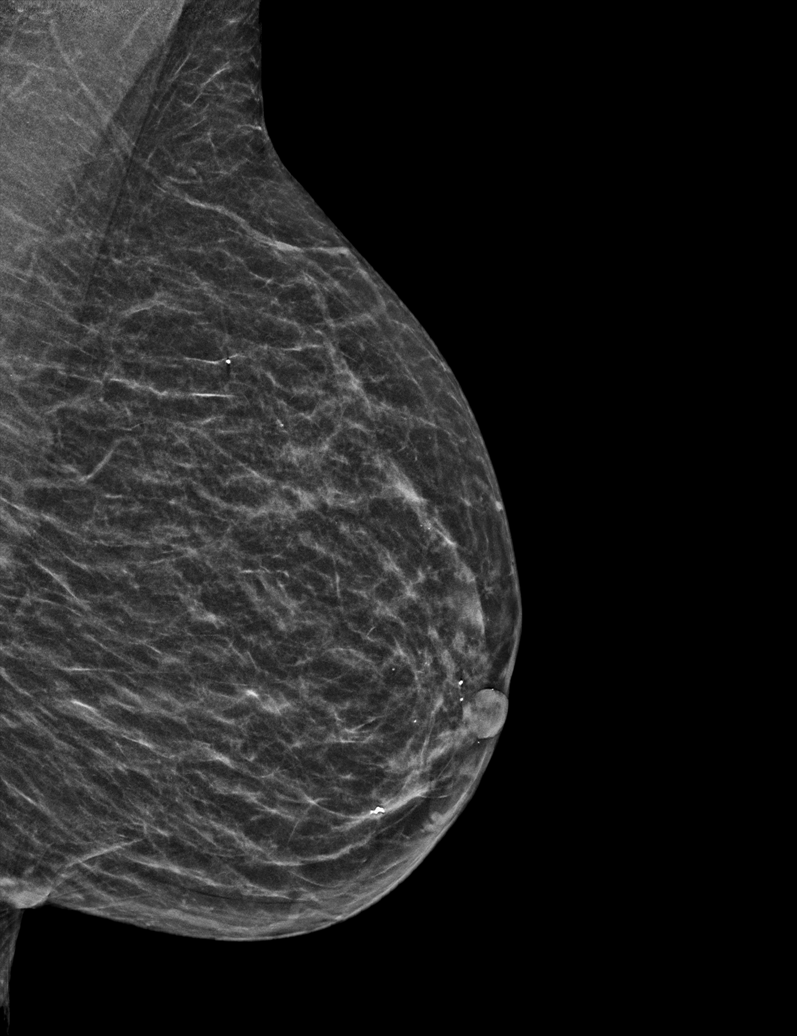

[R CC synth-2D]
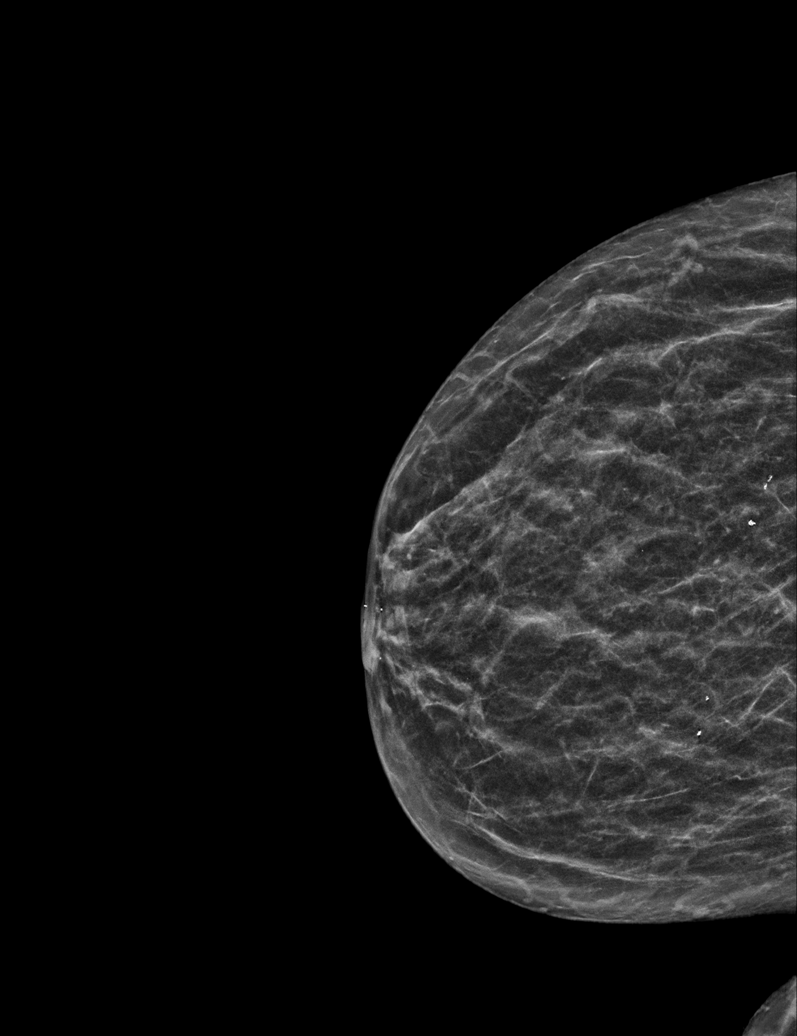

[R MLO synth-2D (2 of 2)]
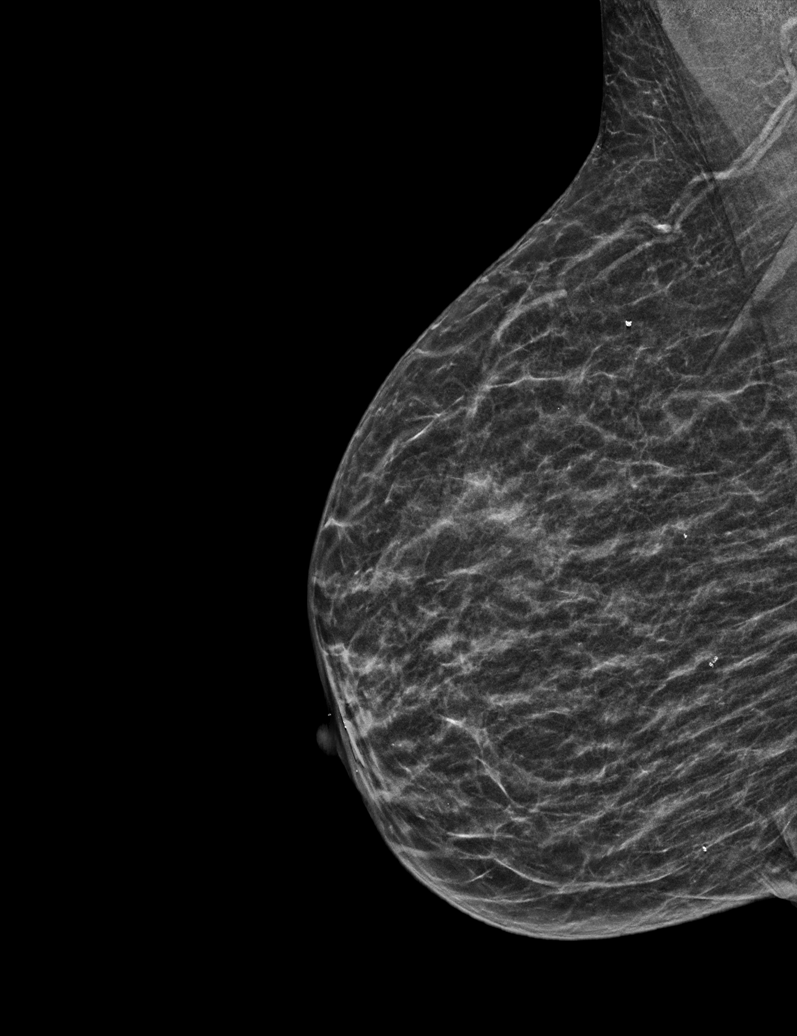

[L CC synth-2D (2 of 2)]
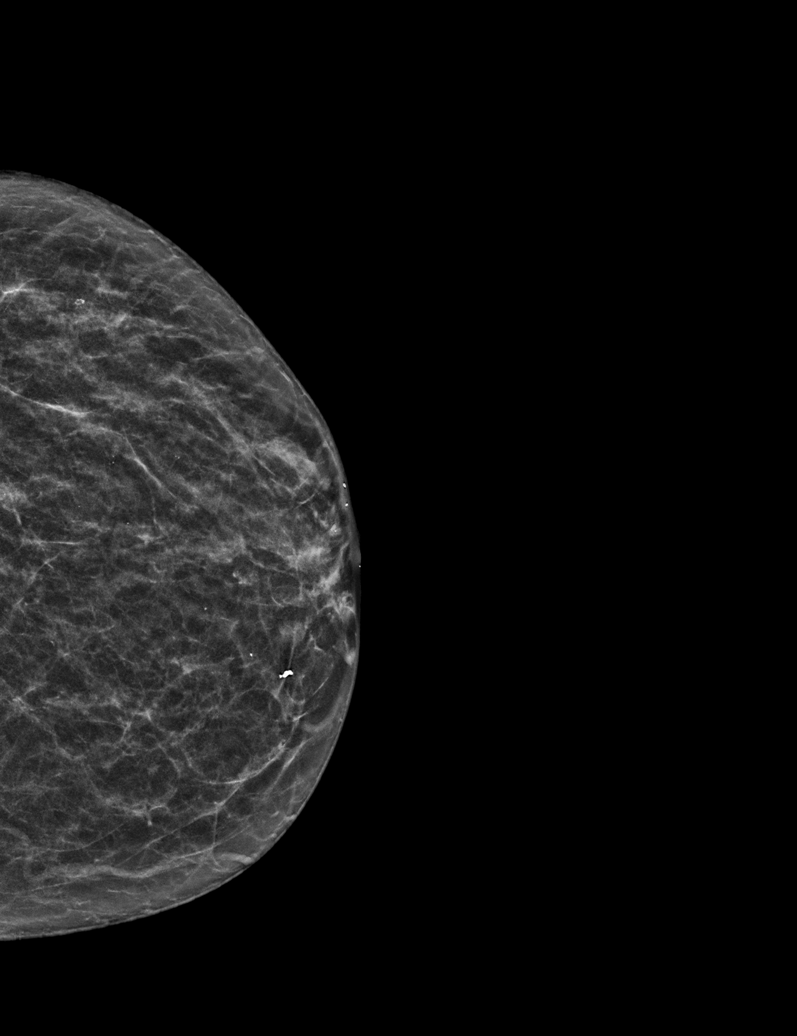

[6 of 36 positions shown; findings below may reference images not displayed]

ACR Breast Density Category b: There are scattered areas of
fibroglandular density.
FINDINGS: There are no findings suspicious for malignancy.
IMPRESSION: No mammographic evidence of malignancy. A result letter of this
screening mammogram will be mailed directly to the patient.

RECOMMENDATION:
Screening mammogram in one year. (Code:51-O-LD2)

BI-RADS CATEGORY  1: Negative.

## 2023-11-08 ENCOUNTER — Inpatient Hospital Stay
Admission: EM | Admit: 2023-11-08 | Discharge: 2023-11-10 | DRG: 682 | Disposition: A | Discharge status: Home / Self Care | Payer: 59 | Attending: Internal Medicine | Admitting: Internal Medicine

## 2023-11-08 ENCOUNTER — Emergency Department: Payer: 59

## 2023-11-08 ENCOUNTER — Encounter: Payer: Self-pay | Admitting: Emergency Medicine

## 2023-11-08 ENCOUNTER — Other Ambulatory Visit: Payer: Self-pay

## 2023-11-08 DIAGNOSIS — Z720 Tobacco use: Secondary | ICD-10-CM

## 2023-11-08 DIAGNOSIS — N179 Acute kidney failure, unspecified: Secondary | ICD-10-CM | POA: Diagnosis present

## 2023-11-08 DIAGNOSIS — Z8711 Personal history of peptic ulcer disease: Secondary | ICD-10-CM

## 2023-11-08 DIAGNOSIS — F1410 Cocaine abuse, uncomplicated: Secondary | ICD-10-CM | POA: Diagnosis present

## 2023-11-08 DIAGNOSIS — G9340 Encephalopathy, unspecified: Secondary | ICD-10-CM

## 2023-11-08 DIAGNOSIS — F1210 Cannabis abuse, uncomplicated: Secondary | ICD-10-CM | POA: Diagnosis present

## 2023-11-08 DIAGNOSIS — Z7982 Long term (current) use of aspirin: Secondary | ICD-10-CM

## 2023-11-08 DIAGNOSIS — I10 Essential (primary) hypertension: Secondary | ICD-10-CM | POA: Diagnosis present

## 2023-11-08 DIAGNOSIS — R0789 Other chest pain: Secondary | ICD-10-CM | POA: Diagnosis not present

## 2023-11-08 DIAGNOSIS — K4090 Unilateral inguinal hernia, without obstruction or gangrene, not specified as recurrent: Secondary | ICD-10-CM | POA: Diagnosis not present

## 2023-11-08 DIAGNOSIS — I2510 Atherosclerotic heart disease of native coronary artery without angina pectoris: Secondary | ICD-10-CM | POA: Diagnosis present

## 2023-11-08 DIAGNOSIS — G928 Other toxic encephalopathy: Secondary | ICD-10-CM | POA: Diagnosis present

## 2023-11-08 DIAGNOSIS — W19XXXA Unspecified fall, initial encounter: Secondary | ICD-10-CM | POA: Diagnosis present

## 2023-11-08 DIAGNOSIS — R079 Chest pain, unspecified: Secondary | ICD-10-CM | POA: Diagnosis not present

## 2023-11-08 DIAGNOSIS — F1910 Other psychoactive substance abuse, uncomplicated: Secondary | ICD-10-CM | POA: Diagnosis present

## 2023-11-08 DIAGNOSIS — Z79899 Other long term (current) drug therapy: Secondary | ICD-10-CM | POA: Diagnosis not present

## 2023-11-08 DIAGNOSIS — N390 Urinary tract infection, site not specified: Secondary | ICD-10-CM | POA: Insufficient documentation

## 2023-11-08 DIAGNOSIS — F1920 Other psychoactive substance dependence, uncomplicated: Secondary | ICD-10-CM

## 2023-11-08 DIAGNOSIS — N170 Acute kidney failure with tubular necrosis: Principal | ICD-10-CM | POA: Diagnosis present

## 2023-11-08 DIAGNOSIS — Z1152 Encounter for screening for COVID-19: Secondary | ICD-10-CM | POA: Diagnosis not present

## 2023-11-08 DIAGNOSIS — F17210 Nicotine dependence, cigarettes, uncomplicated: Secondary | ICD-10-CM | POA: Diagnosis present

## 2023-11-08 DIAGNOSIS — Z8249 Family history of ischemic heart disease and other diseases of the circulatory system: Secondary | ICD-10-CM | POA: Diagnosis not present

## 2023-11-08 DIAGNOSIS — E860 Dehydration: Secondary | ICD-10-CM | POA: Diagnosis present

## 2023-11-08 DIAGNOSIS — K219 Gastro-esophageal reflux disease without esophagitis: Secondary | ICD-10-CM | POA: Diagnosis present

## 2023-11-08 DIAGNOSIS — S3991XA Unspecified injury of abdomen, initial encounter: Secondary | ICD-10-CM | POA: Diagnosis not present

## 2023-11-08 DIAGNOSIS — Y92009 Unspecified place in unspecified non-institutional (private) residence as the place of occurrence of the external cause: Secondary | ICD-10-CM

## 2023-11-08 DIAGNOSIS — R519 Headache, unspecified: Secondary | ICD-10-CM | POA: Diagnosis not present

## 2023-11-08 LAB — CBC WITH DIFFERENTIAL/PLATELET
Abs Immature Granulocytes: 0.02 10*3/uL (ref 0.00–0.07)
Basophils Absolute: 0 10*3/uL (ref 0.0–0.1)
Basophils Relative: 0 %
Eosinophils Absolute: 0 10*3/uL (ref 0.0–0.5)
Eosinophils Relative: 0 %
HCT: 42.2 % (ref 36.0–46.0)
Hemoglobin: 13.7 g/dL (ref 12.0–15.0)
Immature Granulocytes: 0 %
Lymphocytes Relative: 27 %
Lymphs Abs: 1.9 10*3/uL (ref 0.7–4.0)
MCH: 27.7 pg (ref 26.0–34.0)
MCHC: 32.5 g/dL (ref 30.0–36.0)
MCV: 85.4 fL (ref 80.0–100.0)
Monocytes Absolute: 0.7 10*3/uL (ref 0.1–1.0)
Monocytes Relative: 10 %
Neutro Abs: 4.3 10*3/uL (ref 1.7–7.7)
Neutrophils Relative %: 63 %
Platelets: 291 10*3/uL (ref 150–400)
RBC: 4.94 MIL/uL (ref 3.87–5.11)
RDW: 15.6 % — ABNORMAL HIGH (ref 11.5–15.5)
WBC: 6.9 10*3/uL (ref 4.0–10.5)
nRBC: 0 % (ref 0.0–0.2)

## 2023-11-08 LAB — BASIC METABOLIC PANEL
Anion gap: 20 — ABNORMAL HIGH (ref 5–15)
Anion gap: 16 — ABNORMAL HIGH (ref 5–15)
BUN: 66 mg/dL — ABNORMAL HIGH (ref 8–23)
BUN: 68 mg/dL — ABNORMAL HIGH (ref 8–23)
CO2: 22 mmol/L (ref 22–32)
CO2: 24 mmol/L (ref 22–32)
Calcium: 8.4 mg/dL — ABNORMAL LOW (ref 8.9–10.3)
Calcium: 8.5 mg/dL — ABNORMAL LOW (ref 8.9–10.3)
Chloride: 92 mmol/L — ABNORMAL LOW (ref 98–111)
Chloride: 95 mmol/L — ABNORMAL LOW (ref 98–111)
Creatinine, Ser: 5.87 mg/dL — ABNORMAL HIGH (ref 0.44–1.00)
Creatinine, Ser: 4.13 mg/dL — ABNORMAL HIGH (ref 0.44–1.00)
GFR, Estimated: 8 mL/min — ABNORMAL LOW (ref >60)
GFR, Estimated: 12 mL/min — ABNORMAL LOW (ref >60)
Glucose, Bld: 94 mg/dL (ref 70–99)
Glucose, Bld: 101 mg/dL — ABNORMAL HIGH (ref 70–99)
Potassium: 3.9 mmol/L (ref 3.5–5.1)
Potassium: 3.7 mmol/L (ref 3.5–5.1)
Sodium: 134 mmol/L — ABNORMAL LOW (ref 135–145)
Sodium: 135 mmol/L (ref 135–145)

## 2023-11-08 LAB — URINE DRUG SCREEN, QUALITATIVE (ARMC ONLY)
Amphetamines, Ur Screen: NOT DETECTED
Barbiturates, Ur Screen: NOT DETECTED
Benzodiazepine, Ur Scrn: NOT DETECTED
Cannabinoid 50 Ng, Ur ~~LOC~~: NOT DETECTED
Cocaine Metabolite,Ur ~~LOC~~: POSITIVE — AB
MDMA (Ecstasy)Ur Screen: NOT DETECTED
Methadone Scn, Ur: NOT DETECTED
Opiate, Ur Screen: NOT DETECTED
Phencyclidine (PCP) Ur S: NOT DETECTED
Tricyclic, Ur Screen: NOT DETECTED

## 2023-11-08 LAB — URINALYSIS, ROUTINE W REFLEX MICROSCOPIC
Bilirubin Urine: NEGATIVE
Glucose, UA: NEGATIVE mg/dL
Ketones, ur: NEGATIVE mg/dL
Nitrite: NEGATIVE
Protein, ur: 100 mg/dL — AB
Specific Gravity, Urine: 1.02 (ref 1.005–1.030)
pH: 5 (ref 5.0–8.0)

## 2023-11-08 LAB — HEPATIC FUNCTION PANEL
ALT: 13 U/L (ref 0–44)
AST: 23 U/L (ref 15–41)
Albumin: 3.8 g/dL (ref 3.5–5.0)
Alkaline Phosphatase: 67 U/L (ref 38–126)
Bilirubin, Direct: <0.1 mg/dL (ref 0.0–0.2)
Total Bilirubin: 1 mg/dL (ref 0.0–1.2)
Total Protein: 7.5 g/dL (ref 6.5–8.1)

## 2023-11-08 LAB — SODIUM, URINE, RANDOM: Sodium, Ur: 48 mmol/L

## 2023-11-08 LAB — GLUCOSE, CAPILLARY: Glucose-Capillary: 128 mg/dL — ABNORMAL HIGH (ref 70–99)

## 2023-11-08 LAB — HIV ANTIBODY (ROUTINE TESTING W REFLEX): HIV Screen 4th Generation wRfx: NONREACTIVE

## 2023-11-08 LAB — CREATININE, URINE, RANDOM: Creatinine, Urine: 263 mg/dL

## 2023-11-08 MED ORDER — METOCLOPRAMIDE HCL 5 MG/ML IJ SOLN
10.0000 mg | INTRAMUSCULAR | Status: AC
  Administered 2023-11-08: 10 mg via INTRAVENOUS
  Filled 2023-11-08: qty 2

## 2023-11-08 MED ORDER — ACETAMINOPHEN 325 MG PO TABS
650.0000 mg | ORAL_TABLET | Freq: Four times a day (QID) | ORAL | Status: DC | PRN
  Administered 2023-11-08 – 2023-11-10 (×4): 650 mg via ORAL
  Filled 2023-11-08 (×4): qty 2

## 2023-11-08 MED ORDER — METHYLPREDNISOLONE SODIUM SUCC 125 MG IJ SOLR
60.0000 mg | INTRAMUSCULAR | Status: AC
  Administered 2023-11-08: 60 mg via INTRAVENOUS
  Filled 2023-11-08: qty 2

## 2023-11-08 MED ORDER — SODIUM CHLORIDE 0.9 % IV SOLN
1.0000 g | INTRAVENOUS | Status: AC
  Administered 2023-11-08: 1 g via INTRAVENOUS
  Filled 2023-11-08: qty 10

## 2023-11-08 MED ORDER — HEPARIN SODIUM (PORCINE) 5000 UNIT/ML IJ SOLN
5000.0000 [IU] | Freq: Three times a day (TID) | INTRAMUSCULAR | Status: DC
  Administered 2023-11-08 – 2023-11-10 (×7): 5000 [IU] via SUBCUTANEOUS
  Filled 2023-11-08 (×7): qty 1

## 2023-11-08 MED ORDER — ONDANSETRON HCL 4 MG/2ML IJ SOLN
4.0000 mg | Freq: Four times a day (QID) | INTRAMUSCULAR | Status: DC | PRN
  Administered 2023-11-10: 4 mg via INTRAVENOUS
  Filled 2023-11-08: qty 2

## 2023-11-08 MED ORDER — SODIUM CHLORIDE 0.9 % IV SOLN: INTRAVENOUS | Status: AC

## 2023-11-08 MED ORDER — SODIUM CHLORIDE 0.9 % IV BOLUS
1000.0000 mL | Freq: Once | INTRAVENOUS | Status: AC
  Administered 2023-11-08: 1000 mL via INTRAVENOUS

## 2023-11-08 MED ORDER — CEFTRIAXONE SODIUM 1 G IJ SOLR
1.0000 g | INTRAMUSCULAR | Status: DC
  Administered 2023-11-09 – 2023-11-10 (×2): 1 g via INTRAVENOUS
  Filled 2023-11-08 (×2): qty 10

## 2023-11-08 MED ORDER — ONDANSETRON HCL 4 MG PO TABS: 4.0000 mg | ORAL_TABLET | Freq: Four times a day (QID) | ORAL | Status: DC | PRN

## 2023-11-08 NOTE — Consult Note (Signed)
[facility] Consult Note:target organ damage     Date of Admission:  11/08/2023           Reason for Consult:  AKI   Referring Provider: Floydene Flock, MD Primary Care Provider: [facility]   History of Presenting Illness:  Robin Mckinney is a 63 y.o. female with medical problems of polysubstance abuse, coronary artery disease, hypertension, tobacco abuse. All information is obtained from the chart.  Patient did not wake up to provide any information. She presented to the emergency room [facility] from work for complaints of left-sided headache which was not relieved with Tylenol.  She also reported that she had a fall at home 3 days ago and gets dizzy with standing.  Workup in the emergency room showed severely elevated creatinine of 5.87.  Baseline creatinine of 1.01 from July 2024.  Hemoglobin is normal.  Platelet count is normal.  Urinalysis with moderate blood, moderate leukocytes, 21-50 RBCs, 11-20 WBCs.  Urine drug screen is positive for cocaine.  Urine culture is pending.  CT scan of the abdomen and pelvis without contrast is negative for any traumatic injury to the abdomen or pelvis.  Kidney imaging is unremarkable.  No stones noted. Respiratory panel including influenza A/B, RSV, SARS-CoV-2 is negative.   Review of Systems: ROS-not available due to patient being extremely somnolent when examined  Past Medical History:  Diagnosis Date   Acute gastric ulcer with perforation (HCC) 10/24/2022   CAD (coronary artery disease)    GERD (gastroesophageal reflux disease)    H/O cesarean section    Hypertension    Tobacco abuse     Social History   Tobacco Use   Smoking status: Every Day    Current packs/day: 0.50    Types: Cigarettes   Smokeless tobacco: Never  Vaping Use   Vaping status: Never Used  Substance Use Topics   Alcohol use: Not Currently   Drug use: Yes    Types: Codeine    Family History  Problem  Relation Age of Onset   Hypertension Mother    Hypertension Father      OBJECTIVE: Blood pressure 109/79, pulse 66, temperature 98.5 F (36.9 C), temperature source Oral, resp. rate 16, height 5\' 3"  (1.6 m), weight 65.8 kg, SpO2 96%.  Physical Exam General Appearance-no acute distress, laying in the bed HEENT-moist oral mucous membranes Neck-no masses Pulmonary: Normal breathing effort Cardiac-regular rhythm Abdomen-mild diffuse tenderness especially in the lower abdomen. Extremities-no peripheral edema Neuro-very somnolent.  Did not wake up to interact or follow commands.   Lab Results Lab Results  Component Value Date   WBC 6.9 11/08/2023   HGB 13.7 11/08/2023   HCT 42.2 11/08/2023   MCV 85.4 11/08/2023   PLT 291 11/08/2023    Lab Results  Component Value Date   CREATININE 5.87 (H) 11/08/2023   BUN 66 (H) 11/08/2023   NA 134 (L) 11/08/2023   K 3.9 11/08/2023   CL 92 (L) 11/08/2023   CO2 22 11/08/2023    Lab Results  Component Value Date   ALT 12 05/05/2023   AST 20 05/05/2023   ALKPHOS 76 05/05/2023   BILITOT 0.4 05/05/2023     Microbiology: No results found for this or any previous visit (from the past 240 hours).  Medications: Scheduled Meds:  heparin  5,000 Units Subcutaneous Q8H   Continuous Infusions:  sodium chloride 125 mL/hr at 11/08/23 1220   PRN Meds:.ondansetron **OR** ondansetron (ZOFRAN) IV  No Known Allergies  Urinalysis: Recent Labs    11/08/23 0832  COLORURINE YELLOW*  LABSPEC 1.020  PHURINE 5.0  GLUCOSEU NEGATIVE  HGBUR MODERATE*  BILIRUBINUR NEGATIVE  KETONESUR NEGATIVE  PROTEINUR 100*  NITRITE NEGATIVE  LEUKOCYTESUR MODERATE*      Imaging: CT ABDOMEN PELVIS WO CONTRAST Result Date: 11/08/2023 CLINICAL DATA:  Abdominal trauma, blunt.  Headache. EXAM: CT ABDOMEN AND PELVIS WITHOUT CONTRAST TECHNIQUE: Multidetector CT imaging of the abdomen and pelvis was performed following the standard protocol without IV contrast.  RADIATION DOSE REDUCTION: This exam was performed according to the departmental dose-optimization program which includes automated exposure control, adjustment of the mA and/or kV according to patient size and/or use of iterative reconstruction technique. COMPARISON:  CT scan abdomen and pelvis from 05/05/2023. FINDINGS: Lower chest: There are patchy atelectatic changes in the visualized lung bases. No overt consolidation. No pleural effusion. The heart is normal in size. No pericardial effusion. Hepatobiliary: The liver is normal in size. Non-cirrhotic configuration. No suspicious mass. No intrahepatic or extrahepatic bile duct dilation. No calcified gallstones. Normal gallbladder wall thickness. No pericholecystic inflammatory changes. Pancreas: Unremarkable. No pancreatic ductal dilatation or surrounding inflammatory changes. Spleen: Within normal limits. No focal lesion. Adrenals/Urinary Tract: Adrenal glands are unremarkable. No suspicious renal mass. No hydronephrosis. No renal or ureteric calculi. Unremarkable urinary bladder. Stomach/Bowel: No disproportionate dilation of the small or large bowel loops. No evidence of abnormal bowel wall thickening or inflammatory changes. The appendix is unremarkable. Vascular/Lymphatic: No ascites or pneumoperitoneum. No abdominal or pelvic lymphadenopathy, by size criteria. No aneurysmal dilation of the major abdominal arteries. There are mild peripheral atherosclerotic vascular calcifications of the aorta and its major branches. Reproductive: The uterus is unremarkable. No large adnexal mass. Other: There are small fat containing umbilical and left inguinal hernias. The soft tissues and abdominal wall are otherwise unremarkable. Musculoskeletal: No suspicious osseous lesions. There are mild multilevel degenerative changes in the visualized spine. IMPRESSION: *No traumatic injury to the abdomen or pelvis. *Multiple other nonacute observations, as described above. Aortic  Atherosclerosis (ICD10-I70.0). Electronically Signed   By: Jules Schick M.D.   On: 11/08/2023 09:12   CT HEAD WO CONTRAST ( ) Result Date: 11/08/2023 CLINICAL DATA:  Sudden and severe headache EXAM: CT HEAD WITHOUT CONTRAST TECHNIQUE: Contiguous axial images were obtained from the base of the skull through the vertex without intravenous contrast. RADIATION DOSE REDUCTION: This exam was performed according to the departmental dose-optimization program which includes automated exposure control, adjustment of the mA and/or kV according to patient size and/or use of iterative reconstruction technique. COMPARISON:  03/22/2023 FINDINGS: Brain: No evidence of acute infarction, hemorrhage, hydrocephalus, extra-axial collection or mass lesion/mass effect. Vascular: No hyperdense vessel or unexpected calcification. Skull: Normal. Negative for fracture or focal lesion. Sinuses/Orbits: No acute finding. IMPRESSION: Negative head CT. Electronically Signed   By: Tiburcio Pea M.D.   On: 11/08/2023 09:06   DG Chest 1 View Result Date: 11/08/2023 CLINICAL DATA:  63 year old female with history of right-sided chest pain after a fall. EXAM: CHEST  1 VIEW COMPARISON:  Chest x-ray 10/24/2022. FINDINGS: Lung volumes are normal. No consolidative airspace disease. No pleural effusions. No pneumothorax. No pulmonary nodule or mass noted. Pulmonary vasculature and the cardiomediastinal silhouette are within normal limits. IMPRESSION: No radiographic evidence of acute cardiopulmonary disease. Electronically Signed   By: Trudie Reed M.D.   On: 11/08/2023 08:12      Assessment/Plan:  CERA RORKE is a 63 y.o. female with medical problems of hypertension,  substance abuse-tobacco, marijuana, cocaine, coronary disease, history of GERD, gastric ulcer was admitted on 11/08/2023 for :  AKI (acute kidney injury) (HCC) [N17.9]  Acute kidney injury Likely ATN from volume depletion/dehydration.  She also appears to have  a UTI.  Urine culture is pending. -Agree with IV hydration.  Currently on normal saline 125 cc/h -Agree with IV antibiotics empirically. -Blood pressure control is acceptable at present without any antihypertensives. -No acute indication for dialysis at present.  We will follow along with you during hospitalization.  Robin Mckinney Thedore Mins 11/08/23

## 2023-11-08 NOTE — ED Notes (Signed)
Pt was able to walk to the restroom and back w/o assistance.  NAD noted.

## 2023-11-08 NOTE — ED Notes (Signed)
Rocephin had stopped infusing due to patient's position. Patient was repositioned so medication would infuse through IV. Patient woke easily when moved.

## 2023-11-08 NOTE — ED Notes (Signed)
Report given to Alaina RN

## 2023-11-08 NOTE — Assessment & Plan Note (Signed)
LLN BP on presentation  Clinically dry  Hold BP regimen for now  Monitor w/ hydration

## 2023-11-08 NOTE — Assessment & Plan Note (Signed)
+   fall several days ago w/ general R sided pain  Plain films and imaging stable  Non narcotic pain control  Monitor

## 2023-11-08 NOTE — ED Triage Notes (Signed)
EMS brings pt in from workplace for c/o left sided HA x 3 days; denies accomp symptoms and st hx of same; tylenol taken without relief at 8pm

## 2023-11-08 NOTE — ED Provider Notes (Signed)
[facility] Provider Note    Event Date/Time   First MD Initiated Contact with Patient 11/08/23 [phone]     (approximate)   History   Chief Complaint: Headache   HPI  Robin Mckinney is a 63 y.o. female with a history of hypertension and GERD who comes ED complaining of left-sided headache for the past 3 days ever since a fall at home.  Patient reports that prior to the fall she had been getting dizzy with standing, and 3 days ago she passed out.  Denies any preceding thunderclap headache, no neck pain.  No vision changes or paresthesias.  She does also report right flank pain, which she is worried is due to the fall.  No dysuria          Physical Exam   Triage Vital Signs: ED Triage Vitals  Encounter Vitals Group     BP 11/08/23 0310 99/60     Systolic BP Percentile --      Diastolic BP Percentile --      Pulse Rate 11/08/23 0310 72     Resp 11/08/23 0310 18     Temp 11/08/23 0310 98.9 F (37.2 C)     Temp Source 11/08/23 0310 Oral     SpO2 11/08/23 0310 96 %     Weight 11/08/23 0309 145 lb (65.8 kg)     Height 11/08/23 0309 5\' 3"  (1.6 m)     Head Circumference --      Peak Flow --      Pain Score 11/08/23 0310 8     Pain Loc --      Pain Education --      Exclude from Growth Chart --     Most recent vital signs: Vitals:   11/08/23 0802 11/08/23 0923  BP:  139/80  Pulse:  74  Resp: 18 16  Temp:    SpO2:  100%    General: Awake, no distress.  CV:  Good peripheral perfusion.  Regular rate rhythm Resp:  Normal effort.  Clear to auscultation Abd:  No distention.  Soft nontender.  No CVA tenderness.  Lower chest wall nontender Other:  Dry oral mucosa.  No signs of head trauma   ED Results / Procedures / Treatments   Labs (all labs ordered are listed, but only abnormal results are displayed) Labs Reviewed  BASIC METABOLIC PANEL - Abnormal; Notable for the following components:      Result Value   Sodium 134 (*)    Chloride  92 (*)    BUN 66 (*)    Creatinine, Ser 5.87 (*)    Calcium 8.4 (*)    GFR, Estimated 8 (*)    Anion gap 20 (*)    All other components within normal limits  CBC WITH DIFFERENTIAL/PLATELET - Abnormal; Notable for the following components:   RDW 15.6 (*)    All other components within normal limits  URINALYSIS, ROUTINE W REFLEX MICROSCOPIC - Abnormal; Notable for the following components:   Color, Urine YELLOW (*)    APPearance CLOUDY (*)    Hgb urine dipstick MODERATE (*)    Protein, ur 100 (*)    Leukocytes,Ua MODERATE (*)    Bacteria, UA RARE (*)    All other components within normal limits  URINE CULTURE     EKG    RADIOLOGY CT head interpreted by me, negative for intracranial hemorrhage.  Radiology report reviewed  Chest x-ray unremarkable  CT abdomen pelvis unremarkable  PROCEDURES:  Procedures   MEDICATIONS ORDERED IN ED: Medications  cefTRIAXone (ROCEPHIN) 1 g in sodium chloride 0.9 % 100 mL IVPB (has no administration in time range)  sodium chloride 0.9 % bolus 1,000 mL (1,000 mLs Intravenous New Bag/Given 11/08/23 0814)  metoCLOPramide (REGLAN) injection 10 mg (10 mg Intravenous Given 11/08/23 0814)  methylPREDNISolone sodium succinate (SOLU-MEDROL) 125 mg/2 mL injection 60 mg (60 mg Intravenous Given 11/08/23 0814)     IMPRESSION / MDM / ASSESSMENT AND PLAN / ED COURSE  I reviewed the triage vital signs and the nursing notes.  DDx: Dehydration, AKI, electrolyte derangement, intracranial hemorrhage, ureterolithiasis, pneumothorax, UTI  Patient's presentation is most consistent with acute presentation with potential threat to life or bodily function.  Patient presents with orthostatic symptoms causing syncope with resulting pain related to blunt trauma from fall at home.  Vital signs are normal, she is nontoxic in the ED.  Labs reveal acute renal insufficiency with a creatinine approaching 6 compared to a baseline creatinine of 1.  Urinalysis shows some  signs of UTI, will start Rocephin.  Urine culture ordered.  Chest x-ray and CT imaging unremarkable.  Case discussed with the hospitalist.       FINAL CLINICAL IMPRESSION(S) / ED DIAGNOSES   Final diagnoses:  AKI (acute kidney injury) (HCC)     Rx / DC Orders   ED Discharge Orders     None        Note:  This document was prepared using Dragon voice recognition software and may include unintentional dictation errors.   Sharman Cheek, MD 11/08/23 1022

## 2023-11-08 NOTE — H&P (Addendum)
History and Physical    Patient: Robin Mckinney EAV:[id number] DOB: 12/02/1960 DOA: 11/08/2023 DOS: the patient was seen and examined on 11/08/2023 PCP: [facility]  Patient coming from: Home  Chief Complaint:  Chief Complaint  Patient presents with   Headache   HPI: Robin Mckinney is a 63 y.o. female with medical history significant of polysubstance abuse, CAD, hypertension, tobacco abuse presenting with encephalopathy and AKI.  Limited history in the setting of encephalopathy.  Patient mildly lethargic at the bedside.  Per report, patient with decreased p.o. intake worsening pain and malaise over the past few days.  Also with fall recently landing on right side with chronic right-sided pain.  No reports of chest pain, shortness of breath.  Positive decreased p.o. intake.  No reports of dysuria or increased urinary frequency.  No diarrhea.  Positive left-sided headache.  Has been taking Tylenol at home without relief.  Denies any recent NSAID use.  Baseline hypertension.  Reports compliance with lisinopril.  Does admit to alcohol use.  However, denies any illicit drug use. Presented to the ER afebrile, systolic pressures in the 90s-improving with IV fluids.  Satting well on room air.  White count 6.9, hemoglobin 13.7, platelets 291, urinalysis mildly indicative of infection, creatinine 5.9, bicarb 22.  Chest x-ray, CT head as well as CT of the abdomen pelvis grossly stable. Review of Systems: As mentioned in the history of present illness. All other systems reviewed and are negative. Past Medical History:  Diagnosis Date   Acute gastric ulcer with perforation (HCC) 10/24/2022   CAD (coronary artery disease)    GERD (gastroesophageal reflux disease)    H/O cesarean section    Hypertension    Tobacco abuse    Past Surgical History:  Procedure Laterality Date   CESAREAN SECTION     Social History:  reports that she has been smoking cigarettes. She has never  used smokeless tobacco. She reports that she does not currently use alcohol. She reports current drug use. Drug: Codeine.  No Known Allergies  Family History  Problem Relation Age of Onset   Hypertension Mother    Hypertension Father     Prior to Admission medications   Medication Sig Start Date End Date Taking? Authorizing Provider  aspirin EC 81 MG tablet Take 1 tablet (81 mg total) by mouth daily. 10/22/22   Sunnie Nielsen, DO  calcium carbonate (TUMS - DOSED IN MG ELEMENTAL CALCIUM) 500 MG chewable tablet Chew 1-2 tablets (200-400 mg of elemental calcium total) by mouth 3 (three) times daily as needed for indigestion or heartburn. 10/22/22   Sunnie Nielsen, DO  iron polysaccharides (NIFEREX) 150 MG capsule Take 1 capsule (150 mg total) by mouth daily. 03/27/23 06/25/23  Gillis Santa, MD  lisinopril (ZESTRIL) 20 MG tablet Take 1 tablet (20 mg total) by mouth daily. 03/24/23 03/23/24  Gillis Santa, MD  nicotine (NICODERM CQ - DOSED IN MG/24 HOURS) 21 mg/24hr patch Place 1 patch (21 mg total) onto the skin daily. 10/22/22   Sunnie Nielsen, DO  omeprazole (PRILOSEC) 40 MG capsule Take 1 capsule (40 mg total) by mouth in the morning and at bedtime. 03/23/23 05/22/23  Gillis Santa, MD  ondansetron (ZOFRAN-ODT) 4 MG disintegrating tablet Allow 1-2 tablets to dissolve in your mouth every 8 hours as needed for nausea/vomiting 05/05/23   Loleta Rose, MD  polyethylene glycol (MIRALAX / GLYCOLAX) 17 g packet Take 17 g by mouth daily. 10/22/22   Sunnie Nielsen, DO  Vitamin  D, Ergocalciferol, (DRISDOL) 1.25 MG (50000 UNIT) CAPS capsule Take 50,000 Units by mouth every 7 (seven) days. 12/12/22   [provider]    Physical Exam: Vitals:   11/08/23 0800 11/08/23 0802 11/08/23 0923 11/08/23 1030  BP: 95/64  139/80 126/76  Pulse: 65  74 71  Resp: 18 18 16    Temp:      TempSrc:      SpO2: 100%  100% 95%  Weight:      Height:       Physical Exam Constitutional:      Comments:  Underweight  Disshevled appearing    HENT:     Head: Normocephalic and atraumatic.     Nose: Nose normal.     Mouth/Throat:     Mouth: Mucous membranes are moist.  Eyes:     Pupils: Pupils are equal, round, and reactive to light.  Cardiovascular:     Rate and Rhythm: Normal rate and regular rhythm.  Pulmonary:     Effort: Pulmonary effort is normal.  Abdominal:     General: Bowel sounds are normal.  Musculoskeletal:        General: Normal range of motion.  Skin:    General: Skin is dry.  Neurological:     General: No focal deficit present.  Psychiatric:        Mood and Affect: Mood normal.     Data Reviewed:  There are no new results to review at this time.  CT ABDOMEN PELVIS WO CONTRAST CLINICAL DATA:  Abdominal trauma, blunt.  Headache.  EXAM: CT ABDOMEN AND PELVIS WITHOUT CONTRAST  TECHNIQUE: Multidetector CT imaging of the abdomen and pelvis was performed following the standard protocol without IV contrast.  RADIATION DOSE REDUCTION: This exam was performed according to the departmental dose-optimization program which includes automated exposure control, adjustment of the mA and/or kV according to patient size and/or use of iterative reconstruction technique.  COMPARISON:  CT scan abdomen and pelvis from 05/05/2023.  FINDINGS: Lower chest: There are patchy atelectatic changes in the visualized lung bases. No overt consolidation. No pleural effusion. The heart is normal in size. No pericardial effusion.  Hepatobiliary: The liver is normal in size. Non-cirrhotic configuration. No suspicious mass. No intrahepatic or extrahepatic bile duct dilation. No calcified gallstones. Normal gallbladder wall thickness. No pericholecystic inflammatory changes.  Pancreas: Unremarkable. No pancreatic ductal dilatation or surrounding inflammatory changes.  Spleen: Within normal limits. No focal lesion.  Adrenals/Urinary Tract: Adrenal glands are unremarkable.  No suspicious renal mass. No hydronephrosis. No renal or ureteric calculi. Unremarkable urinary bladder.  Stomach/Bowel: No disproportionate dilation of the small or large bowel loops. No evidence of abnormal bowel wall thickening or inflammatory changes. The appendix is unremarkable.  Vascular/Lymphatic: No ascites or pneumoperitoneum. No abdominal or pelvic lymphadenopathy, by size criteria. No aneurysmal dilation of the major abdominal arteries. There are mild peripheral atherosclerotic vascular calcifications of the aorta and its major branches.  Reproductive: The uterus is unremarkable. No large adnexal mass.  Other: There are small fat containing umbilical and left inguinal hernias. The soft tissues and abdominal wall are otherwise unremarkable.  Musculoskeletal: No suspicious osseous lesions. There are mild multilevel degenerative changes in the visualized spine.  IMPRESSION: *No traumatic injury to the abdomen or pelvis. *Multiple other nonacute observations, as described above.  Aortic Atherosclerosis (ICD10-I70.0).  Electronically Signed   By: Jules Schick M.D.   On: 11/08/2023 09:12 CT HEAD WO CONTRAST ( ) CLINICAL DATA:  Sudden and severe headache  EXAM: CT  HEAD WITHOUT CONTRAST  TECHNIQUE: Contiguous axial images were obtained from the base of the skull through the vertex without intravenous contrast.  RADIATION DOSE REDUCTION: This exam was performed according to the departmental dose-optimization program which includes automated exposure control, adjustment of the mA and/or kV according to patient size and/or use of iterative reconstruction technique.  COMPARISON:  03/22/2023  FINDINGS: Brain: No evidence of acute infarction, hemorrhage, hydrocephalus, extra-axial collection or mass lesion/mass effect.  Vascular: No hyperdense vessel or unexpected calcification.  Skull: Normal. Negative for fracture or focal lesion.  Sinuses/Orbits: No acute  finding.  IMPRESSION: Negative head CT.  Electronically Signed   By: Tiburcio Pea M.D.   On: 11/08/2023 09:06 DG Chest 1 View CLINICAL DATA:  63 year old female with history of right-sided chest pain after a fall.  EXAM: CHEST  1 VIEW  COMPARISON:  Chest x-ray 10/24/2022.  FINDINGS: Lung volumes are normal. No consolidative airspace disease. No pleural effusions. No pneumothorax. No pulmonary nodule or mass noted. Pulmonary vasculature and the cardiomediastinal silhouette are within normal limits.  IMPRESSION: No radiographic evidence of acute cardiopulmonary disease.  Electronically Signed   By: Trudie Reed M.D.   On: 11/08/2023 08:12  Lab Results  Component Value Date   WBC 6.9 11/08/2023   HGB 13.7 11/08/2023   HCT 42.2 11/08/2023   MCV 85.4 11/08/2023   PLT 291 11/08/2023   Last metabolic panel Lab Results  Component Value Date   GLUCOSE 94 11/08/2023   NA 134 (L) 11/08/2023   K 3.9 11/08/2023   CL 92 (L) 11/08/2023   CO2 22 11/08/2023   BUN 66 (H) 11/08/2023   CREATININE 5.87 (H) 11/08/2023   GFRNONAA 8 (L) 11/08/2023   CALCIUM 8.4 (L) 11/08/2023   PHOS 2.2 (L) 03/23/2023   PROT 8.4 (H) 05/05/2023   ALBUMIN 4.6 05/05/2023   BILITOT 0.4 05/05/2023   ALKPHOS 76 05/05/2023   AST 20 05/05/2023   ALT 12 05/05/2023   ANIONGAP 20 (H) 11/08/2023    Assessment and Plan: * AKI (acute kidney injury) (HCC) Cr 5.87 on presentation  BUN-Cr ratio 11.2 concerning for potential intrinsic renal process  CT A&P grossly stable  Renal US pending  Markedly dry on exam  Will place on aggressive IVF hydration  Check FeNa to correlate  Hold nephrotoxic agents including lisinopril  Consult nephrology for further management   Benign essential HTN LLN BP on presentation  Clinically dry  Hold BP regimen for now  Monitor w/ hydration    Fall at home, initial encounter + fall several days ago w/ general R sided pain  Plain films and imaging stable  Non  narcotic pain control  Monitor    UTI (urinary tract infection) UA indicative of infection  IV rocephin  Urine culture  Follow  Polysubstance abuse (HCC) + encephalopathy/lethargy on presentation  Suspect secondary to substance abuse  CT head WNL  UDS pending  Prn narcan in the interim        Advance Care Planning:   Code Status: Full Code   Consults: Nephrology   Family Communication: No family at the bedside   Severity of Illness: The appropriate patient status for this patient is INPATIENT. Inpatient status is judged to be reasonable and necessary in order to provide the required intensity of service to ensure the patient's safety. The patient's presenting symptoms, physical exam findings, and initial radiographic and laboratory data in the context of their chronic comorbidities is felt to place them at high risk  for further clinical deterioration. Furthermore, it is not anticipated that the patient will be medically stable for discharge from the hospital within 2 midnights of admission.   * I certify that at the point of admission it is my clinical judgment that the patient will require inpatient hospital care spanning beyond 2 midnights from the point of admission due to high intensity of service, high risk for further deterioration and high frequency of surveillance required.*  Author: Floydene Flock, MD 11/08/2023 10:49 AM  For on call review [email or web address].

## 2023-11-08 NOTE — ED Notes (Signed)
CCMD called to notify of cardiac monitoring.

## 2023-11-08 NOTE — Assessment & Plan Note (Signed)
Cr 5.87 on presentation  BUN-Cr ratio 11.2 concerning for potential intrinsic renal process  CT A&P grossly stable  Renal US pending  Markedly dry on exam  Will place on aggressive IVF hydration  Check FeNa to correlate  Hold nephrotoxic agents including lisinopril  Consult nephrology for further management

## 2023-11-08 NOTE — ED Notes (Signed)
CCMD notified of patient room change

## 2023-11-08 NOTE — Assessment & Plan Note (Signed)
+   encephalopathy/lethargy on presentation  Suspect secondary to substance abuse  CT head WNL  UDS pending  Prn narcan in the interim

## 2023-11-08 NOTE — Progress Notes (Signed)
Approximately 1830--Pt admitted to room 105B from ED. Upon arrival, pt A&O x 4 and VS obtained. Full assessment completed my this RN. Telemetry connected and notified. All fall precautions in place.

## 2023-11-08 NOTE — Assessment & Plan Note (Signed)
UA indicative of infection  IV rocephin  Urine culture  Follow

## 2023-11-09 DIAGNOSIS — N179 Acute kidney failure, unspecified: Secondary | ICD-10-CM | POA: Diagnosis not present

## 2023-11-09 LAB — CBC
HCT: 35.1 % — ABNORMAL LOW (ref 36.0–46.0)
Hemoglobin: 11.4 g/dL — ABNORMAL LOW (ref 12.0–15.0)
MCH: 27.9 pg (ref 26.0–34.0)
MCHC: 32.5 g/dL (ref 30.0–36.0)
MCV: 86 fL (ref 80.0–100.0)
Platelets: 249 10*3/uL (ref 150–400)
RBC: 4.08 MIL/uL (ref 3.87–5.11)
RDW: 15.3 % (ref 11.5–15.5)
WBC: 6.3 10*3/uL (ref 4.0–10.5)
nRBC: 0 % (ref 0.0–0.2)

## 2023-11-09 LAB — COMPREHENSIVE METABOLIC PANEL
ALT: 9 U/L (ref 0–44)
AST: 20 U/L (ref 15–41)
Albumin: 3.1 g/dL — ABNORMAL LOW (ref 3.5–5.0)
Alkaline Phosphatase: 51 U/L (ref 38–126)
Anion gap: 11 (ref 5–15)
BUN: 66 mg/dL — ABNORMAL HIGH (ref 8–23)
CO2: 24 mmol/L (ref 22–32)
Calcium: 8.1 mg/dL — ABNORMAL LOW (ref 8.9–10.3)
Chloride: 99 mmol/L (ref 98–111)
Creatinine, Ser: 2.87 mg/dL — ABNORMAL HIGH (ref 0.44–1.00)
GFR, Estimated: 18 mL/min — ABNORMAL LOW (ref >60)
Glucose, Bld: 124 mg/dL — ABNORMAL HIGH (ref 70–99)
Potassium: 3.6 mmol/L (ref 3.5–5.1)
Sodium: 134 mmol/L — ABNORMAL LOW (ref 135–145)
Total Bilirubin: 0.5 mg/dL (ref 0.0–1.2)
Total Protein: 6 g/dL — ABNORMAL LOW (ref 6.5–8.1)

## 2023-11-09 MED ORDER — SODIUM CHLORIDE 0.9 % IV SOLN: INTRAVENOUS | Status: AC

## 2023-11-09 NOTE — Progress Notes (Signed)
[facility]  PROGRESS NOTE   Subjective:   Patient seen at bedside.  Comfortable.  Vitals are stable.  Objective:  Vital signs: Blood pressure (!) 141/83, pulse 71, temperature 97.8 F (36.6 C), temperature source Oral, resp. rate 18, height 5\' 3"  (1.6 m), weight 65.8 kg, SpO2 100%.  Intake/Output Summary (Last 24 hours) at 11/09/2023 1519 Last data filed at 11/09/2023 0900 Gross per 24 hour  Intake 779.17 ml  Output --  Net 779.17 ml   Filed Weights   11/08/23 0309  Weight: 65.8 kg     Physical Exam: General:  No acute distress  Head:  Normocephalic, atraumatic. Moist oral mucosal membranes  Eyes:  Anicteric  Neck:  Supple  Lungs:   Clear to auscultation, normal effort  Heart:  S1S2 no rubs  Abdomen:   Soft, nontender, bowel sounds present  Extremities:  peripheral edema.  Neurologic:  Awake, alert, following commands  Skin:  No lesions  Access:     Basic Metabolic Panel: Recent Labs  Lab 11/08/23 0802 11/08/23 1821 11/09/23 0428  NA 134* 135 134*  K 3.9 3.7 3.6  CL 92* 95* 99  CO2 22 24 24   GLUCOSE 94 101* 124*  BUN 66* 68* 66*  CREATININE 5.87* 4.13* 2.87*  CALCIUM 8.4* 8.5* 8.1*   GFR: Estimated Creatinine Clearance: 18.3 mL/min (A) (by C-G formula based on SCr of 2.87 mg/dL (H)).  Liver Function Tests: Recent Labs  Lab 11/08/23 1502 11/09/23 0428  AST 23 20  ALT 13 9  ALKPHOS 67 51  BILITOT 1.0 0.5  PROT 7.5 6.0*  ALBUMIN 3.8 3.1*   No results for input(s): "LIPASE", "AMYLASE" in the last 168 hours. No results for input(s): "AMMONIA" in the last 168 hours.  CBC: Recent Labs  Lab 11/08/23 0802 11/09/23 0428  WBC 6.9 6.3  NEUTROABS 4.3  --   HGB 13.7 11.4*  HCT 42.2 35.1*  MCV 85.4 86.0  PLT 291 249     HbA1C: No results found for: "HGBA1C"  Urinalysis: Recent Labs    11/08/23 0832  COLORURINE YELLOW*  LABSPEC 1.020  PHURINE 5.0  GLUCOSEU NEGATIVE  HGBUR MODERATE*  BILIRUBINUR NEGATIVE  KETONESUR NEGATIVE   PROTEINUR 100*  NITRITE NEGATIVE  LEUKOCYTESUR MODERATE*      Imaging: CT ABDOMEN PELVIS WO CONTRAST Result Date: 11/08/2023 CLINICAL DATA:  Abdominal trauma, blunt.  Headache. EXAM: CT ABDOMEN AND PELVIS WITHOUT CONTRAST TECHNIQUE: Multidetector CT imaging of the abdomen and pelvis was performed following the standard protocol without IV contrast. RADIATION DOSE REDUCTION: This exam was performed according to the departmental dose-optimization program which includes automated exposure control, adjustment of the mA and/or kV according to patient size and/or use of iterative reconstruction technique. COMPARISON:  CT scan abdomen and pelvis from 05/05/2023. FINDINGS: Lower chest: There are patchy atelectatic changes in the visualized lung bases. No overt consolidation. No pleural effusion. The heart is normal in size. No pericardial effusion. Hepatobiliary: The liver is normal in size. Non-cirrhotic configuration. No suspicious mass. No intrahepatic or extrahepatic bile duct dilation. No calcified gallstones. Normal gallbladder wall thickness. No pericholecystic inflammatory changes. Pancreas: Unremarkable. No pancreatic ductal dilatation or surrounding inflammatory changes. Spleen: Within normal limits. No focal lesion. Adrenals/Urinary Tract: Adrenal glands are unremarkable. No suspicious renal mass. No hydronephrosis. No renal or ureteric calculi. Unremarkable urinary bladder. Stomach/Bowel: No disproportionate dilation of the small or large bowel loops. No evidence of abnormal bowel wall thickening or inflammatory changes. The appendix is unremarkable. Vascular/Lymphatic: No ascites  or pneumoperitoneum. No abdominal or pelvic lymphadenopathy, by size criteria. No aneurysmal dilation of the major abdominal arteries. There are mild peripheral atherosclerotic vascular calcifications of the aorta and its major branches. Reproductive: The uterus is unremarkable. No large adnexal mass. Other: There are small  fat containing umbilical and left inguinal hernias. The soft tissues and abdominal wall are otherwise unremarkable. Musculoskeletal: No suspicious osseous lesions. There are mild multilevel degenerative changes in the visualized spine. IMPRESSION: *No traumatic injury to the abdomen or pelvis. *Multiple other nonacute observations, as described above. Aortic Atherosclerosis (ICD10-I70.0). Electronically Signed   By: Jules Schick M.D.   On: 11/08/2023 09:12   CT HEAD WO CONTRAST ( ) Result Date: 11/08/2023 CLINICAL DATA:  Sudden and severe headache EXAM: CT HEAD WITHOUT CONTRAST TECHNIQUE: Contiguous axial images were obtained from the base of the skull through the vertex without intravenous contrast. RADIATION DOSE REDUCTION: This exam was performed according to the departmental dose-optimization program which includes automated exposure control, adjustment of the mA and/or kV according to patient size and/or use of iterative reconstruction technique. COMPARISON:  03/22/2023 FINDINGS: Brain: No evidence of acute infarction, hemorrhage, hydrocephalus, extra-axial collection or mass lesion/mass effect. Vascular: No hyperdense vessel or unexpected calcification. Skull: Normal. Negative for fracture or focal lesion. Sinuses/Orbits: No acute finding. IMPRESSION: Negative head CT. Electronically Signed   By: Tiburcio Pea M.D.   On: 11/08/2023 09:06   DG Chest 1 View Result Date: 11/08/2023 CLINICAL DATA:  63 year old female with history of right-sided chest pain after a fall. EXAM: CHEST  1 VIEW COMPARISON:  Chest x-ray 10/24/2022. FINDINGS: Lung volumes are normal. No consolidative airspace disease. No pleural effusions. No pneumothorax. No pulmonary nodule or mass noted. Pulmonary vasculature and the cardiomediastinal silhouette are within normal limits. IMPRESSION: No radiographic evidence of acute cardiopulmonary disease. Electronically Signed   By: Trudie Reed M.D.   On: 11/08/2023 08:12      Medications:    sodium chloride 125 mL/hr at 11/09/23 1448   cefTRIAXone (ROCEPHIN)  IV 1 g (11/09/23 0805)    heparin  5,000 Units Subcutaneous Q8H    Assessment/ Plan:     Robin Mckinney is a 63 y.o. female with medical problems of hypertension, substance abuse-tobacco, marijuana, cocaine, coronary disease, history of GERD, gastric ulcer was admitted on 11/08/2023 for : Urosepsis and acute kidney injury.  #1: Acute kidney injury: Acute kidney injury most likely secondary to ATN complicated by prerenal azotemia.  Renal indices are improving slowly but steadily.  Continue IV fluids for now.  #2: UTI: Continue Rocephin as ordered.  #3: Hypertension: Continue to hold the antihypertensive medications.  Labs and medications reviewed. Will continue to follow along with you.   LOS: 1 Lorain Childes, MD [facility] 2/1/20253:19 PM

## 2023-11-09 NOTE — Evaluation (Signed)
Occupational Therapy Evaluation Patient Details Name: Robin Mckinney MRN: [id number] DOB: 11/03/1960 Today's Date: 11/09/2023   History of Present Illness Robin Mckinney is a 63 y.o. female with medical history significant of polysubstance abuse, CAD, hypertension, tobacco abuse presenting with encephalopathy and AKI.Positive left-sided headache. Pt with recent fall landing on right side with chronic right-sided pain. Chest x-ray, CT head as well as CT of the abdomen pelvis grossly stable.   Clinical Impression   Prior to hospital admission, pt was independent with ADLs/IADLs and mobility. Pt lives with family that can provide PRN assist. Pt currently requires CGA - supervision for functional transfers and mobility, t/f bathroom using unilateal UE support pushing IV pole, tolieting tasks with supervision. LB bathing after slight urine incontinence CGA, stands at sink to wash hands, mild unsteadiness noted without UE support. Returns to recliner to perform donning/doffing socks underwear with supervision. Pt would benefit from skilled OT services to address noted impairments and functional limitations (see below for any additional details) in order to maximize safety and independence while minimizing falls risk and caregiver burden. Anticipate the need for follow up [facility] OT services upon acute hospital DC.        If plan is discharge home, recommend the following: A little help with walking and/or transfers;A little help with bathing/dressing/bathroom;Assist for transportation    Functional Status Assessment  Patient has had a recent decline in their functional status and demonstrates the ability to make significant improvements in function in a reasonable and predictable amount of time.  Equipment Recommendations  None recommended by OT       Precautions / Restrictions Precautions Precautions: None Restrictions Weight Bearing Restrictions Per Provider Order: No      Mobility Bed  Mobility               General bed mobility comments: NT, recliner start and end of session    Transfers Overall transfer level: Needs assistance Equipment used: None Transfers: Sit to/from Stand Sit to Stand: Supervision           General transfer comment: Supv for safety, patient able to stand without physical assist.      Balance Overall balance assessment: Needs assistance Sitting-balance support: No upper extremity supported, Feet supported Sitting balance-Leahy Scale: Normal     Standing balance support: Single extremity supported, No upper extremity supported, During functional activity Standing balance-Leahy Scale: Good Standing balance comment: mild unsteadiness without UE support; improved stability with single UE support                           ADL either performed or assessed with clinical judgement   ADL Overall ADL's : Needs assistance/impaired     Grooming: Wash/dry hands;Standing;Contact guard assist Grooming Details (indicate cue type and reason): sink level     Lower Body Bathing: Contact guard assist;Sit to/from stand Lower Body Bathing Details (indicate cue type and reason): sit<>stand after incontinent urine episode in bathroom     Lower Body Dressing: Supervision/safety;Sit to/from stand Lower Body Dressing Details (indicate cue type and reason): doffs/dons underwear and socks recliner Toilet Transfer: Contact guard assist;Supervision/safety;Regular Toilet;Grab bars Toilet Transfer Details (indicate cue type and reason): unilateral support on IV pole Toileting- Clothing Manipulation and Hygiene: Supervision/safety;Sit to/from stand       Functional mobility during ADLs: Contact guard assist (unilateral UE support pushing IV pole) General ADL Comments: Pt close to baseline for functional mobility  Pertinent Vitals/Pain Pain Assessment Pain Assessment: 0-10 Pain Score: 4  Pain Location: R Side Pain Descriptors /  Indicators: Aching, Sore Pain Intervention(s): Limited activity within patient's tolerance, Monitored during session     Extremity/Trunk Assessment Upper Extremity Assessment Upper Extremity Assessment: Generalized weakness   Lower Extremity Assessment Lower Extremity Assessment: Generalized weakness   Cervical / Trunk Assessment Cervical / Trunk Assessment: Normal   Communication Communication Communication: No apparent difficulties   Cognition Arousal: Alert Behavior During Therapy: WFL for tasks assessed/performed Overall Cognitive Status: Within Functional Limits for tasks assessed                                       General Comments  BP 136/96, HR 72 SpO2 100% on RA            Home Living Family/patient expects to be discharged to:: Private residence Living Arrangements: Other (Comment) (Friend) Available Help at Discharge: Family;Friend(s);Available PRN/intermittently (Daughter lives close by and can provide assist) Type of Home: House Home Access: Level entry     Home Layout: One level     Bathroom Shower/Tub: Chief Strategy Officer: Standard     Home Equipment: None          Prior Functioning/Environment Prior Level of Function : Independent/Modified Independent             Mobility Comments: IND with mobility ADLs Comments: IND with ADLs/IADLs        OT Problem List: Impaired balance (sitting and/or standing);Decreased strength;Decreased knowledge of use of DME or AE      OT Treatment/Interventions: Self-care/ADL training;Neuromuscular education;Energy conservation;DME and/or AE instruction;Therapeutic activities;Patient/family education;Balance training    OT Goals(Current goals can be found in the care plan section) Acute Rehab OT Goals OT Goal Formulation: With patient Time For Goal Achievement: 11/23/23 Potential to Achieve Goals: Good  OT Frequency: Min 1X/week       AM-PAC OT "6 Clicks" Daily  Activity     Outcome Measure Help from another person eating meals?: None Help from another person taking care of personal grooming?: None Help from another person toileting, which includes using toliet, bedpan, or urinal?: A Little Help from another person bathing (including washing, rinsing, drying)?: A Little Help from another person to put on and taking off regular upper body clothing?: None Help from another person to put on and taking off regular lower body clothing?: A Little 6 Click Score: 21   End of Session Equipment Utilized During Treatment: Gait belt Nurse Communication: Mobility status  Activity Tolerance: Patient tolerated treatment well Patient left: in chair;with call bell/phone within reach;with bed alarm set  OT Visit Diagnosis: Unsteadiness on feet (R26.81);Other abnormalities of gait and mobility (R26.89);Muscle weakness (generalized) (M62.81)                Time: 8413-2440 OT Time Calculation (min): 27 min Charges:  OT General Charges $OT Visit: 1 Visit OT Evaluation $OT Eval Low Complexity: 1 Low OT Treatments $Self Care/Home Management : 8-22 mins  Zanyah Lentsch L. Bushra Denman, OTR/L  11/09/23, 11:08 AM

## 2023-11-09 NOTE — Progress Notes (Signed)
Mobility Specialist - Progress Note   11/09/23 1157  Mobility  Activity Ambulated with assistance in hallway  Level of Assistance Standby assist, set-up cues, supervision of patient - no hands on  Assistive Device Other (Comment) (IV Pole)  Distance Ambulated (ft) 140 ft  Activity Response Tolerated well  Mobility Referral Yes  Mobility visit 1 Mobility  Mobility Specialist Start Time (ACUTE ONLY) 1131  Mobility Specialist Stop Time (ACUTE ONLY) 1156  Mobility Specialist Time Calculation (min) (ACUTE ONLY) 25 min   Pt sitting in recliner on RA upon arrival. Pt STS CGA and ambulates using IV Pole for support SBA. Pt returns to recliner with needs in reach and chair alarm activated.   Robin Mckinney  Mobility Specialist  11/09/23 12:01 PM

## 2023-11-09 NOTE — Plan of Care (Signed)
   Problem: Nutrition: Goal: Adequate nutrition will be maintained Outcome: Progressing   Problem: Coping: Goal: Level of anxiety will decrease Outcome: Progressing   Problem: Safety: Goal: Ability to remain free from injury will improve Outcome: Progressing

## 2023-11-09 NOTE — Progress Notes (Signed)
PROGRESS NOTE    Robin Mckinney  WGN:[id number]  DOB: Feb 02, 1961  DOA: 11/08/2023 [facility]: [facility]:   Hospital course:  63 year old female with CAD, HTN and polysubstance use was admitted 11/08/2023 complaining of headache, fall and lethargy with decreased p.o. intake over the last several days.  Workup revealed acute kidney injury with creatinine of 5.8, urine consistent with UTI and urine screen positive for cocaine.  Of note patient had been continues to take her lisinopril.  Patient was seen by Dr. Thedore Mins of nephrology who felt UA was consistent with likely ATN from decreased p.o. intake and dehydration.  Patient was admitted for IV hydration  Subjective:  Patient states that she feels better.  Denies confusion.  Notes that she is here because she has been falling and weak.  Did not realize there was anything wrong with her kidneys but does remember hearing something about her kidneys yesterday but admits that she was confused yesterday but is no longer confused today.   Objective: Vitals:   11/08/23 1944 11/09/23 0158 11/09/23 0824 11/09/23 1130  BP: 108/61 113/77 (!) 142/105 (!) 141/83  Pulse: 71 66 61 71  Resp: 18 18 16 18   Temp: 98.5 F (36.9 C) 97.9 F (36.6 C) 98.2 F (36.8 C) 97.8 F (36.6 C)  TempSrc:    Oral  SpO2: 98% 99% (!) 74% 100%  Weight:      Height:        Intake/Output Summary (Last 24 hours) at 11/09/2023 1324 Last data filed at 11/09/2023 0900 Gross per 24 hour  Intake 779.17 ml  Output --  Net 779.17 ml   Filed Weights   11/08/23 0309  Weight: 65.8 kg     Exam:  General: Patient appearing older than stated age sitting up in recliner in NAD.  She is logical and coherent. Eyes: sclera anicteric, conjuctiva mild injection bilaterally CVS: S1-S2, regular  Respiratory:  decreased air entry bilaterally secondary to decreased inspiratory effort, rales at bases  GI: NABS, soft, NT  LE: Warm and  well-perfused Neuro: A/O x 3,  grossly nonfocal.  Psych: patient is logical and coherent, judgement and insight appear normal, mood and affect appropriate to situation.  Data Reviewed:  Basic Metabolic Panel: Recent Labs  Lab 11/08/23 0802 11/08/23 1821 11/09/23 0428  NA 134* 135 134*  K 3.9 3.7 3.6  CL 92* 95* 99  CO2 22 24 24   GLUCOSE 94 101* 124*  BUN 66* 68* 66*  CREATININE 5.87* 4.13* 2.87*  CALCIUM 8.4* 8.5* 8.1*    CBC: Recent Labs  Lab 11/08/23 0802 11/09/23 0428  WBC 6.9 6.3  NEUTROABS 4.3  --   HGB 13.7 11.4*  HCT 42.2 35.1*  MCV 85.4 86.0  PLT 291 249     Scheduled Meds:  heparin  5,000 Units Subcutaneous Q8H   Continuous Infusions:  cefTRIAXone (ROCEPHIN)  IV 1 g (11/09/23 0805)     Assessment & Plan:   AKI likely secondary to ATN per Dr. Thedore Mins Improving with with NS at 125 and treatment of infection Continue NS at 125 cc an hour for 24 more hours  UTI Continue ceftriaxone day #2 today Urine culture pending  Acute toxic metabolic encephalopathy Resolved, patient is compos mentis at present  HTN BP meds are on hold given decreased volume status Likely need to restart BP meds tomorrow  Fall at home, initial encounter Imaging negative for acute fracture  Polysubstance use Patient advised to stop using  cocaine and illicit drugs    DVT prophylaxis: Subcu heparin Code Status: Full Family Communication: None today     Studies: CT ABDOMEN PELVIS WO CONTRAST Result Date: 11/08/2023 CLINICAL DATA:  Abdominal trauma, blunt.  Headache. EXAM: CT ABDOMEN AND PELVIS WITHOUT CONTRAST TECHNIQUE: Multidetector CT imaging of the abdomen and pelvis was performed following the standard protocol without IV contrast. RADIATION DOSE REDUCTION: This exam was performed according to the departmental dose-optimization program which includes automated exposure control, adjustment of the mA and/or kV according to patient size and/or use of iterative  reconstruction technique. COMPARISON:  CT scan abdomen and pelvis from 05/05/2023. FINDINGS: Lower chest: There are patchy atelectatic changes in the visualized lung bases. No overt consolidation. No pleural effusion. The heart is normal in size. No pericardial effusion. Hepatobiliary: The liver is normal in size. Non-cirrhotic configuration. No suspicious mass. No intrahepatic or extrahepatic bile duct dilation. No calcified gallstones. Normal gallbladder wall thickness. No pericholecystic inflammatory changes. Pancreas: Unremarkable. No pancreatic ductal dilatation or surrounding inflammatory changes. Spleen: Within normal limits. No focal lesion. Adrenals/Urinary Tract: Adrenal glands are unremarkable. No suspicious renal mass. No hydronephrosis. No renal or ureteric calculi. Unremarkable urinary bladder. Stomach/Bowel: No disproportionate dilation of the small or large bowel loops. No evidence of abnormal bowel wall thickening or inflammatory changes. The appendix is unremarkable. Vascular/Lymphatic: No ascites or pneumoperitoneum. No abdominal or pelvic lymphadenopathy, by size criteria. No aneurysmal dilation of the major abdominal arteries. There are mild peripheral atherosclerotic vascular calcifications of the aorta and its major branches. Reproductive: The uterus is unremarkable. No large adnexal mass. Other: There are small fat containing umbilical and left inguinal hernias. The soft tissues and abdominal wall are otherwise unremarkable. Musculoskeletal: No suspicious osseous lesions. There are mild multilevel degenerative changes in the visualized spine. IMPRESSION: *No traumatic injury to the abdomen or pelvis. *Multiple other nonacute observations, as described above. Aortic Atherosclerosis (ICD10-I70.0). Electronically Signed   By: Jules Schick M.D.   On: 11/08/2023 09:12   CT HEAD WO CONTRAST ( ) Result Date: 11/08/2023 CLINICAL DATA:  Sudden and severe headache EXAM: CT HEAD WITHOUT CONTRAST  TECHNIQUE: Contiguous axial images were obtained from the base of the skull through the vertex without intravenous contrast. RADIATION DOSE REDUCTION: This exam was performed according to the departmental dose-optimization program which includes automated exposure control, adjustment of the mA and/or kV according to patient size and/or use of iterative reconstruction technique. COMPARISON:  03/22/2023 FINDINGS: Brain: No evidence of acute infarction, hemorrhage, hydrocephalus, extra-axial collection or mass lesion/mass effect. Vascular: No hyperdense vessel or unexpected calcification. Skull: Normal. Negative for fracture or focal lesion. Sinuses/Orbits: No acute finding. IMPRESSION: Negative head CT. Electronically Signed   By: Tiburcio Pea M.D.   On: 11/08/2023 09:06   DG Chest 1 View Result Date: 11/08/2023 CLINICAL DATA:  63 year old female with history of right-sided chest pain after a fall. EXAM: CHEST  1 VIEW COMPARISON:  Chest x-ray 10/24/2022. FINDINGS: Lung volumes are normal. No consolidative airspace disease. No pleural effusions. No pneumothorax. No pulmonary nodule or mass noted. Pulmonary vasculature and the cardiomediastinal silhouette are within normal limits. IMPRESSION: No radiographic evidence of acute cardiopulmonary disease. Electronically Signed   By: Trudie Reed M.D.   On: 11/08/2023 08:12    Principal Problem:   AKI (acute kidney injury) [facility]) Active Problems:   Benign essential HTN   Fall at home, initial encounter   Polysubstance abuse (HCC)   UTI (urinary tract infection)     Horatio Pel Orma [facility]aming, [facility]  If 7PM-7AM, please contact night-coverage [email or web address]   LOS: 1 day

## 2023-11-09 NOTE — Progress Notes (Signed)
Physical Therapy Evaluation Patient Details Name: Robin Mckinney MRN: [id number] DOB: [birth year] Today's Date: 11/09/2023  History of Present Illness  Robin Mckinney is a 63 y.o. female with medical history significant of polysubstance abuse, CAD, hypertension, tobacco abuse presenting with encephalopathy and AKI.Positive left-sided headache. Pt with recent fall landing on right side with chronic right-sided pain. Chest x-ray, CT head as well as CT of the abdomen pelvis grossly stable.   Clinical Impression  Patient supine in bed upon arrival, agreeable to PT evaluation. Prior to admission, patient reports was IND with mobility, ADLs/IADLs. Lives in single level home with level entry, lives with close friend. Has family that lives close by that can provide assist PRN.   On evaluation, patient was MOD I with bed mobility, increased time required due to pain in R side. Reports pain present since recent fall. Patient require supervision with STS transfer, and Supv/CGA for ambulation. Initially ambulating with IV pole for single UE support x 30 ft, then progressing to no AD for rest of ambulation distance, mild unsteadiness without UE support. Encouraged use/trial of SPC, patient in agreement. BP slightly elevated during session, but remain stable with activity. Patient left in recliner, with all needs in reach and meal tray. Patient will benefit from skilled acute PT services to address functional impairments (see below for additional) and maximize functional mobility. Anticipate the need for follow up PT services upon acute hospital discharge. Will continue to follow acutely.        If plan is discharge home, recommend the following: Assist for transportation;A little help with walking and/or transfers   Can travel by private vehicle        Equipment Recommendations None recommended by PT  Recommendations for Other Services       Functional Status Assessment Patient has had a recent  decline in their functional status and demonstrates the ability to make significant improvements in function in a reasonable and predictable amount of time.     Precautions / Restrictions Precautions Precautions: None Restrictions Weight Bearing Restrictions Per Provider Order: No      Mobility  Bed Mobility Overal bed mobility: Modified Independent             General bed mobility comments: Patient able to get from supine > EOB MOD I, increased time due to pain in R side. but no physical assist requried. BP seated EOB: 154/99    Transfers Overall transfer level: Needs assistance Equipment used: None Transfers: Sit to/from Stand Sit to Stand: Supervision           General transfer comment: Supv for safety, patient able to stand without physical assist.    Ambulation/Gait Ambulation/Gait assistance: Supervision Gait Distance (Feet): 75 Feet Assistive device: IV Pole, None         General Gait Details: Pt able to ambulate initially with IV pole for support x 30 ft; no evidence of imbalance with support. Progressed to no UE support, patient require close supv/CGA due to mild unsteadiness. Educated on patient use of SPC to promote improved stability at this time, patient in agreement. BP after ambulation: 157/98, HR: 120  Stairs            Wheelchair Mobility     Tilt Bed    Modified Rankin (Stroke Patients Only)       Balance Overall balance assessment: Needs assistance Sitting-balance support: No upper extremity supported, Feet supported Sitting balance-Leahy Scale: Normal     Standing balance support: Single extremity  supported, No upper extremity supported, During functional activity Standing balance-Leahy Scale: Good Standing balance comment: mild unsteadiness without UE support; improved stability with single UE support                             Pertinent Vitals/Pain Pain Assessment Pain Assessment: 0-10 Pain Score: 4  Pain  Location: R Side Pain Descriptors / Indicators: Aching, Sore Pain Intervention(s): Limited activity within patient's tolerance, Monitored during session    Home Living Family/patient expects to be discharged to:: Private residence Living Arrangements: Other (Comment) (Friend) Available Help at Discharge: Family;Friend(s);Available PRN/intermittently (Daughter lives close by and can provide assist) Type of Home: House Home Access: Level entry       Home Layout: One level Home Equipment: None      Prior Function Prior Level of Function : Independent/Modified Independent             Mobility Comments: IND with mobility ADLs Comments: IND with ADLs/IADLs     Extremity/Trunk Assessment   Upper Extremity Assessment Upper Extremity Assessment: Defer to OT evaluation    Lower Extremity Assessment Lower Extremity Assessment: Generalized weakness    Cervical / Trunk Assessment Cervical / Trunk Assessment: Normal  Communication   Communication Communication: No apparent difficulties  Cognition Arousal: Alert Behavior During Therapy: WFL for tasks assessed/performed Overall Cognitive Status: Within Functional Limits for tasks assessed                                          General Comments      Exercises     Assessment/Plan    PT Assessment Patient needs continued PT services  PT Problem List Decreased strength;Decreased activity tolerance;Decreased balance;Decreased mobility;Pain       PT Treatment Interventions DME instruction;Gait training;Stair training;Functional mobility training;Therapeutic activities;Therapeutic exercise;Balance training;Neuromuscular re-education    PT Goals (Current goals can be found in the Care Plan section)  Acute Rehab PT Goals Patient Stated Goal: Get Home PT Goal Formulation: With patient Time For Goal Achievement: 11/23/23 Potential to Achieve Goals: Good    Frequency Min 1X/week     Co-evaluation                AM-PAC PT "6 Clicks" Mobility  Outcome Measure Help needed turning from your back to your side while in a flat bed without using bedrails?: None Help needed moving from lying on your back to sitting on the side of a flat bed without using bedrails?: None Help needed moving to and from a bed to a chair (including a wheelchair)?: A Little Help needed standing up from a chair using your arms (e.g., wheelchair or bedside chair)?: A Little Help needed to walk in hospital room?: A Little Help needed climbing 3-5 steps with a railing? : A Little 6 Click Score: 20    End of Session Equipment Utilized During Treatment: Gait belt Activity Tolerance: Patient tolerated treatment well Patient left: in chair;with call bell/phone within reach Nurse Communication: Mobility status PT Visit Diagnosis: Unsteadiness on feet (R26.81);History of falling (Z91.81);Muscle weakness (generalized) (M62.81)    Time: 1610-9604 PT Time Calculation (min) (ACUTE ONLY): 25 min   Charges:   PT Evaluation $PT Eval Low Complexity: 1 Low   PT General Charges $$ ACUTE PT VISIT: 1 Visit         Creed Copper Fairly, PT,  DPT 11/09/23 9:15 AM

## 2023-11-10 DIAGNOSIS — N179 Acute kidney failure, unspecified: Secondary | ICD-10-CM | POA: Diagnosis not present

## 2023-11-10 LAB — RENAL FUNCTION PANEL
Albumin: 2.8 g/dL — ABNORMAL LOW (ref 3.5–5.0)
Anion gap: 7 (ref 5–15)
BUN: 31 mg/dL — ABNORMAL HIGH (ref 8–23)
CO2: 24 mmol/L (ref 22–32)
Calcium: 8 mg/dL — ABNORMAL LOW (ref 8.9–10.3)
Chloride: 107 mmol/L (ref 98–111)
Creatinine, Ser: 1 mg/dL (ref 0.44–1.00)
GFR, Estimated: >60 mL/min (ref >60)
Glucose, Bld: 92 mg/dL (ref 70–99)
Phosphorus: 2.3 mg/dL — ABNORMAL LOW (ref 2.5–4.6)
Potassium: 3.9 mmol/L (ref 3.5–5.1)
Sodium: 138 mmol/L (ref 135–145)

## 2023-11-10 LAB — URINE CULTURE

## 2023-11-10 MED ORDER — LISINOPRIL 20 MG PO TABS
20.0000 mg | ORAL_TABLET | ORAL | Status: AC
  Administered 2023-11-10: 20 mg via ORAL
  Filled 2023-11-10: qty 1

## 2023-11-10 MED ORDER — BISACODYL 10 MG RE SUPP
10.0000 mg | Freq: Once | RECTAL | Status: AC
  Administered 2023-11-10: 10 mg via RECTAL
  Filled 2023-11-10: qty 1

## 2023-11-10 NOTE — Plan of Care (Signed)

## 2023-11-10 NOTE — Progress Notes (Signed)
  [facility]  PROGRESS NOTE   Subjective:   Patient seen at bedside.  Feels much better.  He ate well.  Urine output is good.  Objective:  Vital signs: Blood pressure (!) 148/108, pulse (!) 58, temperature 98 F (36.7 C), resp. rate 18, height 5\' 3"  (1.6 m), weight 65.8 kg, SpO2 100%.  Intake/Output Summary (Last 24 hours) at 11/10/2023 1203 Last data filed at 11/10/2023 0005 Gross per 24 hour  Intake 1598.36 ml  Output --  Net 1598.36 ml   Filed Weights   11/08/23 0309  Weight: 65.8 kg     Physical Exam: General:  No acute distress  Head:  Normocephalic, atraumatic. Moist oral mucosal membranes  Eyes:  Anicteric  Neck:  Supple  Lungs:   Clear to auscultation, normal effort  Heart:  S1S2 no rubs  Abdomen:   Soft, nontender, bowel sounds present  Extremities:  peripheral edema.  Neurologic:  Awake, alert, following commands  Skin:  No lesions  Access:     Basic Metabolic Panel: Recent Labs  Lab 11/08/23 0802 11/08/23 1821 11/09/23 0428 11/10/23 0512  NA 134* 135 134* 138  K 3.9 3.7 3.6 3.9  CL 92* 95* 99 107  CO2 22 24 24 24   GLUCOSE 94 101* 124* 92  BUN 66* 68* 66* 31*  CREATININE 5.87* 4.13* 2.87* 1.00  CALCIUM 8.4* 8.5* 8.1* 8.0*  PHOS  --   --   --  2.3*   GFR: Estimated Creatinine Clearance: 52.5 mL/min (by C-G formula based on SCr of 1 mg/dL).  Liver Function Tests: Recent Labs  Lab 11/08/23 1502 11/09/23 0428 11/10/23 0512  AST 23 20  --   ALT 13 9  --   ALKPHOS 67 51  --   BILITOT 1.0 0.5  --   PROT 7.5 6.0*  --   ALBUMIN 3.8 3.1* 2.8*   No results for input(s): "LIPASE", "AMYLASE" in the last 168 hours. No results for input(s): "AMMONIA" in the last 168 hours.  CBC: Recent Labs  Lab 11/08/23 0802 11/09/23 0428  WBC 6.9 6.3  NEUTROABS 4.3  --   HGB 13.7 11.4*  HCT 42.2 35.1*  MCV 85.4 86.0  PLT 291 249     HbA1C: No results found for: "HGBA1C"  Urinalysis: Recent Labs    11/08/23 0832  COLORURINE YELLOW*   LABSPEC 1.020  PHURINE 5.0  GLUCOSEU NEGATIVE  HGBUR MODERATE*  BILIRUBINUR NEGATIVE  KETONESUR NEGATIVE  PROTEINUR 100*  NITRITE NEGATIVE  LEUKOCYTESUR MODERATE*      Imaging: No results found.   Medications:    sodium chloride 125 mL/hr at 11/10/23 0737    bisacodyl  10 mg Rectal Once   heparin  5,000 Units Subcutaneous Q8H    Assessment/ Plan:     MAHEK SCHLESINGER is a 63 y.o. female with medical problems of hypertension, substance abuse-tobacco, marijuana, cocaine, coronary disease, history of GERD, gastric ulcer was admitted on 11/08/2023 for : Urosepsis and acute kidney injury.   #1: Acute kidney injury: Acute kidney injury most likely secondary to ATN complicated by prerenal azotemia.  Renal indices are improving towards baseline.  We can discontinue the IV fluids at this time.     #2: UTI: Continue Rocephin as ordered.   #3: Hypertension: Can resume her outpatient antihypertensive medications.  Labs and medications reviewed. Will continue to follow along with you.   LOS: 2 Lorain Childes, MD [facility] 2/2/202512:03 PM

## 2023-11-10 NOTE — Progress Notes (Signed)
Patient is alert and oriented X 4. Discharge instruction given. Peripheral I/v removed. No any questions at this time.

## 2023-11-10 NOTE — Plan of Care (Signed)

## 2023-11-10 NOTE — TOC Transition Note (Signed)
Transition of Care [facility]) - Discharge Note   Patient Details  Name: Robin Mckinney MRN: [id number] Date of Birth: 1961-06-13  [facility]) CM/SW Contact:  Bing Quarry, RN Phone Number: 11/10/2023, 1:07 PM   Clinical Narrative:  2/2: Patient discharge to home/self care. [employer] to deliver a cane tomorrow to home address and will contact patient as she had to leave when taxi arranged arrived. Voucher and waiver provided.   Gabriel Cirri MSN RN CM  RN Case Manager [place]  Transitions of Care Direct Dial: [phone] (Weekends Only) [facility] Main Office Phone: [phone] [facility] Fax: [phone] [email or web address]      Final next level of care: Home/Self Care (No HH or OP therapy needs per discharging provider.) Barriers to Discharge: No Barriers Identified, Barriers Resolved   Patient Goals and CMS Choice            Discharge Placement                       Discharge Plan and Services Additional resources added to the After Visit Summary for                  DME Arranged: N/A DME Agency: NA       HH Arranged: NA HH Agency: NA        Social Drivers of Health (SDOH) Interventions SDOH Screenings   Food Insecurity: No Food Insecurity (11/08/2023)  Housing: Low Risk  (11/08/2023)  Transportation Needs: No Transportation Needs (11/08/2023)  Utilities: Not At Risk (11/08/2023)  Tobacco Use: High Risk (11/08/2023)     Readmission Risk Interventions     No data to display

## 2023-11-10 NOTE — Discharge Summary (Signed)
MURIAH HARSHA Mckinney:[id number] DOB: [birth year] DOA: 11/08/2023  [facility]  Admit date: 11/08/2023  Discharge date: 11/10/2023  Admitted From: Home   disposition: Home  Recommendations for Outpatient Follow-up:   Follow up with PCP in 1-2 weeks   Home Health: [facility] PT Equipment/Devices: Cane Consultations: None Discharge Condition: Improved CODE STATUS: Full Diet Recommendation: Heart Healthy   Diet Order             Diet - low sodium heart healthy           Diet Heart Room service appropriate? Yes; Fluid consistency: Thin  Diet effective now                    Chief Complaint  Patient presents with   Headache     Brief history of present illness from the day of admission and additional interim summary     63 y.o. female with medical history significant of polysubstance abuse, CAD, hypertension, tobacco abuse presenting with encephalopathy and AKI.  Limited history in the setting of encephalopathy.  Patient mildly lethargic at the bedside.  Per report, patient with decreased p.o. intake worsening pain and malaise over the past few days.  Also with fall recently landing on right side with chronic right-sided pain.  No reports of chest pain, shortness of breath.  Positive decreased p.o. intake.  No reports of dysuria or increased urinary frequency.  No diarrhea.  Positive left-sided headache.  Has been taking Tylenol at home without relief.  Denies any recent NSAID use.  Baseline hypertension.  Reports compliance with lisinopril.  Does admit to alcohol use.  However, denies any illicit drug use.   Workup in the emergency room showed severely elevated creatinine of 5.87.  Baseline creatinine of 1.01 from July 2024.  Hemoglobin is normal.  Platelet count is normal.  Urinalysis  with moderate blood, moderate leukocytes, 21-50 RBCs, 11-20 WBCs.  Urine drug screen is positive for cocaine.  Urine culture is pending.  CT scan of the abdomen and pelvis without contrast is negative for any traumatic injury to the abdomen or pelvis.  Kidney imaging is unremarkable.  No stones noted. Respiratory panel including influenza A/B, RSV, SARS-CoV-2 is negative.                                                                   Hospital Course   Patient was treated with IV fluid resuscitation and antibiotics for her UTI.  The morning after admission patient's encephalopathy had resolved although she was still very tired.  Patient's creatinine decreased from 5-2.8.  Fluid resuscitation was continued and on the following day, creatinine was 1.0.  Patient's mental status was normal.  She was requesting to go home.  She had completed 3 days  of ceftriaxone for her urinary tract infection.  Patient was discharged home to follow-up with her PCP and to request outpatient PT referral if she still wanted it.  AKI likely secondary to ATN per Dr. Thedore Mins Normalized with NS and treatment of infection   UTI Completed 3 days of ceftriaxone   Acute toxic metabolic encephalopathy Resolved, patient is compos mentis at present   HTN Continue lisinopril per home doses   Fall at home, initial encounter Imaging negative for acute fracture   Polysubstance use Patient advised to stop using cocaine and illicit drugs   Discharge diagnosis     Principal Problem:   AKI (acute kidney injury) [facility]) Active Problems:   Benign essential HTN   Fall at home, initial encounter   Polysubstance abuse (HCC)   UTI (urinary tract infection)    Discharge instructions    Discharge Instructions     Diet - low sodium heart healthy   Complete by: As directed    Discharge instructions   Complete by: As directed    Please make sure to take care of yourself, you need to eat and drink properly.  Do not use any  medications that will alter your level of functioning or will cause you to stop eating and drinking properly.   Increase activity slowly   Complete by: As directed        Discharge Medications   Allergies as of 11/10/2023   No Known Allergies      Medication List     STOP taking these medications    ondansetron 4 MG disintegrating tablet Commonly known as: ZOFRAN-ODT   Vitamin D (Ergocalciferol) 1.25 MG (50000 UNIT) Caps capsule Commonly known as: DRISDOL       TAKE these medications    aspirin EC 81 MG tablet Take 1 tablet (81 mg total) by mouth daily.   calcium carbonate 500 MG chewable tablet Commonly known as: TUMS - dosed in mg elemental calcium Chew 1-2 tablets (200-400 mg of elemental calcium total) by mouth 3 (three) times daily as needed for indigestion or heartburn.   iron polysaccharides 150 MG capsule Commonly known as: NIFEREX Take 1 capsule (150 mg total) by mouth daily.   lisinopril 20 MG tablet Commonly known as: ZESTRIL Take 1 tablet (20 mg total) by mouth daily.   nicotine 21 mg/24hr patch Commonly known as: NICODERM CQ - dosed in mg/24 hours Place 1 patch (21 mg total) onto the skin daily.   omeprazole 40 MG capsule Commonly known as: PRILOSEC Take 1 capsule (40 mg total) by mouth in the morning and at bedtime.   polyethylene glycol 17 g packet Commonly known as: MIRALAX / GLYCOLAX Take 17 g by mouth daily.          Major procedures and Radiology Reports - PLEASE review detailed and final reports thoroughly  -        CT ABDOMEN PELVIS WO CONTRAST Result Date: 11/08/2023 CLINICAL DATA:  Abdominal trauma, blunt.  Headache. EXAM: CT ABDOMEN AND PELVIS WITHOUT CONTRAST TECHNIQUE: Multidetector CT imaging of the abdomen and pelvis was performed following the standard protocol without IV contrast. RADIATION DOSE REDUCTION: This exam was performed according to the departmental dose-optimization program which includes automated exposure  control, adjustment of the mA and/or kV according to patient size and/or use of iterative reconstruction technique. COMPARISON:  CT scan abdomen and pelvis from 05/05/2023. FINDINGS: Lower chest: There are patchy atelectatic changes in the visualized lung bases. No overt consolidation. No pleural effusion.  The heart is normal in size. No pericardial effusion. Hepatobiliary: The liver is normal in size. Non-cirrhotic configuration. No suspicious mass. No intrahepatic or extrahepatic bile duct dilation. No calcified gallstones. Normal gallbladder wall thickness. No pericholecystic inflammatory changes. Pancreas: Unremarkable. No pancreatic ductal dilatation or surrounding inflammatory changes. Spleen: Within normal limits. No focal lesion. Adrenals/Urinary Tract: Adrenal glands are unremarkable. No suspicious renal mass. No hydronephrosis. No renal or ureteric calculi. Unremarkable urinary bladder. Stomach/Bowel: No disproportionate dilation of the small or large bowel loops. No evidence of abnormal bowel wall thickening or inflammatory changes. The appendix is unremarkable. Vascular/Lymphatic: No ascites or pneumoperitoneum. No abdominal or pelvic lymphadenopathy, by size criteria. No aneurysmal dilation of the major abdominal arteries. There are mild peripheral atherosclerotic vascular calcifications of the aorta and its major branches. Reproductive: The uterus is unremarkable. No large adnexal mass. Other: There are small fat containing umbilical and left inguinal hernias. The soft tissues and abdominal wall are otherwise unremarkable. Musculoskeletal: No suspicious osseous lesions. There are mild multilevel degenerative changes in the visualized spine. IMPRESSION: *No traumatic injury to the abdomen or pelvis. *Multiple other nonacute observations, as described above. Aortic Atherosclerosis (ICD10-I70.0). Electronically Signed   By: Jules Schick M.D.   On: 11/08/2023 09:12   CT HEAD WO CONTRAST ( ) Result  Date: 11/08/2023 CLINICAL DATA:  Sudden and severe headache EXAM: CT HEAD WITHOUT CONTRAST TECHNIQUE: Contiguous axial images were obtained from the base of the skull through the vertex without intravenous contrast. RADIATION DOSE REDUCTION: This exam was performed according to the departmental dose-optimization program which includes automated exposure control, adjustment of the mA and/or kV according to patient size and/or use of iterative reconstruction technique. COMPARISON:  03/22/2023 FINDINGS: Brain: No evidence of acute infarction, hemorrhage, hydrocephalus, extra-axial collection or mass lesion/mass effect. Vascular: No hyperdense vessel or unexpected calcification. Skull: Normal. Negative for fracture or focal lesion. Sinuses/Orbits: No acute finding. IMPRESSION: Negative head CT. Electronically Signed   By: Tiburcio Pea M.D.   On: 11/08/2023 09:06   DG Chest 1 View Result Date: 11/08/2023 CLINICAL DATA:  63 year old female with history of right-sided chest pain after a fall. EXAM: CHEST  1 VIEW COMPARISON:  Chest x-ray 10/24/2022. FINDINGS: Lung volumes are normal. No consolidative airspace disease. No pleural effusions. No pneumothorax. No pulmonary nodule or mass noted. Pulmonary vasculature and the cardiomediastinal silhouette are within normal limits. IMPRESSION: No radiographic evidence of acute cardiopulmonary disease. Electronically Signed   By: Trudie Reed M.D.   On: 11/08/2023 08:12    Micro Results    Recent Results (from the past 240 hours)  Urine Culture     Status: None (Preliminary result)   Collection Time: 11/08/23  8:02 AM   Specimen: Urine, Clean Catch  Result Value Ref Range Status   Specimen Description   Final    URINE, CLEAN CATCH Performed at [facility], [address] [place], [place] [address]    Special Requests   Final    NONE Performed at [facility], [address], [place], [place] [address]    Culture   Final    CULTURE  REINCUBATED FOR BETTER GROWTH Performed at [facility], [address], [place], [place] [address]    Report Status PENDING  Incomplete    Today   Subjective    Robin Mckinney feels much improved since admission.  Feels ready to go home.  Denies chest pain, shortness of breath or abdominal pain.  Feels they can take care of  themselves with the resources they have at home.  Objective   Blood pressure (!) 148/108, pulse (!) 58, temperature 98 F (36.7 C), resp. rate 18, height 5\' 3"  (1.6 m), weight 65.8 kg, SpO2 100%.   Intake/Output Summary (Last 24 hours) at 11/10/2023 1129 Last data filed at 11/10/2023 0005 Gross per 24 hour  Intake 1598.36 ml  Output --  Net 1598.36 ml    Exam General: Patient appears well and in good spirits sitting up in bed in no acute distress.  Eyes: sclera anicteric, conjuctiva mild injection bilaterally CVS: S1-S2, regular  Respiratory:  decreased air entry bilaterally secondary to decreased inspiratory effort, rales at bases  GI: NABS, soft, NT  LE: No edema.  Neuro: A/O x 3, Moving all extremities equally with normal strength, CN 3-12 intact, grossly nonfocal.  Psych: patient is logical and coherent, judgement and insight appear normal, mood and affect appropriate to situation.    Data Review   CBC w Diff:  Lab Results  Component Value Date   WBC 6.3 11/09/2023   HGB 11.4 (L) 11/09/2023   HGB 14.7 07/07/2013   HCT 35.1 (L) 11/09/2023   HCT 43.0 07/07/2013   PLT 249 11/09/2023   PLT 267 07/07/2013   LYMPHOPCT 27 11/08/2023   MONOPCT 10 11/08/2023   EOSPCT 0 11/08/2023   BASOPCT 0 11/08/2023    CMP:  Lab Results  Component Value Date   NA 138 11/10/2023   NA 138 07/07/2013   K 3.9 11/10/2023   K 3.6 07/07/2013   CL 107 11/10/2023   CL 107 07/07/2013   CO2 24 11/10/2023   CO2 25 07/07/2013   BUN 31 (H) 11/10/2023   BUN 8 07/07/2013   CREATININE 1.00 11/10/2023   CREATININE 0.66 07/07/2013   PROT 6.0 (L) 11/09/2023    PROT 7.0 07/07/2013   ALBUMIN 2.8 (L) 11/10/2023   ALBUMIN 3.5 07/07/2013   BILITOT 0.5 11/09/2023   BILITOT 0.3 07/07/2013   ALKPHOS 51 11/09/2023   ALKPHOS 89 07/07/2013   AST 20 11/09/2023   AST 24 07/07/2013   ALT 9 11/09/2023   ALT 22 07/07/2013  .   Total Time in preparing paper work, data evaluation and todays exam - 35 minutes  Pieter Partridge M.D on 11/10/2023 at 11:29 AM  [facility]

## 2024-01-12 IMAGING — US US EXTREM LOW VENOUS*L*
1 series · 14 of 24 positions shown · non-contrast
Comparison: None.

CLINICAL DATA: Left lower leg pain and swelling today. Clinical
concern for DVT

EXAM:
LEFT LOWER EXTREMITY VENOUS DOPPLER ULTRASOUND
TECHNIQUE: Gray-scale sonography with compression, as well as color and duplex
ultrasound, were performed to evaluate the deep venous system(s)
from the level of the common femoral vein through the popliteal and
proximal calf veins.

[Series 1: us venous img lower uni left (dvt) · portal-venous · 14 of 44 slices shown]
[im 1/44]
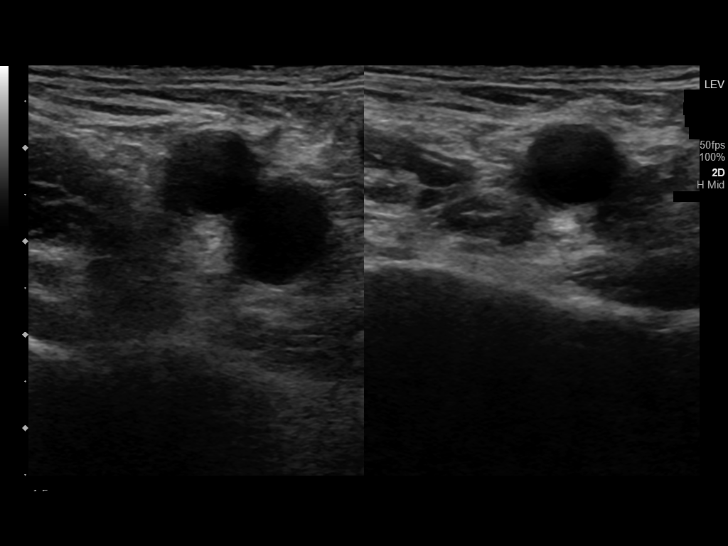
[im 4/44]
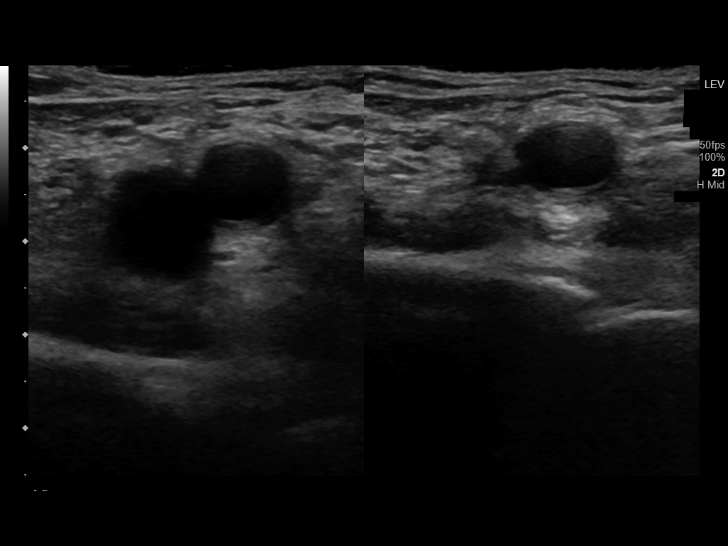
[im 8/44]
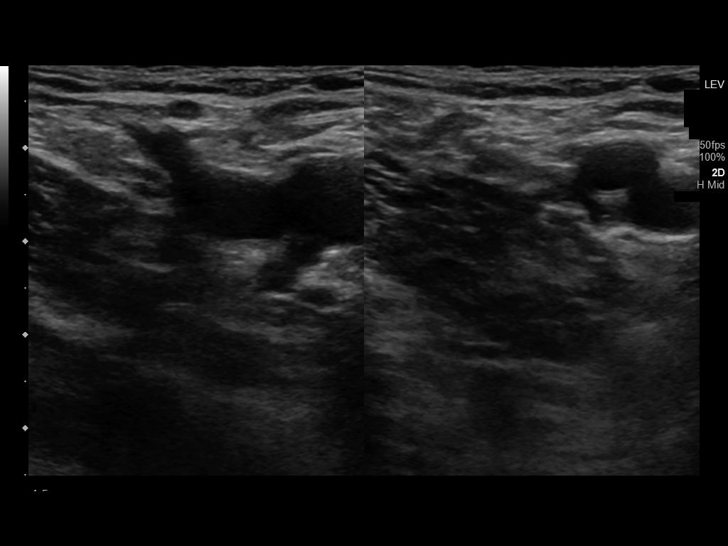
[im 12/44]
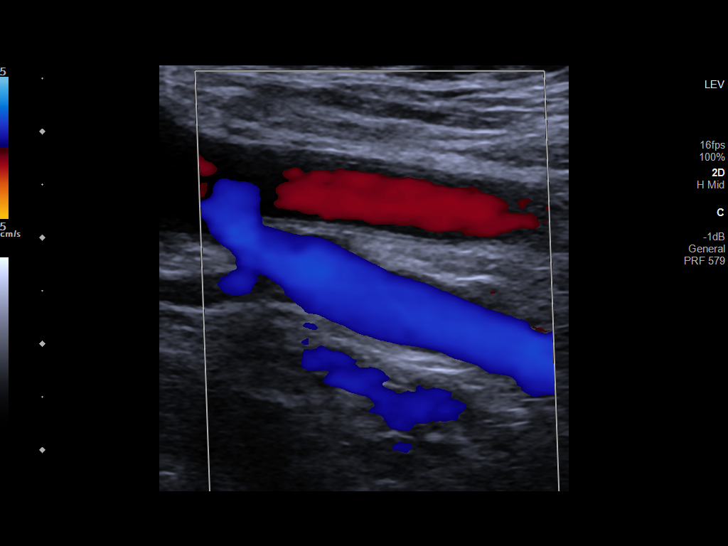
[im 14/44]
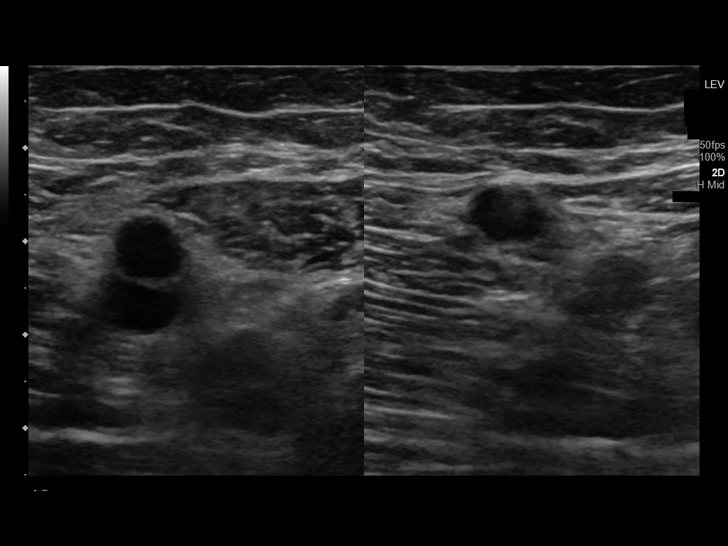
[im 17/44]
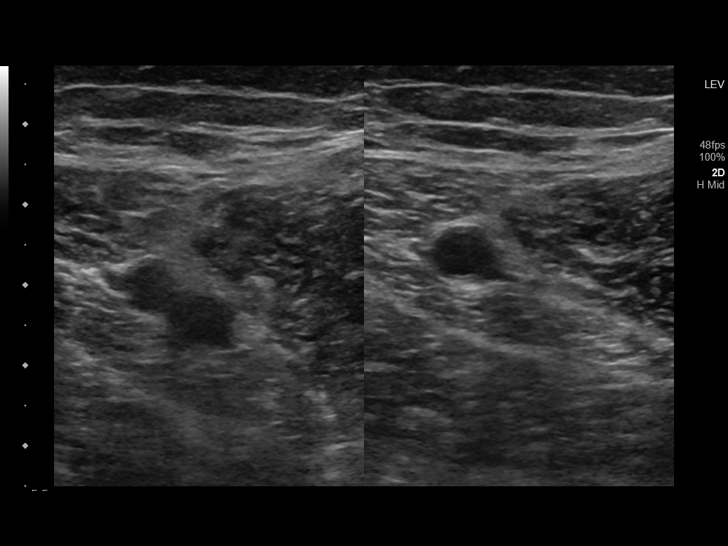
[im 21/44]
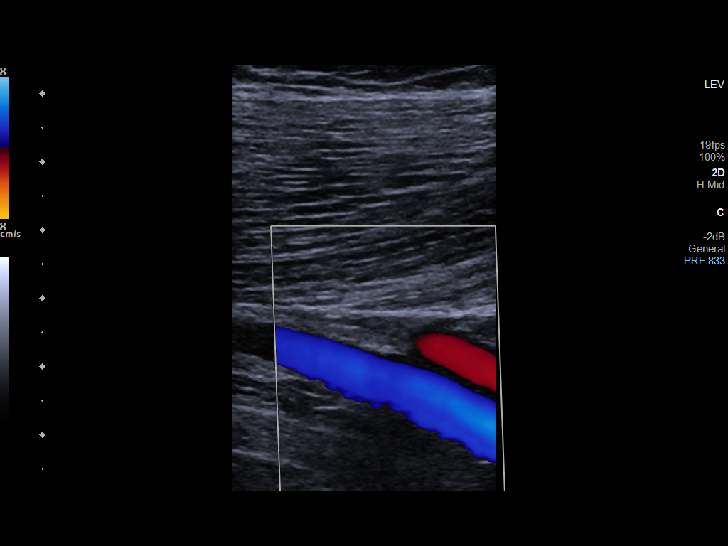
[im 23/44]
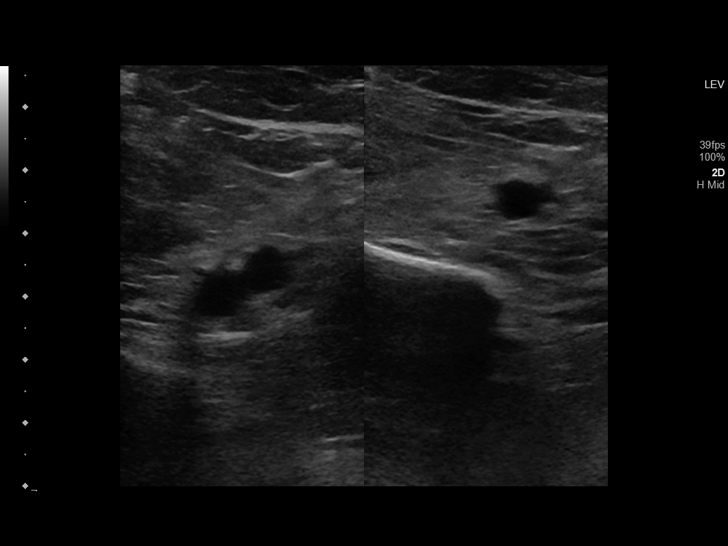
[im 27/44]
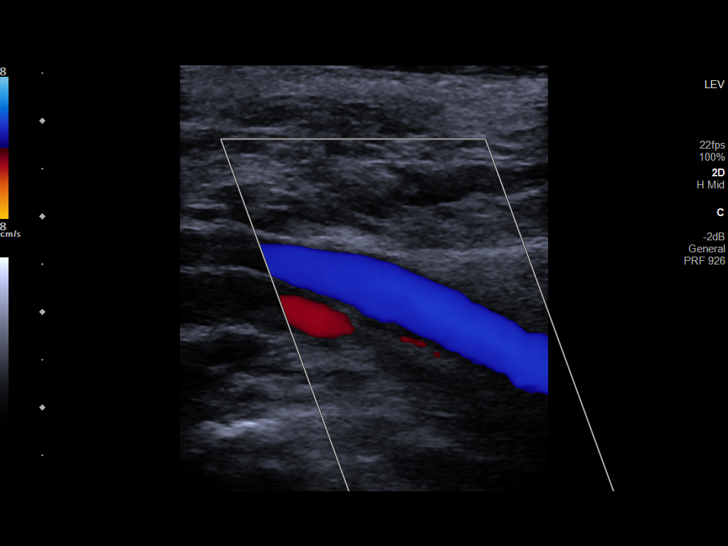
[im 30/44]
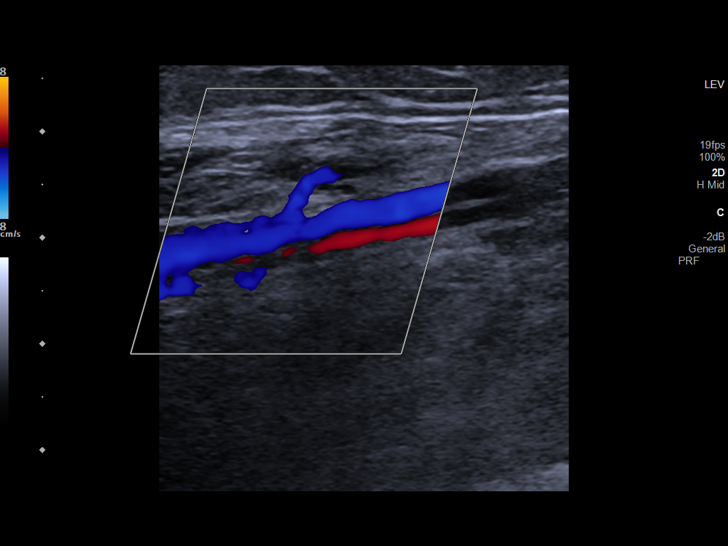
[im 34/44]
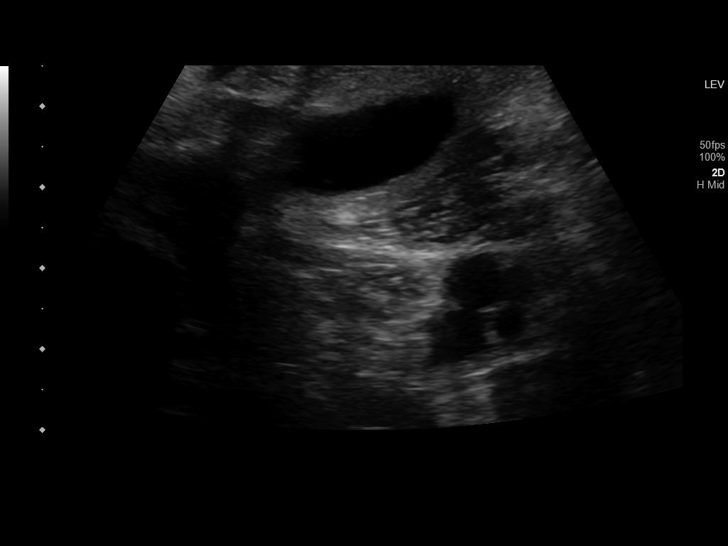
[im 36/44]
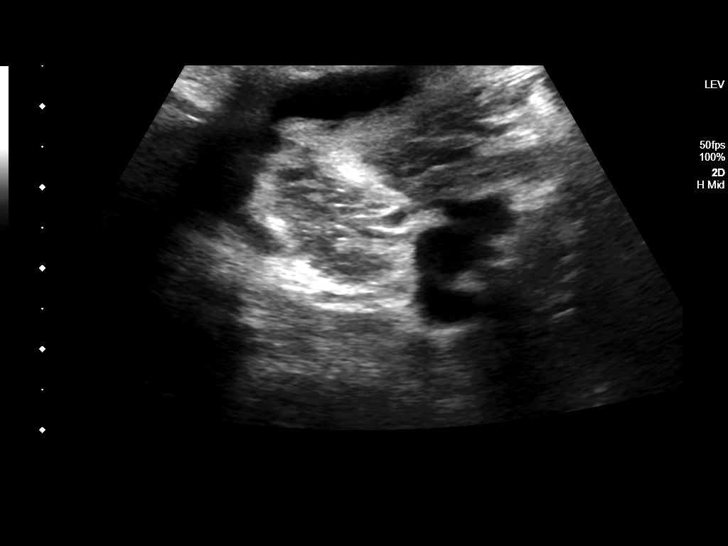
[im 40/44]
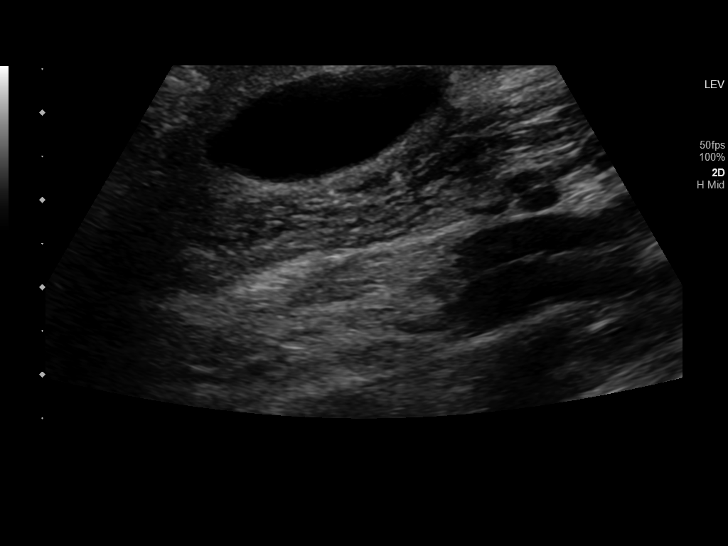
[im 44/44]
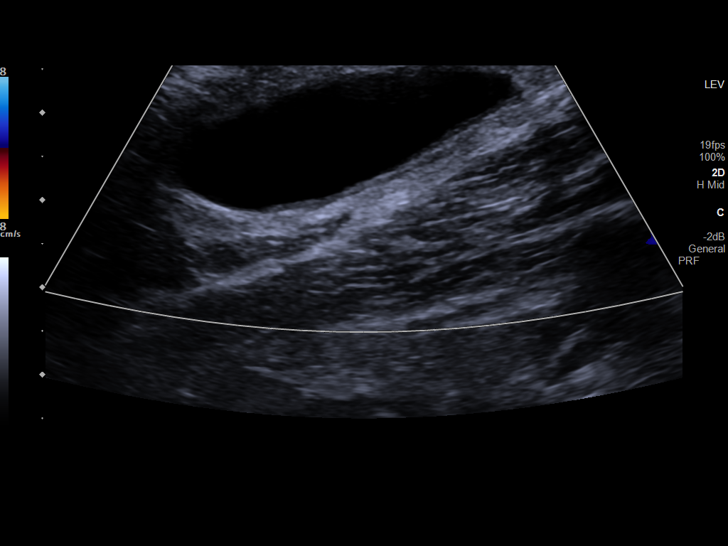

[14 of 24 positions shown; findings below may reference images not displayed]

FINDINGS: VENOUS

Normal compressibility of the common femoral, superficial femoral,
and popliteal veins, as well as the visualized calf veins.
Visualized portions of profunda femoral vein and great saphenous
vein unremarkable. No filling defects to suggest DVT on grayscale or
color Doppler imaging. Doppler waveforms show normal direction of
venous flow, normal respiratory plasticity and response to
augmentation.

Limited views of the contralateral common femoral vein are
unremarkable.

OTHER

There is a Baker's cyst within the popliteal fossa measuring up to
4.2 cm in diameter.

Limitations: none
IMPRESSION: 1. No evidence of acute left lower extremity DVT.
2. Moderate-sized Baker's cyst.

## 2024-01-12 IMAGING — CR DG KNEE COMPLETE 4+V*L*
1 series · 4 of 4 positions shown · non-contrast
Comparison: None.

CLINICAL DATA: Pain and swelling. Lump in the posterior knee. No
recent injury.

EXAM:
LEFT KNEE - COMPLETE 4+ VIEW

[Series 1: dg knee complete 4 views left · 0.14mm/px · 4 of 4 slices shown]
[im 1/4]
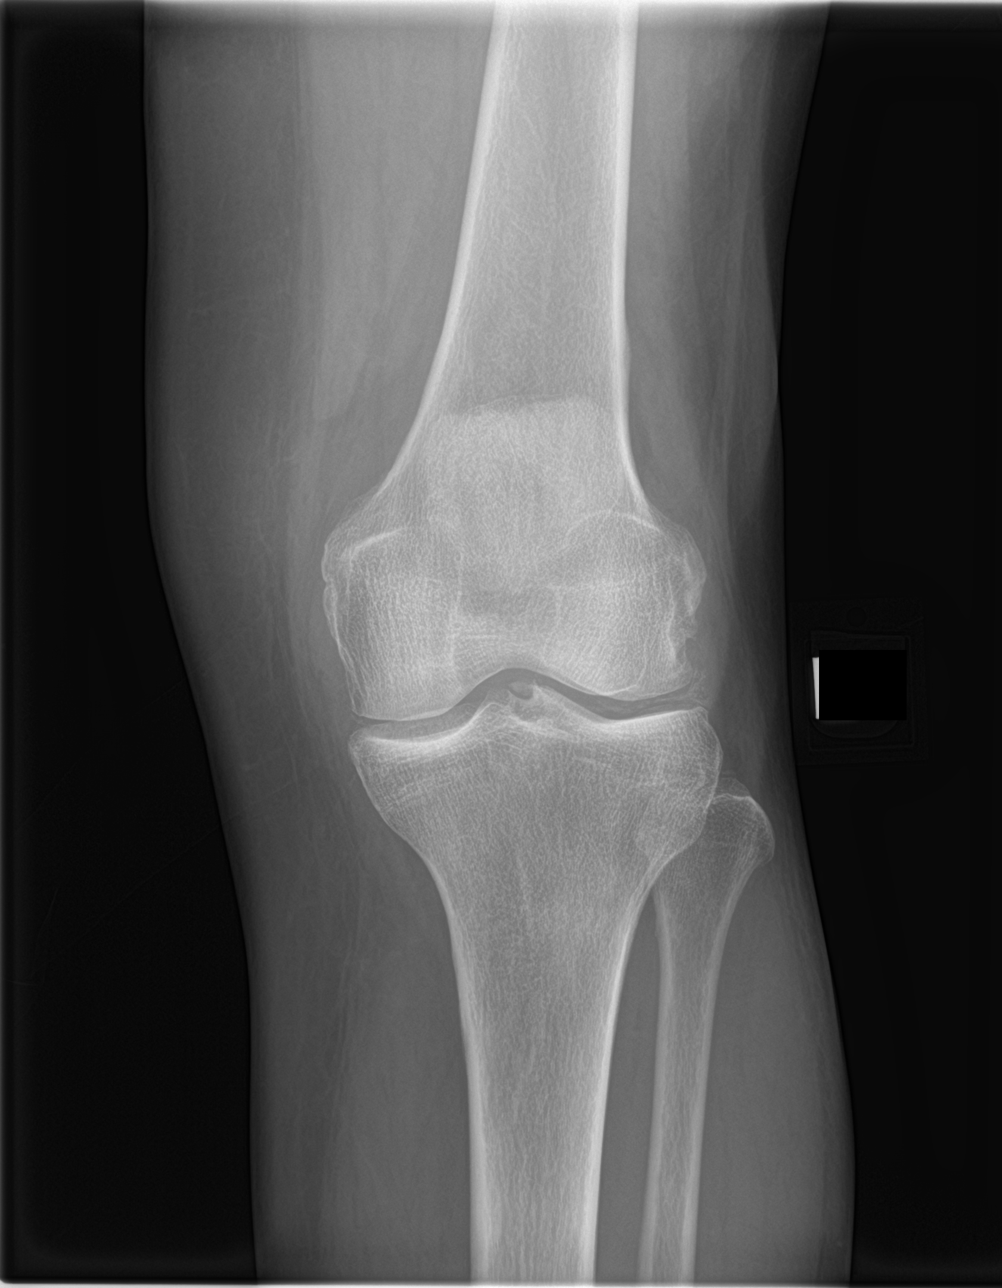
[im 2/4]
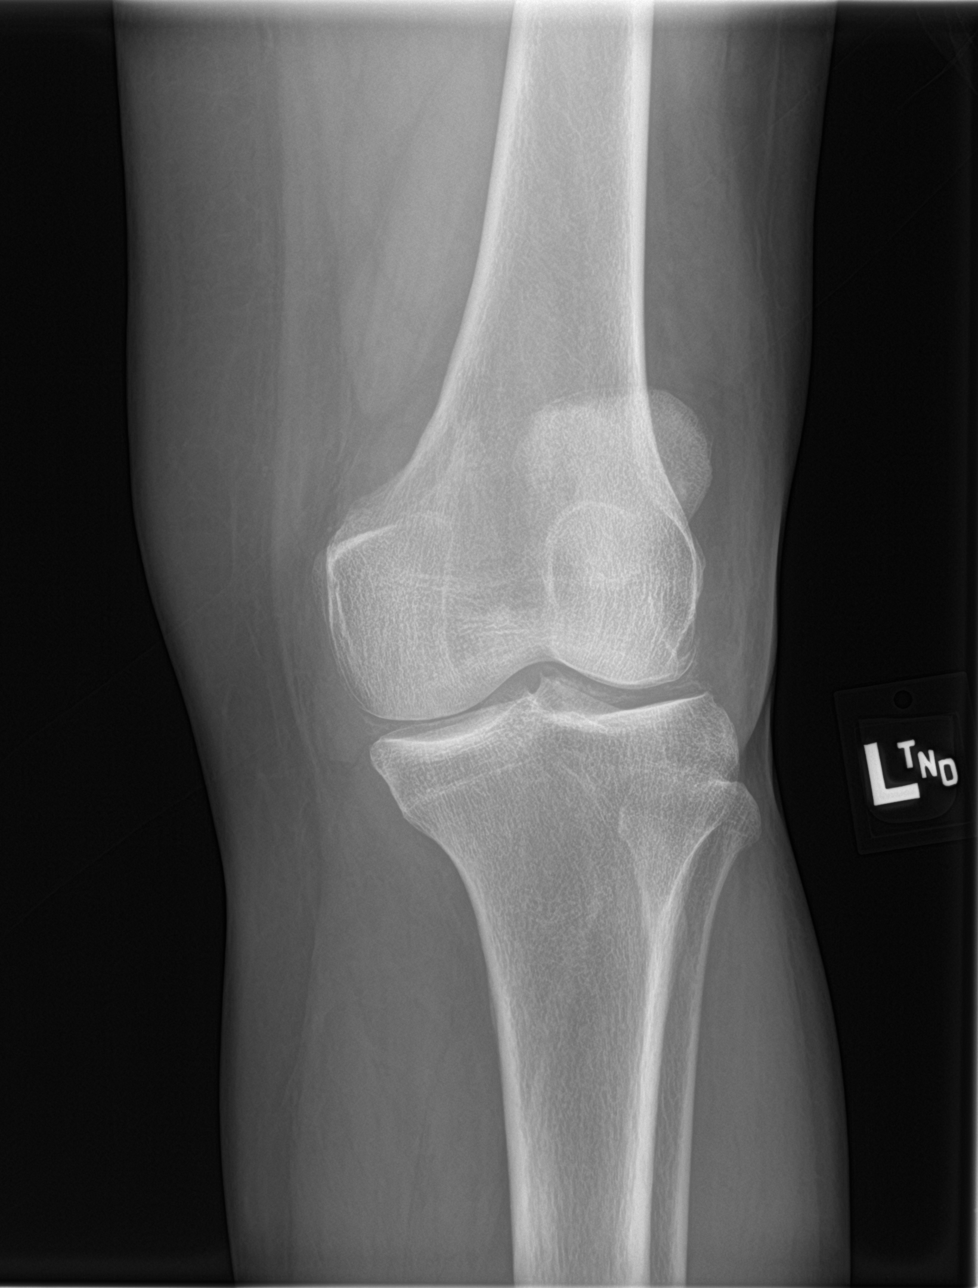
[im 3/4]
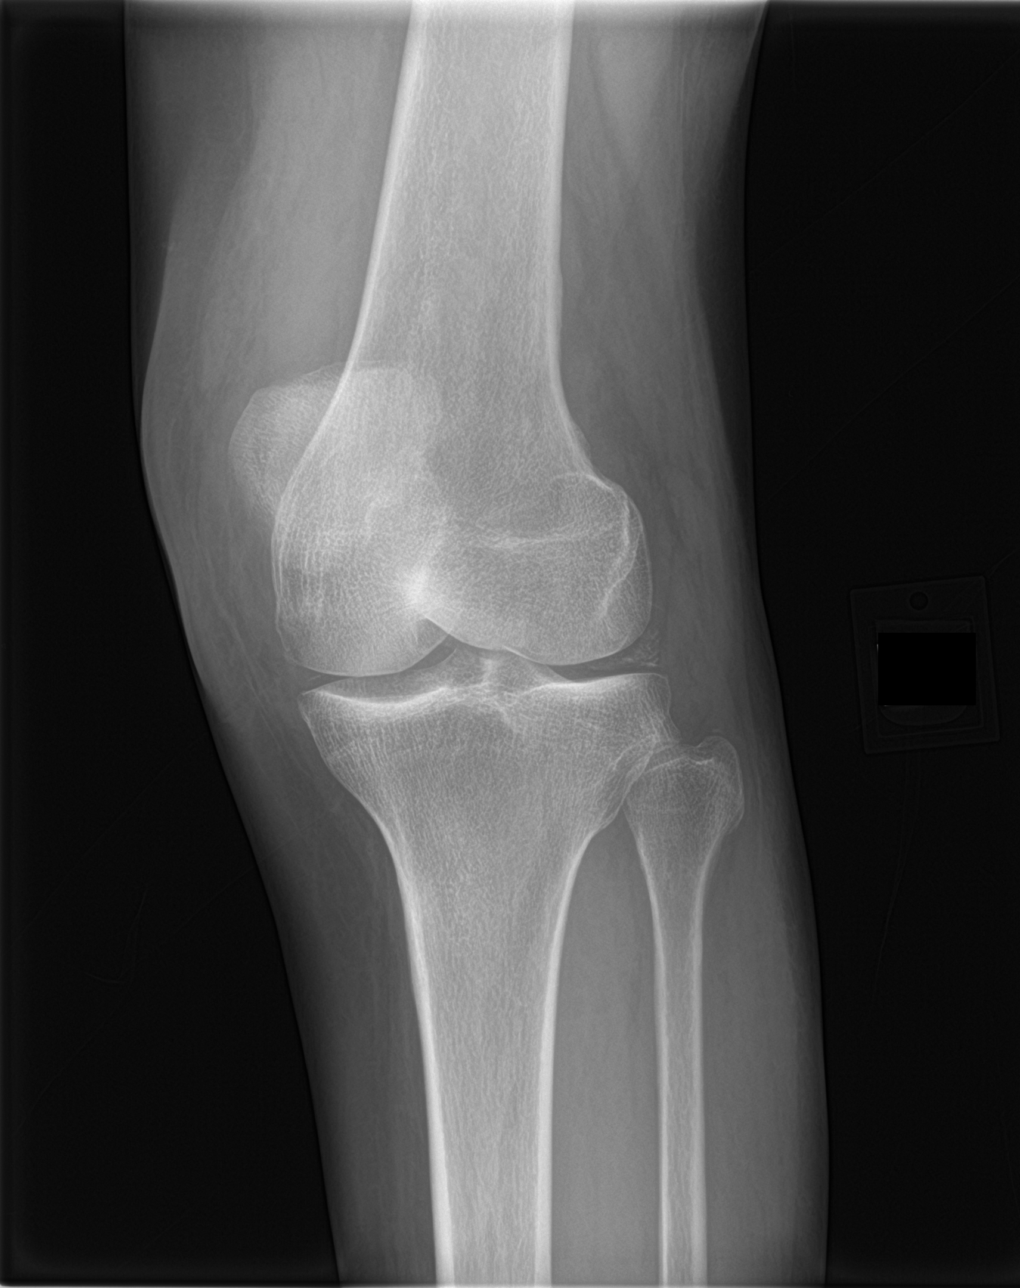
[im 4/4]
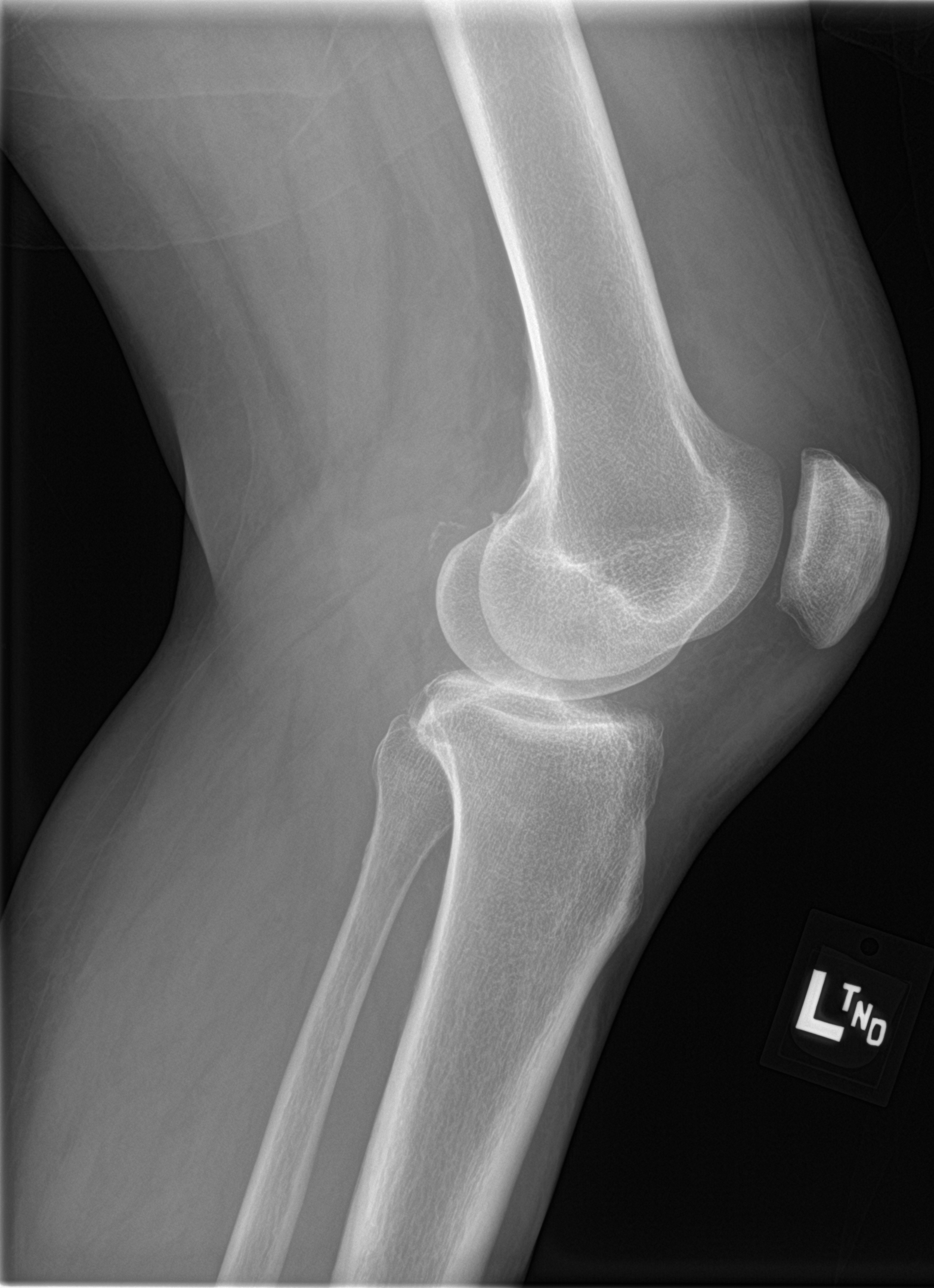

[4 of 4 positions shown; findings below may reference images not displayed]

FINDINGS: A suprapatellar joint effusion is identified. Chondrocalcinosis is
identified, particularly in the lateral compartment. There may be a
loose body within the joint best seen on the frontal view. No acute
fracture. No bony erosion. No other acute abnormalities.
IMPRESSION: 1. Suprapatellar joint effusion.
2. Chondrocalcinosis in the lateral compartment consistent with
CPPD.
3. Questioned loose body in the joint seen only on the frontal view.

## 2024-05-18 ENCOUNTER — Emergency Department

## 2024-05-18 ENCOUNTER — Inpatient Hospital Stay
Admission: EM | Admit: 2024-05-18 | Discharge: 2024-05-24 | DRG: 896 | Disposition: A | Discharge status: Home / Self Care | Attending: Internal Medicine | Admitting: Internal Medicine

## 2024-05-18 ENCOUNTER — Other Ambulatory Visit: Payer: Self-pay

## 2024-05-18 DIAGNOSIS — E860 Dehydration: Secondary | ICD-10-CM | POA: Diagnosis present

## 2024-05-18 DIAGNOSIS — Z7982 Long term (current) use of aspirin: Secondary | ICD-10-CM

## 2024-05-18 DIAGNOSIS — Z79899 Other long term (current) drug therapy: Secondary | ICD-10-CM

## 2024-05-18 DIAGNOSIS — F1410 Cocaine abuse, uncomplicated: Principal | ICD-10-CM | POA: Diagnosis present

## 2024-05-18 DIAGNOSIS — I674 Hypertensive encephalopathy: Principal | ICD-10-CM | POA: Diagnosis present

## 2024-05-18 DIAGNOSIS — Z8249 Family history of ischemic heart disease and other diseases of the circulatory system: Secondary | ICD-10-CM

## 2024-05-18 DIAGNOSIS — G4710 Hypersomnia, unspecified: Secondary | ICD-10-CM | POA: Diagnosis present

## 2024-05-18 DIAGNOSIS — G928 Other toxic encephalopathy: Secondary | ICD-10-CM | POA: Diagnosis present

## 2024-05-18 DIAGNOSIS — N179 Acute kidney failure, unspecified: Secondary | ICD-10-CM | POA: Diagnosis present

## 2024-05-18 DIAGNOSIS — R112 Nausea with vomiting, unspecified: Principal | ICD-10-CM | POA: Diagnosis present

## 2024-05-18 DIAGNOSIS — Z8711 Personal history of peptic ulcer disease: Secondary | ICD-10-CM

## 2024-05-18 DIAGNOSIS — I160 Hypertensive urgency: Secondary | ICD-10-CM | POA: Diagnosis present

## 2024-05-18 DIAGNOSIS — F1910 Other psychoactive substance abuse, uncomplicated: Secondary | ICD-10-CM | POA: Diagnosis present

## 2024-05-18 DIAGNOSIS — F17210 Nicotine dependence, cigarettes, uncomplicated: Secondary | ICD-10-CM | POA: Diagnosis present

## 2024-05-18 DIAGNOSIS — I10 Essential (primary) hypertension: Secondary | ICD-10-CM | POA: Diagnosis present

## 2024-05-18 DIAGNOSIS — I2510 Atherosclerotic heart disease of native coronary artery without angina pectoris: Secondary | ICD-10-CM | POA: Diagnosis present

## 2024-05-18 DIAGNOSIS — K219 Gastro-esophageal reflux disease without esophagitis: Secondary | ICD-10-CM | POA: Diagnosis present

## 2024-05-18 LAB — COMPREHENSIVE METABOLIC PANEL WITH GFR
ALT: 16 U/L (ref 0–44)
AST: 32 U/L (ref 15–41)
Albumin: 4.5 g/dL (ref 3.5–5.0)
Alkaline Phosphatase: 82 U/L (ref 38–126)
Anion gap: 14 (ref 5–15)
BUN: 17 mg/dL (ref 8–23)
CO2: 24 mmol/L (ref 22–32)
Calcium: 9.5 mg/dL (ref 8.9–10.3)
Chloride: 99 mmol/L (ref 98–111)
Creatinine, Ser: 0.83 mg/dL (ref 0.44–1.00)
GFR, Estimated: >60 mL/min (ref >60)
Glucose, Bld: 101 mg/dL — ABNORMAL HIGH (ref 70–99)
Potassium: 3.7 mmol/L (ref 3.5–5.1)
Sodium: 137 mmol/L (ref 135–145)
Total Bilirubin: 0.6 mg/dL (ref 0.0–1.2)
Total Protein: 8.5 g/dL — ABNORMAL HIGH (ref 6.5–8.1)

## 2024-05-18 LAB — CBC
HCT: 44.7 % (ref 36.0–46.0)
Hemoglobin: 13.6 g/dL (ref 12.0–15.0)
MCH: 28.7 pg (ref 26.0–34.0)
MCHC: 30.4 g/dL (ref 30.0–36.0)
MCV: 94.3 fL (ref 80.0–100.0)
Platelets: 277 K/uL (ref 150–400)
RBC: 4.74 MIL/uL (ref 3.87–5.11)
RDW: 13.9 % (ref 11.5–15.5)
WBC: 6 K/uL (ref 4.0–10.5)
nRBC: 0 % (ref 0.0–0.2)

## 2024-05-18 LAB — LIPASE, BLOOD: Lipase: 27 U/L (ref 11–51)

## 2024-05-18 LAB — TROPONIN I (HIGH SENSITIVITY): Troponin I (High Sensitivity): 3 ng/L (ref <18)

## 2024-05-18 LAB — TSH: TSH: 1.369 u[IU]/mL (ref 0.350–4.500)

## 2024-05-18 MED ORDER — CALCIUM CARBONATE ANTACID 500 MG PO CHEW: 1.0000 | CHEWABLE_TABLET | Freq: Three times a day (TID) | ORAL | Status: DC | PRN

## 2024-05-18 MED ORDER — ONDANSETRON HCL 4 MG/2ML IJ SOLN
4.0000 mg | Freq: Four times a day (QID) | INTRAMUSCULAR | Status: DC | PRN
  Administered 2024-05-22 – 2024-05-24 (×4): 4 mg via INTRAVENOUS
  Filled 2024-05-18 (×4): qty 2

## 2024-05-18 MED ORDER — POLYETHYLENE GLYCOL 3350 17 G PO PACK
17.0000 g | PACK | Freq: Every day | ORAL | Status: DC
  Administered 2024-05-19 – 2024-05-23 (×7): 17 g via ORAL
  Filled 2024-05-18 (×6): qty 1

## 2024-05-18 MED ORDER — ACETAMINOPHEN 650 MG RE SUPP: 650.0000 mg | Freq: Four times a day (QID) | RECTAL | Status: DC | PRN

## 2024-05-18 MED ORDER — ASPIRIN 81 MG PO TBEC
81.0000 mg | DELAYED_RELEASE_TABLET | Freq: Every day | ORAL | Status: DC
Start: 2024-05-19 — End: 2024-05-24
  Administered 2024-05-19 – 2024-05-24 (×8): 81 mg via ORAL
  Filled 2024-05-18 (×6): qty 1

## 2024-05-18 MED ORDER — NICOTINE 21 MG/24HR TD PT24
21.0000 mg | MEDICATED_PATCH | Freq: Every day | TRANSDERMAL | Status: DC
  Administered 2024-05-18 – 2024-05-24 (×10): 21 mg via TRANSDERMAL
  Filled 2024-05-18 (×7): qty 1

## 2024-05-18 MED ORDER — ONDANSETRON HCL 4 MG/2ML IJ SOLN
4.0000 mg | Freq: Once | INTRAMUSCULAR | Status: AC
  Administered 2024-05-18 (×2): 4 mg via INTRAVENOUS
  Filled 2024-05-18: qty 2

## 2024-05-18 MED ORDER — ENOXAPARIN SODIUM 40 MG/0.4ML IJ SOSY
40.0000 mg | PREFILLED_SYRINGE | INTRAMUSCULAR | Status: DC
  Administered 2024-05-18 – 2024-05-22 (×8): 40 mg via SUBCUTANEOUS
  Filled 2024-05-18 (×5): qty 0.4

## 2024-05-18 MED ORDER — PANTOPRAZOLE SODIUM 40 MG PO TBEC
40.0000 mg | DELAYED_RELEASE_TABLET | Freq: Every day | ORAL | Status: DC
  Administered 2024-05-18 – 2024-05-24 (×10): 40 mg via ORAL
  Filled 2024-05-18 (×7): qty 1

## 2024-05-18 MED ORDER — LABETALOL HCL 5 MG/ML IV SOLN
10.0000 mg | Freq: Once | INTRAVENOUS | Status: AC
  Administered 2024-05-18 (×2): 10 mg via INTRAVENOUS
  Filled 2024-05-18: qty 4

## 2024-05-18 MED ORDER — NICARDIPINE HCL IN NACL 20-0.86 MG/200ML-% IV SOLN
3.0000 mg/h | INTRAVENOUS | Status: DC
  Administered 2024-05-18 (×2): 5 mg/h via INTRAVENOUS
  Filled 2024-05-18: qty 200

## 2024-05-18 MED ORDER — LISINOPRIL 20 MG PO TABS
20.0000 mg | ORAL_TABLET | Freq: Every day | ORAL | Status: DC
  Administered 2024-05-18 – 2024-05-21 (×7): 20 mg via ORAL
  Filled 2024-05-18: qty 2
  Filled 2024-05-18: qty 1
  Filled 2024-05-18: qty 2
  Filled 2024-05-18: qty 1

## 2024-05-18 MED ORDER — HYDRALAZINE HCL 20 MG/ML IJ SOLN
20.0000 mg | Freq: Once | INTRAMUSCULAR | Status: AC
  Administered 2024-05-18 (×2): 20 mg via INTRAVENOUS
  Filled 2024-05-18: qty 1

## 2024-05-18 MED ORDER — ACETAMINOPHEN 325 MG PO TABS
650.0000 mg | ORAL_TABLET | Freq: Four times a day (QID) | ORAL | Status: DC | PRN
  Administered 2024-05-20 – 2024-05-23 (×5): 650 mg via ORAL
  Filled 2024-05-18 (×4): qty 2

## 2024-05-18 MED ORDER — NALOXONE HCL 2 MG/2ML IJ SOSY
0.4000 mg | PREFILLED_SYRINGE | Freq: Once | INTRAMUSCULAR | Status: AC
  Administered 2024-05-18 (×2): 0.4 mg via INTRAVENOUS
  Filled 2024-05-18: qty 2

## 2024-05-18 MED ORDER — ONDANSETRON HCL 4 MG PO TABS
4.0000 mg | ORAL_TABLET | Freq: Four times a day (QID) | ORAL | Status: DC | PRN
Start: 2024-05-18 — End: 2024-05-24

## 2024-05-18 NOTE — ED Notes (Signed)
 Pt admits to using cocaine last night. Pt BP elevated in triage and pt keeps falling asleep.

## 2024-05-18 NOTE — ED Triage Notes (Signed)
 Pt comes via EMs from work with c/o N/V. VSS

## 2024-05-18 NOTE — ED Notes (Signed)
 Secure chatted Dr. Levander: about to start cardene  gtt when pt gets back from MRI. I just came onto shift. what MAP are you wanting me to get to on the drip? Didn't want to drop her too low since her baseline probably isn't normal pressures.

## 2024-05-18 NOTE — ED Notes (Addendum)
 Assumed care of this pt at 1900. Pt currently in MRI. Will start cardene  gtt when pt gets back to room

## 2024-05-18 NOTE — ED Provider Notes (Signed)
 Care of this patient assumed from prior physician at 1500 pending CT head, reassessment of blood pressure, and disposition. Please see prior physician note for further details.  Briefly, this is a 63 year old female who presents with vomiting, somnolence without focal deficits.  Labs overall reassuring but noted to be significantly, hypertensive on presentation here ordered for IV labetalol .  Pending CT head at time of signout, anticipated admission for MRI if mental status did not improve.  CT head without acute findings.  On reevaluation, patient remains somnolent.  Despite labetalol , persistent and actually worsened hypertension with BPs 250s over 120s.  I am concerned about a possible hypertensive encephalopathy.  I will order a Cardene  drip as well as an MRI to evaluate for stroke.  Will keep BP goal of 220/120 until MRI is obtained, but if this is without evidence of stroke may need further blood pressure control.  Will reach out to hospitalist team.  Case discussed with hospitalist team.  They will evaluate for anticipated admission.  Did request a urine drug screen which has been ordered.      Levander Slate, MD 05/18/24 [phone]

## 2024-05-18 NOTE — H&P (Addendum)
 History and Physical    Patient: Robin Mckinney FMW:[id number] DOB: 11/13/1960 DOA: 05/18/2024 DOS: the patient was seen and examined on 05/18/2024 PCP: [facility]  Patient coming from: Home  Chief Complaint:  Chief Complaint  Patient presents with   Nausea   HPI: Robin Mckinney is a 63 y.o. female with medical history significant of hypertension, CAD, GERD, PUD, and polysubstance abuse who presents to the ED complaining of nausea and vomiting.  Patient was sleeping but arousable, not willing to cooperate with me for interview.  History as per ED documentation Per EMS, patient called out from work with nausea and vomiting. Patient states that she feels nauseous but denies any abdominal pain or diarrhea, states that symptoms started today while she was at work. She was found to be significantly hypertensive and states she has not had her blood pressure medication for the past 24 hours after she ran out. She denies any chest pain, shortness of breath, headache, numbness, or weakness. She does admit to crack cocaine use earlier this morning before she went to work.   In the emergency department, she was hypertensive, somnolent but arousable, with nonfocal neurologic exam. No vomiting since arrival. Persistently hypertensive despite IV labetalol .  CT head without acute intracranial abnormality.  Urine has not been collected but on my exam pupils were pinpoint.  She was started on a nicardipine  drip for possible hypertensive encephalopathy.    Will give a dose of narcan  and admit to the hospitalist service for further evaluation/management of AMS    Review of Systems: Review of Systems  Unable to perform ROS: Mental status change    Past Medical History:  Diagnosis Date   Acute gastric ulcer with perforation (HCC) 10/24/2022   CAD (coronary artery disease)    GERD (gastroesophageal reflux disease)    H/O cesarean section    Hypertension    Tobacco abuse     Past Surgical History:  Procedure Laterality Date   CESAREAN SECTION     Social History:  reports that she has been smoking cigarettes. She has never used smokeless tobacco. She reports that she does not currently use alcohol. She reports current drug use. Drug: Cocaine.  No Known Allergies  Family History  Problem Relation Age of Onset   Hypertension Mother    Hypertension Father     Prior to Admission medications   Medication Sig Start Date End Date Taking? Authorizing Provider  aspirin  EC 81 MG tablet Take 1 tablet (81 mg total) by mouth daily. 10/22/22   Alexander, Natalie, DO  calcium  carbonate (TUMS - DOSED IN MG ELEMENTAL CALCIUM ) 500 MG chewable tablet Chew 1-2 tablets (200-400 mg of elemental calcium  total) by mouth 3 (three) times daily as needed for indigestion or heartburn. 10/22/22   Alexander, Natalie, DO  iron  polysaccharides (NIFEREX) 150 MG capsule Take 1 capsule (150 mg total) by mouth daily. 03/27/23 11/08/23  Von Bellis, MD  lisinopril  (ZESTRIL ) 20 MG tablet Take 1 tablet (20 mg total) by mouth daily. 03/24/23 03/23/24  Von Bellis, MD  nicotine  (NICODERM CQ  - DOSED IN MG/24 HOURS) 21 mg/24hr patch Place 1 patch (21 mg total) onto the skin daily. 10/22/22   Alexander, Natalie, DO  omeprazole  (PRILOSEC) 40 MG capsule Take 1 capsule (40 mg total) by mouth in the morning and at bedtime. 03/23/23 11/08/23  Von Bellis, MD  polyethylene glycol (MIRALAX  / GLYCOLAX ) 17 g packet Take 17 g by mouth daily. 10/22/22   Alexander, Natalie, DO  Physical Exam: Vitals:   05/18/24 1715 05/18/24 1730 05/18/24 1800 05/18/24 1809  BP: (!) 233/115 (!) 245/126 (!) 252/126   Pulse:  63 64   Resp: 16 (!) 22    Temp:    98 F (36.7 C)  TempSrc:    Oral  SpO2:  99% 97%   Weight:      Height:      Physical Exam Vitals and nursing note reviewed.  Constitutional:      General: She is not in acute distress.    Appearance: She is not toxic-appearing.     Comments: Sleeping but  arousable, intermittently responded yes and no  Laying on side in fetal position   HENT:     Head: Normocephalic and atraumatic.  Eyes:     Extraocular Movements: Extraocular movements intact.     Right eye: No nystagmus.     Left eye: No nystagmus.     Conjunctiva/sclera:     Right eye: Right conjunctiva is injected. No exudate.    Left eye: Left conjunctiva is injected. No exudate.    Comments: Held eyes shut then voluntarily opened them  Pupils pinpoint and reactive BL  B/l conjunctival injection   Cardiovascular:     Rate and Rhythm: Normal rate and regular rhythm.  Pulmonary:     Effort: Pulmonary effort is normal. No respiratory distress.     Breath sounds: Normal breath sounds. No wheezing.  Abdominal:     General: Bowel sounds are normal. There is no distension.     Palpations: Abdomen is soft.     Tenderness: There is no abdominal tenderness.  Musculoskeletal:     Cervical back: Neck supple.     Right lower leg: No edema.     Left lower leg: No edema.  Skin:    General: Skin is warm and dry.     Coloration: Skin is not jaundiced.     Findings: No bruising.  Neurological:     Mental Status: She is lethargic.     GCS: GCS eye subscore is 3. GCS verbal subscore is 5. GCS motor subscore is 5.     Cranial Nerves: No facial asymmetry.     Motor: No tremor or atrophy.     Deep Tendon Reflexes: Reflexes abnormal.  Psychiatric:     Comments: Unable to assess      Data Reviewed:    Labs on Admission: I have personally reviewed following labs and imaging studies  CBC: Recent Labs  Lab 05/18/24 1343  WBC 6.0  HGB 13.6  HCT 44.7  MCV 94.3  PLT 277   Basic Metabolic Panel: Recent Labs  Lab 05/18/24 1343  NA 137  K 3.7  CL 99  CO2 24  GLUCOSE 101*  BUN 17  CREATININE 0.83  CALCIUM  9.5   GFR: Estimated Creatinine Clearance: 63.3 mL/min (by C-G formula based on SCr of 0.83 mg/dL). Liver Function Tests: Recent Labs  Lab 05/18/24 1343  AST 32  ALT  16  ALKPHOS 82  BILITOT 0.6  PROT 8.5*  ALBUMIN 4.5   Recent Labs  Lab 05/18/24 1343  LIPASE 27   No results for input(s): AMMONIA in the last 168 hours. Coagulation Profile: No results for input(s): INR, PROTIME in the last 168 hours. Cardiac Enzymes: No results for input(s): CKTOTAL, CKMB, CKMBINDEX, TROPONINI in the last 168 hours. BNP (last 3 results) No results for input(s): PROBNP in the last 8760 hours. HbA1C: No results for input(s): HGBA1C in the  last 72 hours. CBG: No results for input(s): GLUCAP in the last 168 hours. Lipid Profile: No results for input(s): CHOL, HDL, LDLCALC, TRIG, CHOLHDL, LDLDIRECT in the last 72 hours. Thyroid Function Tests: No results for input(s): TSH, T4TOTAL, FREET4, T3FREE, THYROIDAB in the last 72 hours. Anemia Panel: No results for input(s): VITAMINB12, FOLATE, FERRITIN, TIBC, IRON , RETICCTPCT in the last 72 hours. Urine analysis:    Component Value Date/Time   COLORURINE YELLOW (A) 11/08/2023 0832   APPEARANCEUR CLOUDY (A) 11/08/2023 0832   LABSPEC 1.020 11/08/2023 0832   PHURINE 5.0 11/08/2023 0832   GLUCOSEU NEGATIVE 11/08/2023 0832   HGBUR MODERATE (A) 11/08/2023 0832   BILIRUBINUR NEGATIVE 11/08/2023 0832   KETONESUR NEGATIVE 11/08/2023 0832   PROTEINUR 100 (A) 11/08/2023 0832   UROBILINOGEN 0.2 09/05/2009 1200   NITRITE NEGATIVE 11/08/2023 0832   LEUKOCYTESUR MODERATE (A) 11/08/2023 0832    Radiological Exams on Admission: MR BRAIN WO CONTRAST Result Date: 05/18/2024 CLINICAL DATA:  Initial evaluation for acute mental status change. EXAM: MRI HEAD WITHOUT CONTRAST TECHNIQUE: Multiplanar, multiecho pulse sequences of the brain and surrounding structures were obtained without intravenous contrast. COMPARISON:  CT from earlier the same day. FINDINGS: Brain: Examination moderately degraded by motion artifact. Cerebral volume within normal limits. Patchy T2/FLAIR  hyperintensity involving the periventricular deep white matter, consistent with chronic small vessel ischemic disease, mild for age. No evidence for acute or subacute infarct. No areas of chronic cortical infarction. No acute or chronic intracranial blood products. No mass lesion, midline shift or mass effect no hydrocephalus or extra-axial fluid collection. Pituitary gland within normal limits. Vascular: Major intracranial vascular flow voids are maintained. Skull and upper cervical spine: Craniocervical junction within normal limits. Bone marrow signal intensity overall within normal limits. No scalp soft tissue abnormality. Sinuses/Orbits: Globes orbital soft tissues within normal limits. Paranasal sinuses are largely clear. No significant mastoid effusion. Other: None. IMPRESSION: 1. No acute intracranial abnormality. 2. Mild chronic microvascular ischemic disease for age. Electronically Signed   By: Morene Hoard M.D.   On: 05/18/2024 20:16   CT Head Wo Contrast Result Date: 05/18/2024 CLINICAL DATA:  Hypertensive. EXAM: CT HEAD WITHOUT CONTRAST TECHNIQUE: Contiguous axial images were obtained from the base of the skull through the vertex without intravenous contrast. RADIATION DOSE REDUCTION: This exam was performed according to the departmental dose-optimization program which includes automated exposure control, adjustment of the mA and/or kV according to patient size and/or use of iterative reconstruction technique. COMPARISON:  November 08, 2023 FINDINGS: Brain: No evidence of acute infarction, hemorrhage, hydrocephalus, extra-axial collection or mass lesion/mass effect. Vascular: No hyperdense vessel or unexpected calcification. Skull: Normal. Negative for fracture or focal lesion. Sinuses/Orbits: Mild posterior left ethmoid sinus mucosal thickening is seen. Other: None. IMPRESSION: No acute intracranial abnormality. Electronically Signed   By: Suzen Dials M.D.   On: 05/18/2024 17:52        Assessment and Plan:  63 y.o. female with medical history significant of hypertension, CAD, GERD, PUD, and polysubstance abuse who presents to the ED complaining of nausea and vomiting.  Hypertensive and hypersomnolent on arrival.  No vomiting noted.  Workup so far including CT head unremarkable. Admitted for evaluation of altered mental status/ treatment of hypertension vs hypertensive encephalopathy   AMS/hypersomnolence  - suspect related to substance abuse, either opioid intoxication or cocaine withdrawal. Admitted to cocaine use in triage.  CT head unremarkable.  Moves all extremities spontaneously but only intermittently following commands due to hypersomnolence - trial narcan  while UDS  pending  - ddx hypertensive encephalopathy, post ictal state, hypercarbia.  No significant metabolic derangement or evidence of acute infectious process at this time - f/u MRI head, ABG, UDS, UA, TSH    Hypertension, uncontrolled  - Insetting of cocaine use and running out of home antihypertensives.   Neuroexam is nonfocal, consistent with hypersomnolence. GCS 14.  Otherwise no evidence of endorgan damage.  Reported nausea and vomiting prompting call to EMS seems to be resolved at this point - continue nicardipine  gtt started in the ED for now with goal reduction 25% tonight  - Resume home antihypertensives  - will need close PCP f/u   CAD  - resume asa, statin, ACE-I    Lovenox   Heart healthy diet  Monitor/replace electrolytes  No ivf   Advance Care Planning:   Code Status: Prior  assume full     Severity of Illness: The appropriate patient status for this patient is OBSERVATION. Observation status is judged to be reasonable and necessary in order to provide the required intensity of service to ensure the patient's safety. The patient's presenting symptoms, physical exam findings, and initial radiographic and laboratory data in the context of their medical condition is felt to place them  at decreased risk for further clinical deterioration. Furthermore, it is anticipated that the patient will be medically stable for discharge from the hospital within 2 midnights of admission.   Author: Daved JAYSON Pump, DO 05/18/2024 7:34 PM  For on call review [email or web address].

## 2024-05-18 NOTE — ED Notes (Signed)
 Attempted to call nurse and let know about pt. No answer

## 2024-05-18 NOTE — ED Provider Notes (Signed)
 [facility] Provider Note    Event Date/Time   First MD Initiated Contact with Patient 05/18/24 1407     (approximate)   History   Chief Complaint Nausea   HPI  Robin Mckinney is a 63 y.o. female with past medical history of hypertension, CAD, GERD, PUD, and polysubstance abuse who presents to the ED complaining of nausea and vomiting.  Per EMS, patient called out from work with nausea and vomiting.  Patient states that she feels nauseous but denies any abdominal pain or diarrhea, states that symptoms started today while she was at work.  She was found to be significantly hypertensive and states she has not had her blood pressure medication for the past 24 hours after she ran out.  She denies any chest pain, shortness of breath, headache, numbness, or weakness.  She does admit to crack cocaine use earlier this morning before she went to work.     Physical Exam   Triage Vital Signs: ED Triage Vitals  Encounter Vitals Group     BP 05/18/24 1336 (!) 238/137     Girls Systolic BP Percentile --      Girls Diastolic BP Percentile --      Boys Systolic BP Percentile --      Boys Diastolic BP Percentile --      Pulse Rate 05/18/24 1336 70     Resp 05/18/24 1336 18     Temp 05/18/24 1336 98.2 F (36.8 C)     Temp Source 05/18/24 1336 Oral     SpO2 05/18/24 1336 96 %     Weight 05/18/24 1336 145 lb (65.8 kg)     Height 05/18/24 1336 5' 3 (1.6 m)     Head Circumference --      Peak Flow --      Pain Score 05/18/24 1335 5     Pain Loc --      Pain Education --      Exclude from Growth Chart --     Most recent vital signs: Vitals:   05/18/24 1430 05/18/24 1456  BP: (!) 255/136 (!) 239/115  Pulse: 73 70  Resp: (!) 23 20  Temp:    SpO2: 100% 97%    Constitutional: Somnolent but arousable to voice, oriented to person, place, time, and situation. Eyes: Conjunctivae are normal. Head: Atraumatic. Nose: No congestion/rhinnorhea. Mouth/Throat:  Mucous membranes are moist.  Cardiovascular: Normal rate, regular rhythm. Grossly normal heart sounds.  2+ radial pulses bilaterally. Respiratory: Normal respiratory effort.  No retractions. Lungs CTAB. Gastrointestinal: Soft and nontender. No distention. Musculoskeletal: No lower extremity tenderness nor edema.  Neurologic:  Normal speech and language. No gross focal neurologic deficits are appreciated.    ED Results / Procedures / Treatments   Labs (all labs ordered are listed, but only abnormal results are displayed) Labs Reviewed  COMPREHENSIVE METABOLIC PANEL WITH GFR - Abnormal; Notable for the following components:      Result Value   Glucose, Bld 101 (*)    Total Protein 8.5 (*)    All other components within normal limits  LIPASE, BLOOD  CBC  URINALYSIS, ROUTINE W REFLEX MICROSCOPIC  TROPONIN I (HIGH SENSITIVITY)     EKG  ED ECG REPORT I, Carlin Palin, the attending physician, personally viewed and interpreted this ECG.   Date: 05/18/2024  EKG Time: 13:41  Rate: 67  Rhythm: normal sinus rhythm  Axis: Normal  Intervals:first-degree A-V block   ST&T Change: None  PROCEDURES:  Critical Care performed: No  Procedures   MEDICATIONS ORDERED IN ED: Medications  ondansetron  (ZOFRAN ) injection 4 mg (4 mg Intravenous Given 05/18/24 1452)  labetalol  (NORMODYNE ) injection 10 mg (10 mg Intravenous Given 05/18/24 1452)     IMPRESSION / MDM / ASSESSMENT AND PLAN / ED COURSE  I reviewed the triage vital signs and the nursing notes.                              63 y.o. female with past medical history of hypertension, CAD, GERD, PUD, and polysubstance abuse who presents to the ED for nausea and vomiting at work, noted to be somnolent here in the ED.  Patient's presentation is most consistent with acute presentation with potential threat to life or bodily function.  Differential diagnosis includes, but is not limited to, SAH, stroke, uncontrolled hypertension,  ACS, AKI, gastroenteritis, dehydration, electrolyte abnormality, AKI.  Patient nontoxic-appearing and in no acute distress, vital signs remarkable for hypertension but otherwise reassuring.  She is somnolent but easily arousable to voice, has a nonfocal neurologic exam.  Labs without significant anemia, leukocytosis, electrolyte abnormality, or AKI.  LFTs are unremarkable and lipase within normal limits.  Will check CT head given hypertension and somnolence, will give IV Zofran  and labetalol .  Patient turned over to oncoming rider pending CT head results and reassessment.      FINAL CLINICAL IMPRESSION(S) / ED DIAGNOSES   Final diagnoses:  Nausea and vomiting, unspecified vomiting type  Uncontrolled hypertension     Rx / DC Orders   ED Discharge Orders     None        Note:  This document was prepared using Dragon voice recognition software and may include unintentional dictation errors.   Willo Dunnings, MD 05/18/24 1544

## 2024-05-19 DIAGNOSIS — N179 Acute kidney failure, unspecified: Secondary | ICD-10-CM | POA: Diagnosis present

## 2024-05-19 DIAGNOSIS — I2510 Atherosclerotic heart disease of native coronary artery without angina pectoris: Secondary | ICD-10-CM | POA: Diagnosis present

## 2024-05-19 DIAGNOSIS — I10 Essential (primary) hypertension: Secondary | ICD-10-CM | POA: Diagnosis present

## 2024-05-19 DIAGNOSIS — G928 Other toxic encephalopathy: Secondary | ICD-10-CM | POA: Diagnosis present

## 2024-05-19 DIAGNOSIS — F1410 Cocaine abuse, uncomplicated: Secondary | ICD-10-CM | POA: Diagnosis present

## 2024-05-19 DIAGNOSIS — K219 Gastro-esophageal reflux disease without esophagitis: Secondary | ICD-10-CM | POA: Diagnosis present

## 2024-05-19 DIAGNOSIS — F1910 Other psychoactive substance abuse, uncomplicated: Secondary | ICD-10-CM | POA: Diagnosis present

## 2024-05-19 DIAGNOSIS — F17210 Nicotine dependence, cigarettes, uncomplicated: Secondary | ICD-10-CM | POA: Diagnosis present

## 2024-05-19 DIAGNOSIS — Z8711 Personal history of peptic ulcer disease: Secondary | ICD-10-CM | POA: Diagnosis not present

## 2024-05-19 DIAGNOSIS — Z79899 Other long term (current) drug therapy: Secondary | ICD-10-CM | POA: Diagnosis not present

## 2024-05-19 DIAGNOSIS — G4710 Hypersomnia, unspecified: Secondary | ICD-10-CM | POA: Diagnosis present

## 2024-05-19 DIAGNOSIS — R112 Nausea with vomiting, unspecified: Secondary | ICD-10-CM | POA: Diagnosis present

## 2024-05-19 DIAGNOSIS — Z8249 Family history of ischemic heart disease and other diseases of the circulatory system: Secondary | ICD-10-CM | POA: Diagnosis not present

## 2024-05-19 DIAGNOSIS — I160 Hypertensive urgency: Secondary | ICD-10-CM | POA: Diagnosis present

## 2024-05-19 DIAGNOSIS — Z7982 Long term (current) use of aspirin: Secondary | ICD-10-CM | POA: Diagnosis not present

## 2024-05-19 DIAGNOSIS — I674 Hypertensive encephalopathy: Secondary | ICD-10-CM | POA: Diagnosis present

## 2024-05-19 DIAGNOSIS — E860 Dehydration: Secondary | ICD-10-CM | POA: Diagnosis present

## 2024-05-19 LAB — URINALYSIS, ROUTINE W REFLEX MICROSCOPIC
Bilirubin Urine: NEGATIVE
Glucose, UA: 50 mg/dL — AB
Hgb urine dipstick: NEGATIVE
Ketones, ur: 5 mg/dL — AB
Leukocytes,Ua: NEGATIVE
Nitrite: NEGATIVE
Protein, ur: >=300 mg/dL — AB
Specific Gravity, Urine: 1.024 (ref 1.005–1.030)
pH: 5 (ref 5.0–8.0)

## 2024-05-19 LAB — URINE DRUG SCREEN, QUALITATIVE (ARMC ONLY)
Amphetamines, Ur Screen: NOT DETECTED
Barbiturates, Ur Screen: NOT DETECTED
Benzodiazepine, Ur Scrn: NOT DETECTED
Cannabinoid 50 Ng, Ur ~~LOC~~: POSITIVE — AB
Cocaine Metabolite,Ur ~~LOC~~: POSITIVE — AB
MDMA (Ecstasy)Ur Screen: NOT DETECTED
Methadone Scn, Ur: NOT DETECTED
Opiate, Ur Screen: NOT DETECTED
Phencyclidine (PCP) Ur S: NOT DETECTED
Tricyclic, Ur Screen: NOT DETECTED

## 2024-05-19 LAB — BLOOD GAS, VENOUS: Patient temperature: 37

## 2024-05-19 MED ORDER — HYDROCHLOROTHIAZIDE 25 MG PO TABS
25.0000 mg | ORAL_TABLET | Freq: Every day | ORAL | Status: DC
  Administered 2024-05-19 – 2024-05-22 (×6): 25 mg via ORAL
  Filled 2024-05-19 (×4): qty 1

## 2024-05-19 MED ORDER — LACTATED RINGERS IV SOLN: INTRAVENOUS | Status: AC

## 2024-05-19 MED ORDER — HYDRALAZINE HCL 20 MG/ML IJ SOLN
10.0000 mg | Freq: Four times a day (QID) | INTRAMUSCULAR | Status: DC | PRN
  Administered 2024-05-21: 10 mg via INTRAVENOUS
  Filled 2024-05-19 (×2): qty 1

## 2024-05-19 MED ORDER — ALUM & MAG HYDROXIDE-SIMETH 200-200-20 MG/5ML PO SUSP
15.0000 mL | Freq: Four times a day (QID) | ORAL | Status: DC | PRN
  Administered 2024-05-19 – 2024-05-24 (×11): 15 mL via ORAL
  Filled 2024-05-19 (×9): qty 30

## 2024-05-19 NOTE — Progress Notes (Signed)
 Patient arrived to unit, oriented to room and unit with reported understanding.  Patient alert and oriented x 4, denies pain.  Skin assessment complete with skin intact.  Ambition profile complete with password set-up to ensure patient privacy.

## 2024-05-19 NOTE — ED Notes (Signed)
 Messaged admitting MD regarding pt's BP.

## 2024-05-19 NOTE — Progress Notes (Addendum)
 PROGRESS NOTE    Robin Mckinney  FMW:[id number] DOB: 07/26/1961 DOA: 05/18/2024 PCP: [facility]  Chief Complaint  Patient presents with   Nausea    Hospital Course:  Robin Mckinney is a 63 y.o. female with medical history significant of hypertension, CAD, GERD, PUD, and polysubstance abuse presented to the ED with nausea and vomiting.  Per chart review, had nausea/vomiting, denies abd pain. On presentation, significantly hypertensive and states she has not had her blood pressure medication for the past 24 hours after she ran out, admits using crack cocaine and was somnolent on presentation  Subjective: Patient was examined at the bedside, mildly lethargic, slowly improving. States has nausea, poor po intake, abdominal pain.  Lives with sister and states not to disclose any information to any family member   Objective: Vitals:   05/18/24 2311 05/19/24 0130 05/19/24 0720 05/19/24 0750  BP: 132/81 (!) 160/100 (!) 187/120 (!) 183/118  Pulse: 90 97 89 87  Resp: (!) 23 17 (!) 23 (!) 22  Temp: 98.3 F (36.8 C)  (!) 97.2 F (36.2 C)   TempSrc:   Oral   SpO2: 100% 98% 95% 99%  Weight:      Height:       No intake or output data in the 24 hours ending 05/19/24 0839 Filed Weights   05/18/24 1336  Weight: 65.8 kg    Examination: Constitutional:      General: She is not in acute distress.    Appearance: She is not toxic-appearing.     Comments: Lethargic, but Aox 3, improving Cardiovascular:     Rate and Rhythm: Normal rate and regular rhythm.  Pulmonary:     Effort: Pulmonary effort is normal. No respiratory distress.     Breath sounds: Normal breath sounds. No wheezing.  Abdominal:     General: Bowel sounds are normal. There is no distension.     Palpations: Abdomen is soft.     Tenderness: There is no abdominal tenderness.  Musculoskeletal:     Cervical back: Neck supple.     Right lower leg: No edema.     Left lower leg: No edema.   Skin:    General: Skin is warm and dry.     Coloration: Skin is not jaundiced.     Findings: No bruising.  Neurological:     Mental Status: She is lethargic, no gross focal deficits  Assessment & Plan:  Principal Problem:   Polysubstance abuse (HCC) Active Problems:   Benign essential HTN   Coronary artery calcification   Hypertensive encephalopathy syndrome  63 y.o. female with medical history significant of hypertension, CAD, GERD, PUD, and polysubstance abuse who presents to the ED complaining of nausea and vomiting.  Hypertensive and hypersomnolent on arrival.  No vomiting noted.  Workup so far including CT head unremarkable.   AMS/hypersomnolence  - suspect related to substance abuse, either opioid intoxication or cocaine use vs hypertensive encephalopathy. No suspicion of infectious etiology - pCO2 not elevated, TSH wnl - CT head unremarkable.  MRI Brain negative - s/p Trial of Narcan  - Mildly lethargic, improving. Feels sore, nausea, abd pain - Continue IV fluids, monitor - UA, UDS pending  Hypertension, uncontrolled  Hypertensive urgency - Insetting of cocaine use and running out of home antihypertensives.  Otherwise no evidence of endorgan damage - s/p Nicardipine  drip - Takes Lisinopril  20mg  at home, uncontrolled at home as per patient - > 24 hrs since presentation, will add hydrochlorothiazide  25  as BP uncontrolled and patient will benefit from combination, Hydralazine  prn with holding parameters - If BP controlled, plan to discharge on Lisinopril  20-hydrochlorothiazide  25 daily - Monitor and titrate antihypertensives as needed - will need close PCP f/u    CAD  - resume asa, statin, ACE-I   Substance use disorder Crack cocaine use, ? Opioid use UDS pending  GERD - PPI, Add Maalox  DVT prophylaxis: Lovenox  SQ   Code Status: Full Code Disposition:  Home  Consultants:  None  Procedures:  None  Antimicrobials:  Anti-infectives (From admission, onward)     None       Data Reviewed: I have personally reviewed following labs and imaging studies CBC: Recent Labs  Lab 05/18/24 1343  WBC 6.0  HGB 13.6  HCT 44.7  MCV 94.3  PLT 277   Basic Metabolic Panel: Recent Labs  Lab 05/18/24 1343  NA 137  K 3.7  CL 99  CO2 24  GLUCOSE 101*  BUN 17  CREATININE 0.83  CALCIUM  9.5   GFR: Estimated Creatinine Clearance: 63.3 mL/min (by C-G formula based on SCr of 0.83 mg/dL). Liver Function Tests: Recent Labs  Lab 05/18/24 1343  AST 32  ALT 16  ALKPHOS 82  BILITOT 0.6  PROT 8.5*  ALBUMIN 4.5   CBG: No results for input(s): GLUCAP in the last 168 hours.  No results found for this or any previous visit (from the past 240 hours).   Radiology Studies: MR BRAIN WO CONTRAST Result Date: 05/18/2024 CLINICAL DATA:  Initial evaluation for acute mental status change. EXAM: MRI HEAD WITHOUT CONTRAST TECHNIQUE: Multiplanar, multiecho pulse sequences of the brain and surrounding structures were obtained without intravenous contrast. COMPARISON:  CT from earlier the same day. FINDINGS: Brain: Examination moderately degraded by motion artifact. Cerebral volume within normal limits. Patchy T2/FLAIR hyperintensity involving the periventricular deep white matter, consistent with chronic small vessel ischemic disease, mild for age. No evidence for acute or subacute infarct. No areas of chronic cortical infarction. No acute or chronic intracranial blood products. No mass lesion, midline shift or mass effect no hydrocephalus or extra-axial fluid collection. Pituitary gland within normal limits. Vascular: Major intracranial vascular flow voids are maintained. Skull and upper cervical spine: Craniocervical junction within normal limits. Bone marrow signal intensity overall within normal limits. No scalp soft tissue abnormality. Sinuses/Orbits: Globes orbital soft tissues within normal limits. Paranasal sinuses are largely clear. No significant mastoid  effusion. Other: None. IMPRESSION: 1. No acute intracranial abnormality. 2. Mild chronic microvascular ischemic disease for age. Electronically Signed   By: Morene Hoard M.D.   On: 05/18/2024 20:16   CT Head Wo Contrast Result Date: 05/18/2024 CLINICAL DATA:  Hypertensive. EXAM: CT HEAD WITHOUT CONTRAST TECHNIQUE: Contiguous axial images were obtained from the base of the skull through the vertex without intravenous contrast. RADIATION DOSE REDUCTION: This exam was performed according to the departmental dose-optimization program which includes automated exposure control, adjustment of the mA and/or kV according to patient size and/or use of iterative reconstruction technique. COMPARISON:  November 08, 2023 FINDINGS: Brain: No evidence of acute infarction, hemorrhage, hydrocephalus, extra-axial collection or mass lesion/mass effect. Vascular: No hyperdense vessel or unexpected calcification. Skull: Normal. Negative for fracture or focal lesion. Sinuses/Orbits: Mild posterior left ethmoid sinus mucosal thickening is seen. Other: None. IMPRESSION: No acute intracranial abnormality. Electronically Signed   By: Suzen Dials M.D.   On: 05/18/2024 17:52    Scheduled Meds:  aspirin  EC  81 mg Oral Daily   enoxaparin  (  LOVENOX ) injection  40 mg Subcutaneous Q24H   lisinopril   20 mg Oral Daily   nicotine   21 mg Transdermal Daily   pantoprazole   40 mg Oral Daily   polyethylene glycol  17 g Oral Daily   Continuous Infusions:  niCARDipine  Stopped (05/18/24 2313)     LOS: 0 days  MDM: Patient is high risk for one or more organ failure.  They necessitate ongoing hospitalization for continued IV therapies and subsequent lab monitoring. Total time spent interpreting labs and vitals, reviewing the medical record, coordinating care amongst consultants and care team members, directly assessing and discussing care with the patient and/or family: 55 min  Laree Lock, MD [facility]  To contact  the attending physician between 7A-7P please use Epic Chat. To contact the covering physician during after hours 7P-7A, please review Amion.  05/19/2024, 8:39 AM   *This document has been created with the assistance of dictation software. Please excuse typographical errors. *

## 2024-05-19 NOTE — ED Notes (Signed)
 Fall risk bundle in place.

## 2024-05-19 NOTE — ED Notes (Signed)
 Pt assisted to bathroom. Pt returned to bed with new brief and repositioned. Call light within reach.

## 2024-05-20 DIAGNOSIS — F1910 Other psychoactive substance abuse, uncomplicated: Secondary | ICD-10-CM | POA: Diagnosis not present

## 2024-05-20 LAB — CBC
HCT: 47.4 % — ABNORMAL HIGH (ref 36.0–46.0)
Hemoglobin: 15.9 g/dL — ABNORMAL HIGH (ref 12.0–15.0)
MCH: 28.9 pg (ref 26.0–34.0)
MCHC: 33.5 g/dL (ref 30.0–36.0)
MCV: 86 fL (ref 80.0–100.0)
Platelets: 296 K/uL (ref 150–400)
RBC: 5.51 MIL/uL — ABNORMAL HIGH (ref 3.87–5.11)
RDW: 13.9 % (ref 11.5–15.5)
WBC: 13.9 K/uL — ABNORMAL HIGH (ref 4.0–10.5)
nRBC: 0 % (ref 0.0–0.2)

## 2024-05-20 LAB — BASIC METABOLIC PANEL WITH GFR
Anion gap: 12 (ref 5–15)
BUN: 36 mg/dL — ABNORMAL HIGH (ref 8–23)
CO2: 25 mmol/L (ref 22–32)
Calcium: 9.5 mg/dL (ref 8.9–10.3)
Chloride: 97 mmol/L — ABNORMAL LOW (ref 98–111)
Creatinine, Ser: 0.95 mg/dL (ref 0.44–1.00)
GFR, Estimated: >60 mL/min (ref >60)
Glucose, Bld: 129 mg/dL — ABNORMAL HIGH (ref 70–99)
Potassium: 3.5 mmol/L (ref 3.5–5.1)
Sodium: 134 mmol/L — ABNORMAL LOW (ref 135–145)

## 2024-05-20 MED ORDER — ENSURE PLUS HIGH PROTEIN PO LIQD
237.0000 mL | Freq: Two times a day (BID) | ORAL | Status: DC
  Administered 2024-05-20 – 2024-05-24 (×8): 237 mL via ORAL

## 2024-05-20 MED ORDER — ADULT MULTIVITAMIN W/MINERALS CH
1.0000 | ORAL_TABLET | Freq: Every day | ORAL | Status: DC
  Administered 2024-05-20 – 2024-05-24 (×6): 1 via ORAL
  Filled 2024-05-20 (×5): qty 1

## 2024-05-20 MED ORDER — CHLORPROMAZINE HCL 25 MG PO TABS
25.0000 mg | ORAL_TABLET | Freq: Once | ORAL | Status: AC
  Administered 2024-05-20 (×2): 25 mg via ORAL
  Filled 2024-05-20: qty 1

## 2024-05-20 MED ORDER — CHLORPROMAZINE HCL 25 MG PO TABS
25.0000 mg | ORAL_TABLET | Freq: Three times a day (TID) | ORAL | Status: DC | PRN
  Administered 2024-05-21: 25 mg via ORAL
  Filled 2024-05-20 (×2): qty 1

## 2024-05-20 NOTE — Progress Notes (Signed)
 Patient complained of chest pain or reflux. Vital signs checked, maalox given, and EKG done.  Dr Cleatus notified.

## 2024-05-20 NOTE — Plan of Care (Signed)
  Problem: Education: Goal: Knowledge of General Education information will improve Description: Including pain rating scale, medication(s)/side effects and non-pharmacologic comfort measures Outcome: Progressing   Problem: Clinical Measurements: Goal: Will remain free from infection Outcome: Progressing Goal: Cardiovascular complication will be avoided Outcome: Progressing   Problem: Skin Integrity: Goal: Risk for impaired skin integrity will decrease Outcome: Progressing

## 2024-05-20 NOTE — Progress Notes (Signed)
 Initial Nutrition Assessment  DOCUMENTATION CODES:   Not applicable  INTERVENTION:   -Liberalize diet to regular for widest variety of meal selections -MVI with minerals daily -Ensure Plus High Protein po BID, each supplement provides 350 kcal and 20 grams of protein   NUTRITION DIAGNOSIS:   Increased nutrient needs related to acute illness as evidenced by estimated needs.  GOAL:   Patient will meet greater than or equal to 90% of their needs  MONITOR:   PO intake, Supplement acceptance  REASON FOR ASSESSMENT:   Malnutrition Screening Tool    ASSESSMENT:   Pt with medical history significant of hypertension, CAD, GERD, PUD, and polysubstance abuse who presents with complaints of of nausea and vomiting; pt was hypertensive and hypersomnolent on arrival.  No vomiting noted.  Workup so far including CT head unremarkable.  Pt admitted with AMS/ hypersomnolence and hypertensive urgency.   Reviewed I/O's: +240 ml x 24 hours  Pt sleeping soundly at time of visit. She did not arouse to voice or touch. No family present at time of visit. Unable to obtain additional history at this time.   Noted Ensure at bedside, which was untouched. Pt consumed about 75% of a cup of orange juice.   Reviewed wt hx; pt has experienced a 5% wt loss over the past 6 months, which is not significant for time frame.   Medications reviewed and include lovenox , protonix , miralax , and lactated ringers  infusion @ 50 ml/hr.  Labs reviewed: Na: 134, CBGS: 128 (inpatient orders for glycemic control are none). Tox screen psoitive for cocaine and cannabinoids.   NUTRITION - FOCUSED PHYSICAL EXAM:  Flowsheet Row Most Recent Value  Orbital Region No depletion  Upper Arm Region Mild depletion  Thoracic and Lumbar Region No depletion  Buccal Region No depletion  Temple Region Mild depletion  Clavicle Bone Region No depletion  Clavicle and Acromion Bone Region No depletion  Scapular Bone Region No  depletion  Dorsal Hand No depletion  Patellar Region No depletion  Anterior Thigh Region No depletion  Posterior Calf Region No depletion  Edema (RD Assessment) None  Hair Reviewed  Eyes Reviewed  Mouth Reviewed  Skin Reviewed  Nails Reviewed    Diet Order:   Diet Order             Diet regular Fluid consistency: Thin  Diet effective now                   EDUCATION NEEDS:   No education needs have been identified at this time  Skin:  Skin Assessment: Reviewed RN Assessment  Last BM:  05/18/24  Height:   Ht Readings from Last 1 Encounters:  05/18/24 5' 3 (1.6 m)    Weight:   Wt Readings from Last 1 Encounters:  05/20/24 62.5 kg    Ideal Body Weight:  52.3 kg  BMI:  Body mass index is 24.41 kg/m.  Estimated Nutritional Needs:   Kcal:  1700-1900  Protein:  85-100 grams  Fluid:  1.7-1.9 L    Margery ORN, RD, LDN, CDCES Registered Dietitian III Certified Diabetes Care and Education Specialist If unable to reach this RD, please use RD Inpatient group chat on secure chat between hours of 8am-4 pm daily

## 2024-05-20 NOTE — Progress Notes (Signed)
 Progress Note   Patient: Robin Mckinney FMW:[id number] DOB: [birth year] DOA: 05/18/2024     1 DOS: the patient was seen and examined on 05/20/2024   Brief hospital course: From HPI Robin Mckinney is a 63 y.o. female with medical history significant of hypertension, CAD, GERD, PUD, and polysubstance abuse who presents to the ED complaining of nausea and vomiting.  Patient was sleeping but arousable, not willing to cooperate with me for interview.  History as per ED documentation Per EMS, patient called out from work with nausea and vomiting. Patient states that she feels nauseous but denies any abdominal pain or diarrhea, states that symptoms started today while she was at work. She was found to be significantly hypertensive and states she has not had her blood pressure medication for the past 24 hours after she ran out. She denies any chest pain, shortness of breath, headache, numbness, or weakness. She does admit to crack cocaine use earlier this morning before she went to work.   In the emergency department, she was hypertensive, somnolent but arousable, with nonfocal neurologic exam. No vomiting since arrival. Persistently hypertensive despite IV labetalol .  CT head without acute intracranial abnormality.  Urine has not been collected but on my exam pupils were pinpoint.  She was started on a nicardipine  drip for possible hypertensive encephalopathy.    Will give a dose of narcan  and admit to the hospitalist service for further evaluation/management of AMS   Assessment and Plan:   Acute toxic and metabolic encephalopathy - suspect related to substance abuse, either opioid intoxication or cocaine withdrawal. Admitted to cocaine use in triage.  CT head unremarkable.   Patient able to engage in some conversation Patient received trial of Narcan  U tox was positive for cocaine as well as cannabis Continue to monitor closely Counseled on concerning avoidance of illicit drugs    Hypertension, uncontrolled  Blood pressure improving Continue current antihypertensives   CAD  Continue asa, statin, ACE-I     DVT prophylaxis: Lovenox       Advance Care Planning:   Status.  Full code    Subjective:  Patient seen and examined at bedside this morning Appears lethargic however awake Denies worsening shortness of breath chest pain or cough  Physical Exam:  General: Elderly female laying in bed appears older than age in no acute distress HENT: Conjunctiva injected Cardiovascular:     Rate and Rhythm: Normal rate and regular rhythm.  Pulmonary:     Effort: Pulmonary effort is normal. No respiratory distress.     Breath sounds: Normal breath sounds. No wheezing.  Abdominal:     General: Bowel sounds are normal.  Musculoskeletal: No pain or extremities Neurological: Lethargic and awake     Vitals:   05/20/24 1000 05/20/24 1027 05/20/24 1220 05/20/24 1616  BP: (!) 140/104  (!) 148/90 (!) 162/98  Pulse:   87 95  Resp:   18 18  Temp:   98.6 F (37 C) 99.4 F (37.4 C)  TempSrc:      SpO2:   98% 98%  Weight:  62.5 kg    Height:        Data Reviewed:    Latest Ref Rng & Units 05/20/2024    5:03 AM 05/18/2024    1:43 PM 11/09/2023    4:28 AM  CBC  WBC 4.0 - 10.5 K/uL 13.9  6.0  6.3   Hemoglobin 12.0 - 15.0 g/dL 84.0  86.3  88.5   Hematocrit 36.0 - 46.0 %  47.4  44.7  35.1   Platelets 150 - 400 K/uL 296  277  249        Latest Ref Rng & Units 05/20/2024    5:03 AM 05/18/2024    1:43 PM 11/10/2023    5:12 AM  BMP  Glucose 70 - 99 mg/dL 870  898  92   BUN 8 - 23 mg/dL 36  17  31   Creatinine 0.44 - 1.00 mg/dL 9.04  9.16  8.99   Sodium 135 - 145 mmol/L 134  137  138   Potassium 3.5 - 5.1 mmol/L 3.5  3.7  3.9   Chloride 98 - 111 mmol/L 97  99  107   CO2 22 - 32 mmol/L 25  24  24    Calcium  8.9 - 10.3 mg/dL 9.5  9.5  8.0   I have reviewed patient's MRI as well as CT scan of the brain that did not show any acute intracranial pathology  Family  Communication: None present at bedside    Time spent: 56 minutes  Author: Drue ONEIDA Potter, MD 05/20/2024 4:41 PM  For on call review [email or web address].

## 2024-05-21 DIAGNOSIS — F1910 Other psychoactive substance abuse, uncomplicated: Secondary | ICD-10-CM | POA: Diagnosis not present

## 2024-05-21 LAB — CBC WITH DIFFERENTIAL/PLATELET
Abs Immature Granulocytes: 0.07 K/uL (ref 0.00–0.07)
Basophils Absolute: 0 K/uL (ref 0.0–0.1)
Basophils Relative: 0 %
Eosinophils Absolute: 0 K/uL (ref 0.0–0.5)
Eosinophils Relative: 0 %
HCT: 46.2 % — ABNORMAL HIGH (ref 36.0–46.0)
Hemoglobin: 15.6 g/dL — ABNORMAL HIGH (ref 12.0–15.0)
Immature Granulocytes: 1 %
Lymphocytes Relative: 9 %
Lymphs Abs: 1.1 K/uL (ref 0.7–4.0)
MCH: 29.3 pg (ref 26.0–34.0)
MCHC: 33.8 g/dL (ref 30.0–36.0)
MCV: 86.7 fL (ref 80.0–100.0)
Monocytes Absolute: 0.8 K/uL (ref 0.1–1.0)
Monocytes Relative: 7 %
Neutro Abs: 9.6 K/uL — ABNORMAL HIGH (ref 1.7–7.7)
Neutrophils Relative %: 83 %
Platelets: 278 K/uL (ref 150–400)
RBC: 5.33 MIL/uL — ABNORMAL HIGH (ref 3.87–5.11)
RDW: 13.9 % (ref 11.5–15.5)
WBC: 11.5 K/uL — ABNORMAL HIGH (ref 4.0–10.5)
nRBC: 0 % (ref 0.0–0.2)

## 2024-05-21 LAB — BASIC METABOLIC PANEL WITH GFR
Anion gap: 13 (ref 5–15)
BUN: 41 mg/dL — ABNORMAL HIGH (ref 8–23)
CO2: 24 mmol/L (ref 22–32)
Calcium: 9.8 mg/dL (ref 8.9–10.3)
Chloride: 97 mmol/L — ABNORMAL LOW (ref 98–111)
Creatinine, Ser: 1.03 mg/dL — ABNORMAL HIGH (ref 0.44–1.00)
GFR, Estimated: >60 mL/min (ref >60)
Glucose, Bld: 111 mg/dL — ABNORMAL HIGH (ref 70–99)
Potassium: 3.8 mmol/L (ref 3.5–5.1)
Sodium: 134 mmol/L — ABNORMAL LOW (ref 135–145)

## 2024-05-21 MED ORDER — LISINOPRIL 20 MG PO TABS: 40.0000 mg | ORAL_TABLET | Freq: Every day | ORAL | Status: DC

## 2024-05-21 NOTE — Progress Notes (Signed)
 Progress Note   Patient: Robin Mckinney FMW:[id number] DOB: May 30, 1961 DOA: 05/18/2024     2 DOS: the patient was seen and examined on 05/21/2024    Brief hospital course: From HPI Robin Mckinney is a 63 y.o. female with medical history significant of hypertension, CAD, GERD, PUD, and polysubstance abuse who presents to the ED complaining of nausea and vomiting.  Patient was sleeping but arousable, not willing to cooperate with me for interview.  History as per ED documentation Per EMS, patient called out from work with nausea and vomiting. Patient states that she feels nauseous but denies any abdominal pain or diarrhea, states that symptoms started today while she was at work. She was found to be significantly hypertensive and states she has not had her blood pressure medication for the past 24 hours after she ran out. She denies any chest pain, shortness of breath, headache, numbness, or weakness. She does admit to crack cocaine use earlier this morning before she went to work.   In the emergency department, she was hypertensive, somnolent but arousable, with nonfocal neurologic exam. No vomiting since arrival. Persistently hypertensive despite IV labetalol .  CT head without acute intracranial abnormality.  Urine has not been collected but on my exam pupils were pinpoint.  She was started on a nicardipine  drip for possible hypertensive encephalopathy.    Will give a dose of narcan  and admit to the hospitalist service for further evaluation/management of AMS    Assessment and Plan:    Acute toxic and metabolic encephalopathy - suspect related to substance abuse, either opioid intoxication or cocaine withdrawal. Admitted to cocaine use in triage.  CT head unremarkable.   Patient able to engage in some conversation Patient received trial of Narcan  U tox was positive for cocaine as well as cannabis Continue to monitor closely Counseled on concerning avoidance of illicit drugs    Hypertension, uncontrolled  Blood pressure improving Continue current antihypertensives Lisinopril  dose increased from 20 to 40 mg daily   CAD  Continue asa, statin, ACE-I     DVT prophylaxis: Lovenox          Advance Care Planning:   Status.  Full code       Subjective:  Patient seen and examined at bedside this morning Patient appears lethargic Denies chest pain nausea vomiting Blood pressure elevated but improving  Physical Exam:   General: Elderly female laying in bed appears older than age in no acute distress HENT: Conjunctiva injected Cardiovascular:     Rate and Rhythm: Normal rate and regular rhythm.  Pulmonary:     Effort: Pulmonary effort is normal. No respiratory distress.     Breath sounds: Normal breath sounds. No wheezing.  Abdominal:     General: Bowel sounds are normal.  Musculoskeletal: No pain or extremities Neurological: Lethargic and awake       Vitals:   05/20/24 2354 05/21/24 0446 05/21/24 0916 05/21/24 1601  BP: (!) 174/126 (!) 158/111 (!) 180/118 118/68  Pulse: 99 (!) 102 96   Resp:  16 18 18   Temp: 98.2 F (36.8 C) 98.9 F (37.2 C) 98.9 F (37.2 C) 98.5 F (36.9 C)  TempSrc:      SpO2: 100% 100% 99% 99%  Weight:      Height:          Latest Ref Rng & Units 05/21/2024    4:13 AM 05/20/2024    5:03 AM 05/18/2024    1:43 PM  CBC  WBC 4.0 - 10.5 K/uL  11.5  13.9  6.0   Hemoglobin 12.0 - 15.0 g/dL 84.3  84.0  86.3   Hematocrit 36.0 - 46.0 % 46.2  47.4  44.7   Platelets 150 - 400 K/uL 278  296  277    .bmop  Author: Drue ONEIDA Potter, MD 05/21/2024 6:03 PM  For on call review [email or web address].

## 2024-05-21 NOTE — Plan of Care (Signed)
 Patient remains free from any noted signs of acute distress.  No additional medical interventions required.  Patient to continue to be monitored until discharged.

## 2024-05-21 NOTE — Plan of Care (Signed)

## 2024-05-22 DIAGNOSIS — F1910 Other psychoactive substance abuse, uncomplicated: Secondary | ICD-10-CM | POA: Diagnosis not present

## 2024-05-22 LAB — BASIC METABOLIC PANEL WITH GFR
Anion gap: 15 (ref 5–15)
BUN: 63 mg/dL — ABNORMAL HIGH (ref 8–23)
CO2: 22 mmol/L (ref 22–32)
Calcium: 9.5 mg/dL (ref 8.9–10.3)
Chloride: 97 mmol/L — ABNORMAL LOW (ref 98–111)
Creatinine, Ser: 1.72 mg/dL — ABNORMAL HIGH (ref 0.44–1.00)
GFR, Estimated: 33 mL/min — ABNORMAL LOW (ref >60)
Glucose, Bld: 100 mg/dL — ABNORMAL HIGH (ref 70–99)
Potassium: 3.4 mmol/L — ABNORMAL LOW (ref 3.5–5.1)
Sodium: 134 mmol/L — ABNORMAL LOW (ref 135–145)

## 2024-05-22 LAB — CBC WITH DIFFERENTIAL/PLATELET
Abs Immature Granulocytes: 0.03 K/uL (ref 0.00–0.07)
Basophils Absolute: 0 K/uL (ref 0.0–0.1)
Basophils Relative: 0 %
Eosinophils Absolute: 0 K/uL (ref 0.0–0.5)
Eosinophils Relative: 0 %
HCT: 45 % (ref 36.0–46.0)
Hemoglobin: 14.8 g/dL (ref 12.0–15.0)
Immature Granulocytes: 0 %
Lymphocytes Relative: 8 %
Lymphs Abs: 0.8 K/uL (ref 0.7–4.0)
MCH: 28.6 pg (ref 26.0–34.0)
MCHC: 32.9 g/dL (ref 30.0–36.0)
MCV: 86.9 fL (ref 80.0–100.0)
Monocytes Absolute: 1 K/uL (ref 0.1–1.0)
Monocytes Relative: 11 %
Neutro Abs: 8 K/uL — ABNORMAL HIGH (ref 1.7–7.7)
Neutrophils Relative %: 81 %
Platelets: 273 K/uL (ref 150–400)
RBC: 5.18 MIL/uL — ABNORMAL HIGH (ref 3.87–5.11)
RDW: 13.8 % (ref 11.5–15.5)
WBC: 9.9 K/uL (ref 4.0–10.5)
nRBC: 0 % (ref 0.0–0.2)

## 2024-05-22 MED ORDER — SODIUM CHLORIDE 0.9 % IV BOLUS
1000.0000 mL | Freq: Once | INTRAVENOUS | Status: AC
  Administered 2024-05-22: 1000 mL via INTRAVENOUS

## 2024-05-22 NOTE — Progress Notes (Signed)
 Progress Note   Patient: Robin Mckinney FMW:[id number] DOB: 06-02-1961 DOA: 05/18/2024     3 DOS: the patient was seen and examined on 05/22/2024      Brief hospital course: From HPI RAYA MCKINSTRY is a 63 y.o. female with medical history significant of hypertension, CAD, GERD, PUD, and polysubstance abuse who presents to the ED complaining of nausea and vomiting.  Patient was sleeping but arousable, not willing to cooperate with me for interview.  History as per ED documentation Per EMS, patient called out from work with nausea and vomiting. Patient states that she feels nauseous but denies any abdominal pain or diarrhea, states that symptoms started today while she was at work. She was found to be significantly hypertensive and states she has not had her blood pressure medication for the past 24 hours after she ran out. She denies any chest pain, shortness of breath, headache, numbness, or weakness. She does admit to crack cocaine use earlier this morning before she went to work.   In the emergency department, she was hypertensive, somnolent but arousable, with nonfocal neurologic exam. No vomiting since arrival. Persistently hypertensive despite IV labetalol .  CT head without acute intracranial abnormality.  Urine has not been collected but on my exam pupils were pinpoint.  She was started on a nicardipine  drip for possible hypertensive encephalopathy.    Will give a dose of narcan  and admit to the hospitalist service for further evaluation/management of AMS    Assessment and Plan:    Acute toxic and metabolic encephalopathy - suspect related to substance abuse, either opioid intoxication or cocaine withdrawal. Admitted to cocaine use in triage.  CT head unremarkable.   Patient able to engage in some conversation Patient received trial of Narcan  U tox was positive for cocaine as well as cannabis Continue to monitor closely Counseled on concerning avoidance of illicit drugs    Hypertension, uncontrolled  Blood pressure improving Holding ACE inhibitor and hydrochlorothiazide  Lisinopril  dose increased from 20 to 40 mg daily   CAD  Continue asa, statin Holding ACE inhibitor and hydrochlorothiazide    DVT prophylaxis: Lovenox     Acute kidney injury secondary to nephrotoxic meds We will hold on lisinopril  and hydrochlorothiazide  We will give IV fluid    Advance Care Planning:   Status.  Full code       Subjective:  Patient seen and examined at bedside this morning Patient appears lethargic Denies chest pain nausea vomiting Blood pressure elevated but improving   Physical Exam:   General: Elderly female laying in bed appears older than age in no acute distress HENT: Conjunctiva injected Cardiovascular:     Rate and Rhythm: Normal rate and regular rhythm.  Pulmonary:     Effort: Pulmonary effort is normal. No respiratory distress.     Breath sounds: Normal breath sounds. No wheezing.  Abdominal:     General: Bowel sounds are normal.  Musculoskeletal: No pain or extremities Neurological: Lethargic and awake          Vitals:   05/21/24 1601 05/21/24 2002 05/22/24 0500 05/22/24 0743  BP: 118/68 (!) 130/92 (!) 122/92 120/81  Pulse:  99 75 96  Resp: 18 16 16 16   Temp: 98.5 F (36.9 C) 99.4 F (37.4 C) 98.7 F (37.1 C) 98.7 F (37.1 C)  TempSrc:   Oral   SpO2: 99% 100% 100% 100%  Weight:      Height:        Data Reviewed:    Latest Ref Rng &  Units 05/22/2024    5:12 AM 05/21/2024    4:13 AM 05/20/2024    5:03 AM  CBC  WBC 4.0 - 10.5 K/uL 9.9  11.5  13.9   Hemoglobin 12.0 - 15.0 g/dL 85.1  84.3  84.0   Hematocrit 36.0 - 46.0 % 45.0  46.2  47.4   Platelets 150 - 400 K/uL 273  278  296      Author: Drue ONEIDA Potter, MD 05/22/2024 5:11 PM  For on call review [email or web address].

## 2024-05-22 NOTE — Discharge Instructions (Signed)

## 2024-05-22 NOTE — TOC Initial Note (Signed)
 Transition of Care [facility]) - Initial/Assessment Note    Patient Details  Name: Robin Mckinney MRN: [id number] Date of Birth: Feb 09, 1961  [facility]) CM/SW Contact:    Dalia GORMAN Fuse, RN Phone Number: 05/22/2024, 9:52 AM  Clinical Narrative:                 Patient is from home and admitted with Acute toxic and metabolic encephalopathy, urine positive for cocaine and canabis. TOC placed list of substance abuse resources on the patient's AVS. No other TOC needs, please outreach if needs identified.         Patient Goals and CMS Choice            Expected Discharge Plan and Services                                              Prior Living Arrangements/Services                       Activities of Daily Living   ADL Screening (condition at time of admission) Is the patient deaf or have difficulty hearing?: No Does the patient have difficulty seeing, even when wearing glasses/contacts?: No Does the patient have difficulty concentrating, remembering, or making decisions?: No  Permission Sought/Granted                  Emotional Assessment              Admission diagnosis:  Hypertensive encephalopathy syndrome [I67.4] Hypertensive urgency [I16.0] Uncontrolled hypertension [I10] Nausea and vomiting, unspecified vomiting type [R11.2] Patient Active Problem List   Diagnosis Date Noted   Hypertensive encephalopathy syndrome 05/18/2024   AKI (acute kidney injury) (HCC) 11/08/2023   Fall at home, initial encounter 11/08/2023   UTI (urinary tract infection) 11/08/2023   Enterocolitis 03/22/2023   Gastroduodenitis without bleeding 03/22/2023   Hypokalemia 03/22/2023   Hypertensive urgency 03/22/2023   Polysubstance abuse (HCC) 03/22/2023   Pain, arm, left 09/17/2018   Numbness and tingling of left hand 10/18/2017   Smoker 10/18/2017   Coronary artery calcification 10/18/2017   Chest pain 10/17/2017   Benign essential HTN  10/17/2017   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 [address] Phone: [phone] Fax: [phone]  [facility] - [place], [place] - [address] [place] [address] Phone: [phone] Fax: [phone]     Social Drivers of Health (SDOH) Social History: SDOH Screenings   Food Insecurity: No Food Insecurity (05/19/2024)  Housing: Unknown (05/19/2024)  Transportation Needs: No Transportation Needs (05/19/2024)  Utilities: Not At Risk (05/19/2024)  Tobacco Use: High Risk (05/21/2024)   SDOH Interventions:     Readmission Risk Interventions     No data to display

## 2024-05-22 NOTE — Plan of Care (Signed)

## 2024-05-23 DIAGNOSIS — F1910 Other psychoactive substance abuse, uncomplicated: Secondary | ICD-10-CM | POA: Diagnosis not present

## 2024-05-23 LAB — CBC WITH DIFFERENTIAL/PLATELET
Abs Immature Granulocytes: 0.02 K/uL (ref 0.00–0.07)
Basophils Absolute: 0 K/uL (ref 0.0–0.1)
Basophils Relative: 0 %
Eosinophils Absolute: 0 K/uL (ref 0.0–0.5)
Eosinophils Relative: 0 %
HCT: 46.5 % — ABNORMAL HIGH (ref 36.0–46.0)
Hemoglobin: 15.3 g/dL — ABNORMAL HIGH (ref 12.0–15.0)
Immature Granulocytes: 0 %
Lymphocytes Relative: 10 %
Lymphs Abs: 0.8 K/uL (ref 0.7–4.0)
MCH: 28.8 pg (ref 26.0–34.0)
MCHC: 32.9 g/dL (ref 30.0–36.0)
MCV: 87.4 fL (ref 80.0–100.0)
Monocytes Absolute: 0.9 K/uL (ref 0.1–1.0)
Monocytes Relative: 12 %
Neutro Abs: 6.1 K/uL (ref 1.7–7.7)
Neutrophils Relative %: 78 %
Platelets: 299 K/uL (ref 150–400)
RBC: 5.32 MIL/uL — ABNORMAL HIGH (ref 3.87–5.11)
RDW: 14 % (ref 11.5–15.5)
WBC: 7.8 K/uL (ref 4.0–10.5)
nRBC: 0 % (ref 0.0–0.2)

## 2024-05-23 LAB — BASIC METABOLIC PANEL WITH GFR
Anion gap: 14 (ref 5–15)
BUN: 76 mg/dL — ABNORMAL HIGH (ref 8–23)
CO2: 24 mmol/L (ref 22–32)
Calcium: 9.1 mg/dL (ref 8.9–10.3)
Chloride: 98 mmol/L (ref 98–111)
Creatinine, Ser: 2.17 mg/dL — ABNORMAL HIGH (ref 0.44–1.00)
GFR, Estimated: 25 mL/min — ABNORMAL LOW (ref >60)
Glucose, Bld: 97 mg/dL (ref 70–99)
Potassium: 3.9 mmol/L (ref 3.5–5.1)
Sodium: 136 mmol/L (ref 135–145)

## 2024-05-23 MED ORDER — ENOXAPARIN SODIUM 30 MG/0.3ML IJ SOSY
30.0000 mg | PREFILLED_SYRINGE | INTRAMUSCULAR | Status: DC
  Administered 2024-05-23: 30 mg via SUBCUTANEOUS
  Filled 2024-05-23: qty 0.3

## 2024-05-23 MED ORDER — SODIUM CHLORIDE 0.9 % IV SOLN: INTRAVENOUS | Status: AC

## 2024-05-23 MED ORDER — MORPHINE SULFATE (PF) 2 MG/ML IV SOLN
2.0000 mg | Freq: Once | INTRAVENOUS | Status: AC
  Administered 2024-05-23: 2 mg via INTRAVENOUS
  Filled 2024-05-23: qty 1

## 2024-05-23 NOTE — Progress Notes (Signed)
 Progress Note   Patient: Robin Mckinney FMW:[id number] DOB: 10-19-1960 DOA: 05/18/2024     4 DOS: the patient was seen and examined on 05/23/2024     Brief hospital course: From HPI Robin Mckinney is a 63 y.o. female with medical history significant of hypertension, CAD, GERD, PUD, and polysubstance abuse who presents to the ED complaining of nausea and vomiting.  Patient was sleeping but arousable, not willing to cooperate with me for interview.  History as per ED documentation Per EMS, patient called out from work with nausea and vomiting. Patient states that she feels nauseous but denies any abdominal pain or diarrhea, states that symptoms started today while she was at work. She was found to be significantly hypertensive and states she has not had her blood pressure medication for the past 24 hours after she ran out. She denies any chest pain, shortness of breath, headache, numbness, or weakness. She does admit to crack cocaine use earlier this morning before she went to work.   In the emergency department, she was hypertensive, somnolent but arousable, with nonfocal neurologic exam. No vomiting since arrival. Persistently hypertensive despite IV labetalol .  CT head without acute intracranial abnormality.  Urine has not been collected but on my exam pupils were pinpoint.  She was started on a nicardipine  drip for possible hypertensive encephalopathy.    Will give a dose of narcan  and admit to the hospitalist service for further evaluation/management of AMS    Assessment and Plan:    Acute toxic and metabolic encephalopathy - suspect related to substance abuse, either opioid intoxication or cocaine withdrawal. Admitted to cocaine use in triage.  CT head unremarkable.   Patient able to engage in some conversation Patient received trial of Narcan  U tox was positive for cocaine as well as cannabis Continue to monitor closely Counseled on concerning avoidance of illicit drugs    Hypertension, uncontrolled  Blood pressure improving Holding ACE inhibitor and hydrochlorothiazide     CAD  Continue asa, statin Holding ACE inhibitor and hydrochlorothiazide    DVT prophylaxis: Lovenox     Acute kidney injury secondary to nephrotoxic meds We will hold on lisinopril  and hydrochlorothiazide  Continue IV fluid    Advance Care Planning:   Status.  Full code       Subjective:  Patient seen and examined at bedside this morning Patient appears lethargic Patient denies nausea vomiting abdominal pain Renal function worsened   Physical Exam:   General: Elderly female laying in bed appears older than age in no acute distress HENT: Conjunctiva injected Cardiovascular:     Rate and Rhythm: Normal rate and regular rhythm.  Pulmonary:     Effort: Pulmonary effort is normal. No respiratory distress.     Breath sounds: Normal breath sounds. No wheezing.  Abdominal:     General: Bowel sounds are normal.  Musculoskeletal: No pain or extremities Neurological: Lethargic and awake     Data Reviewed:   Vitals:   05/22/24 0743 05/22/24 2137 05/23/24 0433 05/23/24 0722  BP: 120/81 111/88 (!) 150/94 119/79  Pulse: 96 94 86 84  Resp: 16 18 18 17   Temp: 98.7 F (37.1 C) 98 F (36.7 C) 98.1 F (36.7 C) 98.5 F (36.9 C)  TempSrc:   Oral   SpO2: 100% 98% 100% 100%  Weight:      Height:          Latest Ref Rng & Units 05/23/2024    5:12 AM 05/22/2024    5:12 AM 05/21/2024  4:13 AM  CBC  WBC 4.0 - 10.5 K/uL 7.8  9.9  11.5   Hemoglobin 12.0 - 15.0 g/dL 84.6  85.1  84.3   Hematocrit 36.0 - 46.0 % 46.5  45.0  46.2   Platelets 150 - 400 K/uL 299  273  278        Latest Ref Rng & Units 05/23/2024    5:12 AM 05/22/2024    5:12 AM 05/21/2024    4:13 AM  BMP  Glucose 70 - 99 mg/dL 97  899  888   BUN 8 - 23 mg/dL 76  63  41   Creatinine 0.44 - 1.00 mg/dL 7.82  8.27  8.96   Sodium 135 - 145 mmol/L 136  134  134   Potassium 3.5 - 5.1 mmol/L 3.9  3.4  3.8   Chloride 98  - 111 mmol/L 98  97  97   CO2 22 - 32 mmol/L 24  22  24    Calcium  8.9 - 10.3 mg/dL 9.1  9.5  9.8      Author: Drue ONEIDA Potter, MD 05/23/2024 2:12 PM  For on call review [email or web address].

## 2024-05-23 NOTE — Plan of Care (Signed)
 Pt alert; walk to BR to void; c/o heartburn Maalox given; vital stable. Encourage ensure, pt took 25%. Problem: Clinical Measurements: Goal: Ability to maintain clinical measurements within normal limits will improve Outcome: Progressing   Problem: Activity: Goal: Risk for activity intolerance will decrease Outcome: Progressing   Problem: Pain Managment: Goal: General experience of comfort will improve and/or be controlled Outcome: Progressing   Problem: Safety: Goal: Ability to remain free from injury will improve Outcome: Progressing   Problem: Skin Integrity: Goal: Risk for impaired skin integrity will decrease Outcome: Progressing

## 2024-05-23 NOTE — Plan of Care (Signed)
  Problem: Clinical Measurements: Goal: Ability to maintain clinical measurements within normal limits will improve Outcome: Progressing Goal: Will remain free from infection Outcome: Progressing Goal: Respiratory complications will improve Outcome: Progressing Goal: Cardiovascular complication will be avoided Outcome: Progressing   Problem: Elimination: Goal: Will not experience complications related to bowel motility Outcome: Progressing Goal: Will not experience complications related to urinary retention Outcome: Progressing   Problem: Pain Managment: Goal: General experience of comfort will improve and/or be controlled Outcome: Progressing   Problem: Safety: Goal: Ability to remain free from injury will improve Outcome: Progressing   Problem: Skin Integrity: Goal: Risk for impaired skin integrity will decrease Outcome: Progressing

## 2024-05-23 NOTE — Progress Notes (Signed)
 PHARMACIST - PHYSICIAN COMMUNICATION  CONCERNING:  Enoxaparin  (Lovenox ) for DVT Prophylaxis    RECOMMENDATION: Patient was prescribed enoxaprin 40mg  q24 hours for VTE prophylaxis.   Filed Weights   05/18/24 1336 05/20/24 1027  Weight: 65.8 kg (145 lb) 62.5 kg (137 lb 12.6 oz)    Body mass index is 24.41 kg/m.  Estimated Creatinine Clearance: 22 mL/min (A) (by C-G formula based on SCr of 2.17 mg/dL (H)).   Based on [facility] policy patient is candidate for enoxaparin  30mg  every 24 hours based on CrCl <55ml/min   DESCRIPTION: Pharmacy has adjusted enoxaparin  dose per [facility] policy.  Patient is now receiving enoxaparin  30 mg every 24 hours    Adriana JONETTA Bolster, PharmD Clinical Pharmacist  05/23/2024 8:35 AM

## 2024-05-24 DIAGNOSIS — F1910 Other psychoactive substance abuse, uncomplicated: Secondary | ICD-10-CM | POA: Diagnosis not present

## 2024-05-24 LAB — BASIC METABOLIC PANEL WITH GFR
Anion gap: 10 (ref 5–15)
BUN: 56 mg/dL — ABNORMAL HIGH (ref 8–23)
CO2: 26 mmol/L (ref 22–32)
Calcium: 8 mg/dL — ABNORMAL LOW (ref 8.9–10.3)
Chloride: 99 mmol/L (ref 98–111)
Creatinine, Ser: 1.34 mg/dL — ABNORMAL HIGH (ref 0.44–1.00)
GFR, Estimated: 45 mL/min — ABNORMAL LOW (ref >60)
Glucose, Bld: 89 mg/dL (ref 70–99)
Potassium: 3.3 mmol/L — ABNORMAL LOW (ref 3.5–5.1)
Sodium: 135 mmol/L (ref 135–145)

## 2024-05-24 MED ORDER — AMLODIPINE BESYLATE 5 MG PO TABS: 5.0000 mg | ORAL_TABLET | Freq: Every day | ORAL | 1 refills | Status: AC

## 2024-05-24 MED ORDER — POLYSACCHARIDE IRON COMPLEX 150 MG PO CAPS: 150.0000 mg | ORAL_CAPSULE | Freq: Every day | ORAL | 0 refills | Status: AC

## 2024-05-24 MED ORDER — ENOXAPARIN SODIUM 40 MG/0.4ML IJ SOSY: 40.0000 mg | PREFILLED_SYRINGE | INTRAMUSCULAR | Status: DC

## 2024-05-24 MED ORDER — AMLODIPINE BESYLATE 5 MG PO TABS
5.0000 mg | ORAL_TABLET | Freq: Every day | ORAL | Status: DC
  Administered 2024-05-24: 5 mg via ORAL
  Filled 2024-05-24: qty 1

## 2024-05-24 NOTE — Discharge Summary (Signed)
 Physician Discharge Summary   Patient: Robin Mckinney MRN: [id number] DOB: 01-25-1961  Admit date:     05/18/2024  Discharge date: 05/24/24  Discharge Physician: Drue ONEIDA Potter   PCP: [facility]   Recommendations at discharge:  Conselled on avoidance of substance use  Discharge Diagnoses:  Acute toxic and metabolic encephalopathy Hypertension, uncontrolled  CAD  Acute kidney injury secondary to nephrotoxic meds  Hospital Course: From HPI Robin Mckinney is a 63 y.o. female with medical history significant of hypertension, CAD, GERD, PUD, and polysubstance abuse who presents to the ED complaining of nausea and vomiting.  Patient was sleeping but arousable, not willing to cooperate with me for interview.  History as per ED documentation Per EMS, patient called out from work with nausea and vomiting. Patient states that she feels nauseous but denies any abdominal pain or diarrhea, states that symptoms started today while she was at work. She was found to be significantly hypertensive and states she has not had her blood pressure medication for the past 24 hours after she ran out. She denies any chest pain, shortness of breath, headache, numbness, or weakness. She does admit to crack cocaine use earlier this morning before she went to work.   In the emergency department, she was hypertensive, somnolent but arousable, with nonfocal neurologic exam. No vomiting since arrival. Persistently hypertensive despite IV labetalol .  CT head without acute intracranial abnormality.  Urine has not been collected but on my exam pupils were pinpoint.  She was started on a nicardipine  drip for possible hypertensive encephalopathy.    Will give a dose of narcan  and admit to the hospitalist service for further evaluation/management of AMS  Patient developed AKI likely in the setting of dehydration, uncontrolled hypertension as well as cocaine use disorder This improved with IV  fluid. Patient doing well moving about with no complaints. Have therefore been cleared for discharge today will follow-up with PCP   Consultants: None Procedures performed: None Disposition: Home Diet recommendation:  Cardiac diet DISCHARGE MEDICATION: Allergies as of 05/24/2024   No Known Allergies      Medication List     STOP taking these medications    lisinopril  20 MG tablet Commonly known as: ZESTRIL    nicotine  21 mg/24hr patch Commonly known as: NICODERM CQ  - dosed in mg/24 hours   polyethylene glycol 17 g packet Commonly known as: MIRALAX  / GLYCOLAX        TAKE these medications    amLODipine  5 MG tablet Commonly known as: NORVASC  Take 1 tablet (5 mg total) by mouth daily.   aspirin  EC 81 MG tablet Take 1 tablet (81 mg total) by mouth daily.   calcium  carbonate 500 MG chewable tablet Commonly known as: TUMS - dosed in mg elemental calcium  Chew 1-2 tablets (200-400 mg of elemental calcium  total) by mouth 3 (three) times daily as needed for indigestion or heartburn.   iron  polysaccharides 150 MG capsule Commonly known as: NIFEREX Take 1 capsule (150 mg total) by mouth daily.   omeprazole  40 MG capsule Commonly known as: PRILOSEC Take 1 capsule (40 mg total) by mouth in the morning and at bedtime.        Discharge Exam: Filed Weights   05/18/24 1336 05/20/24 1027  Weight: 65.8 kg 62.5 kg   General: Elderly female laying in bed appears older than age in no acute distress HENT: Conjunctiva injected Cardiovascular:     Rate and Rhythm: Normal rate and regular rhythm.  Pulmonary:  Effort: Pulmonary effort is normal. No respiratory distress.     Breath sounds: Normal breath sounds. No wheezing.  Abdominal:     General: Bowel sounds are normal.  Musculoskeletal: No pain or extremities Neurological: Alert and oriented x 3 Condition at discharge: good  The results of significant diagnostics from this hospitalization (including imaging,  microbiology, ancillary and laboratory) are listed below for reference.   Imaging Studies: MR BRAIN WO CONTRAST Result Date: 05/18/2024 CLINICAL DATA:  Initial evaluation for acute mental status change. EXAM: MRI HEAD WITHOUT CONTRAST TECHNIQUE: Multiplanar, multiecho pulse sequences of the brain and surrounding structures were obtained without intravenous contrast. COMPARISON:  CT from earlier the same day. FINDINGS: Brain: Examination moderately degraded by motion artifact. Cerebral volume within normal limits. Patchy T2/FLAIR hyperintensity involving the periventricular deep white matter, consistent with chronic small vessel ischemic disease, mild for age. No evidence for acute or subacute infarct. No areas of chronic cortical infarction. No acute or chronic intracranial blood products. No mass lesion, midline shift or mass effect no hydrocephalus or extra-axial fluid collection. Pituitary gland within normal limits. Vascular: Major intracranial vascular flow voids are maintained. Skull and upper cervical spine: Craniocervical junction within normal limits. Bone marrow signal intensity overall within normal limits. No scalp soft tissue abnormality. Sinuses/Orbits: Globes orbital soft tissues within normal limits. Paranasal sinuses are largely clear. No significant mastoid effusion. Other: None. IMPRESSION: 1. No acute intracranial abnormality. 2. Mild chronic microvascular ischemic disease for age. Electronically Signed   By: Morene Hoard M.D.   On: 05/18/2024 20:16   CT Head Wo Contrast Result Date: 05/18/2024 CLINICAL DATA:  Hypertensive. EXAM: CT HEAD WITHOUT CONTRAST TECHNIQUE: Contiguous axial images were obtained from the base of the skull through the vertex without intravenous contrast. RADIATION DOSE REDUCTION: This exam was performed according to the departmental dose-optimization program which includes automated exposure control, adjustment of the mA and/or kV according to patient size  and/or use of iterative reconstruction technique. COMPARISON:  November 08, 2023 FINDINGS: Brain: No evidence of acute infarction, hemorrhage, hydrocephalus, extra-axial collection or mass lesion/mass effect. Vascular: No hyperdense vessel or unexpected calcification. Skull: Normal. Negative for fracture or focal lesion. Sinuses/Orbits: Mild posterior left ethmoid sinus mucosal thickening is seen. Other: None. IMPRESSION: No acute intracranial abnormality. Electronically Signed   By: Suzen Dials M.D.   On: 05/18/2024 17:52    Microbiology: Results for orders placed or performed during the hospital encounter of 11/08/23  Urine Culture     Status: Abnormal   Collection Time: 11/08/23  8:02 AM   Specimen: Urine, Clean Catch  Result Value Ref Range Status   Specimen Description   Final    URINE, CLEAN CATCH Performed at [facility], [address] [place], [place] [address]    Special Requests   Final    NONE Performed at [facility], [address] [place], [place] [address]    Culture MULTIPLE SPECIES PRESENT, SUGGEST RECOLLECTION (A)  Final   Report Status 11/10/2023 FINAL  Final    Labs: CBC: Recent Labs  Lab 05/18/24 1343 05/20/24 0503 05/21/24 0413 05/22/24 0512 05/23/24 0512  WBC 6.0 13.9* 11.5* 9.9 7.8  NEUTROABS  --   --  9.6* 8.0* 6.1  HGB 13.6 15.9* 15.6* 14.8 15.3*  HCT 44.7 47.4* 46.2* 45.0 46.5*  MCV 94.3 86.0 86.7 86.9 87.4  PLT 277 296 278 273 299   Basic Metabolic Panel: Recent Labs  Lab 05/20/24 0503 05/21/24 0413 05/22/24 0512 05/23/24 0512 05/24/24  0811  NA 134* 134* 134* 136 135  K 3.5 3.8 3.4* 3.9 3.3*  CL 97* 97* 97* 98 99  CO2 25 24 22 24 26   GLUCOSE 129* 111* 100* 97 89  BUN 36* 41* 63* 76* 56*  CREATININE 0.95 1.03* 1.72* 2.17* 1.34*  CALCIUM  9.5 9.8 9.5 9.1 8.0*   Liver Function Tests: Recent Labs  Lab 05/18/24 1343  AST 32  ALT 16  ALKPHOS 82  BILITOT 0.6  PROT 8.5*  ALBUMIN 4.5   CBG: No results  for input(s): GLUCAP in the last 168 hours.  Discharge time spent:  36 minutes.  Signed: Drue ONEIDA Potter, MD [facility] 05/24/2024

## 2024-05-24 NOTE — Plan of Care (Signed)
  Problem: Clinical Measurements: Goal: Ability to maintain clinical measurements within normal limits will improve Outcome: Progressing   Problem: Activity: Goal: Risk for activity intolerance will decrease Outcome: Progressing   Problem: Elimination: Goal: Will not experience complications related to bowel motility Outcome: Progressing   Problem: Safety: Goal: Ability to remain free from injury will improve Outcome: Progressing   Problem: Pain Managment: Goal: General experience of comfort will improve and/or be controlled Outcome: Progressing

## 2024-05-25 LAB — BLOOD GAS, VENOUS
Bicarbonate: 30.4 mmol/L — ABNORMAL HIGH (ref 20.0–28.0)
Patient temperature: 37 mmol/L — AB (ref 0.0–2.0)
pCO2, Ven: 48 mmHg (ref 44–60)
pH, Ven: 7.41 (ref 7.25–7.43)
pO2, Ven: 30.4 mmol/L — AB (ref 32–45)
